# Patient Record
Sex: Female | Born: 1965
Health system: Southern US, Community
[De-identification: ages and names within clinical notes are randomized; demographics above are authoritative.]

## PROBLEM LIST (undated history)

## (undated) DIAGNOSIS — I10 Essential (primary) hypertension: Secondary | ICD-10-CM

## (undated) DIAGNOSIS — B20 Human immunodeficiency virus [HIV] disease: Secondary | ICD-10-CM

## (undated) DIAGNOSIS — C50919 Malignant neoplasm of unspecified site of unspecified female breast: Secondary | ICD-10-CM

## (undated) DIAGNOSIS — T7840XA Allergy, unspecified, initial encounter: Secondary | ICD-10-CM

## (undated) DIAGNOSIS — F1910 Other psychoactive substance abuse, uncomplicated: Secondary | ICD-10-CM

## (undated) DIAGNOSIS — E785 Hyperlipidemia, unspecified: Secondary | ICD-10-CM

## (undated) DIAGNOSIS — N879 Dysplasia of cervix uteri, unspecified: Secondary | ICD-10-CM

## (undated) DIAGNOSIS — D219 Benign neoplasm of connective and other soft tissue, unspecified: Secondary | ICD-10-CM

## (undated) DIAGNOSIS — G4730 Sleep apnea, unspecified: Secondary | ICD-10-CM

## (undated) DIAGNOSIS — G4700 Insomnia, unspecified: Secondary | ICD-10-CM

## (undated) DIAGNOSIS — K219 Gastro-esophageal reflux disease without esophagitis: Secondary | ICD-10-CM

## (undated) DIAGNOSIS — Z21 Asymptomatic human immunodeficiency virus [HIV] infection status: Secondary | ICD-10-CM

## (undated) DIAGNOSIS — N750 Cyst of Bartholin's gland: Secondary | ICD-10-CM

## (undated) DIAGNOSIS — K2270 Barrett's esophagus without dysplasia: Secondary | ICD-10-CM

## (undated) DIAGNOSIS — K589 Irritable bowel syndrome without diarrhea: Secondary | ICD-10-CM

## (undated) HISTORY — DX: Barrett's esophagus without dysplasia: K22.70

## (undated) HISTORY — DX: Gastro-esophageal reflux disease without esophagitis: K21.9

## (undated) HISTORY — PX: TUBAL LIGATION: SHX77

## (undated) HISTORY — DX: Dysplasia of cervix uteri, unspecified: N87.9

## (undated) HISTORY — DX: Benign neoplasm of connective and other soft tissue, unspecified: D21.9

## (undated) HISTORY — DX: Human immunodeficiency virus (HIV) disease: B20

## (undated) HISTORY — DX: Asymptomatic human immunodeficiency virus (hiv) infection status: Z21

## (undated) HISTORY — DX: Essential (primary) hypertension: I10

## (undated) HISTORY — DX: Cyst of Bartholin's gland: N75.0

## (undated) HISTORY — DX: Insomnia, unspecified: G47.00

## (undated) HISTORY — PX: ANAL SPHINCTEROTOMY: SHX1140

## (undated) HISTORY — PX: APPENDECTOMY: SHX54

## (undated) HISTORY — DX: Other psychoactive substance abuse, uncomplicated: F19.10

## (undated) HISTORY — DX: Allergy, unspecified, initial encounter: T78.40XA

## (undated) HISTORY — DX: Irritable bowel syndrome, unspecified: K58.9

## (undated) HISTORY — DX: Hyperlipidemia, unspecified: E78.5

## (undated) HISTORY — DX: Sleep apnea, unspecified: G47.30

---

## 1988-01-27 HISTORY — PX: CHOLECYSTECTOMY: SHX55

## 1998-01-26 HISTORY — PX: LEEP: SHX91

## 1998-11-28 ENCOUNTER — Emergency Department (HOSPITAL_COMMUNITY): Admission: EM | Admit: 1998-11-28 | Discharge: 1998-11-28 | Payer: Self-pay | Admitting: Emergency Medicine

## 2002-09-27 ENCOUNTER — Other Ambulatory Visit: Admission: RE | Admit: 2002-09-27 | Discharge: 2002-09-27 | Payer: Self-pay | Admitting: Family Medicine

## 2002-09-27 ENCOUNTER — Other Ambulatory Visit: Admission: RE | Admit: 2002-09-27 | Discharge: 2002-09-27 | Payer: Self-pay | Admitting: *Deleted

## 2003-01-27 DIAGNOSIS — N879 Dysplasia of cervix uteri, unspecified: Secondary | ICD-10-CM

## 2003-01-27 HISTORY — DX: Dysplasia of cervix uteri, unspecified: N87.9

## 2003-04-10 ENCOUNTER — Other Ambulatory Visit: Admission: RE | Admit: 2003-04-10 | Discharge: 2003-04-10 | Payer: Self-pay | Admitting: Obstetrics and Gynecology

## 2003-09-27 HISTORY — PX: ABDOMINAL HYSTERECTOMY: SHX81

## 2003-10-09 ENCOUNTER — Observation Stay (HOSPITAL_COMMUNITY): Admission: RE | Admit: 2003-10-09 | Discharge: 2003-10-10 | Payer: Self-pay | Admitting: Obstetrics and Gynecology

## 2003-10-09 ENCOUNTER — Encounter (INDEPENDENT_AMBULATORY_CARE_PROVIDER_SITE_OTHER): Payer: Self-pay | Admitting: Specialist

## 2003-10-09 ENCOUNTER — Encounter: Payer: Self-pay | Admitting: Infectious Diseases

## 2003-10-09 ENCOUNTER — Encounter (INDEPENDENT_AMBULATORY_CARE_PROVIDER_SITE_OTHER): Payer: Self-pay | Admitting: *Deleted

## 2004-03-28 ENCOUNTER — Encounter (INDEPENDENT_AMBULATORY_CARE_PROVIDER_SITE_OTHER): Payer: Self-pay | Admitting: *Deleted

## 2004-03-28 LAB — CONVERTED CEMR LAB: CD4 Count: 471 microliters

## 2004-04-22 ENCOUNTER — Ambulatory Visit (HOSPITAL_COMMUNITY): Admission: RE | Admit: 2004-04-22 | Discharge: 2004-04-22 | Payer: Self-pay | Admitting: Infectious Diseases

## 2004-04-22 ENCOUNTER — Ambulatory Visit: Payer: Self-pay | Admitting: Infectious Diseases

## 2004-05-15 ENCOUNTER — Ambulatory Visit: Payer: Self-pay | Admitting: Infectious Diseases

## 2004-05-21 ENCOUNTER — Other Ambulatory Visit: Admission: RE | Admit: 2004-05-21 | Discharge: 2004-05-21 | Payer: Self-pay | Admitting: Obstetrics and Gynecology

## 2004-10-17 ENCOUNTER — Ambulatory Visit: Payer: Self-pay | Admitting: Infectious Diseases

## 2004-11-17 ENCOUNTER — Ambulatory Visit: Payer: Self-pay | Admitting: Infectious Diseases

## 2004-11-17 ENCOUNTER — Ambulatory Visit (HOSPITAL_COMMUNITY): Admission: RE | Admit: 2004-11-17 | Discharge: 2004-11-17 | Payer: Self-pay | Admitting: Infectious Diseases

## 2004-11-17 ENCOUNTER — Encounter (INDEPENDENT_AMBULATORY_CARE_PROVIDER_SITE_OTHER): Payer: Self-pay | Admitting: *Deleted

## 2004-11-17 LAB — CONVERTED CEMR LAB
CD4 Count: 360 microliters
HIV 1 RNA Quant: 399 copies/mL

## 2004-12-15 ENCOUNTER — Ambulatory Visit: Payer: Self-pay | Admitting: Infectious Diseases

## 2005-03-19 ENCOUNTER — Encounter (INDEPENDENT_AMBULATORY_CARE_PROVIDER_SITE_OTHER): Payer: Self-pay | Admitting: *Deleted

## 2005-03-19 ENCOUNTER — Ambulatory Visit: Payer: Self-pay | Admitting: Infectious Diseases

## 2005-04-09 ENCOUNTER — Ambulatory Visit: Payer: Self-pay | Admitting: Infectious Diseases

## 2005-10-07 ENCOUNTER — Encounter: Admission: RE | Admit: 2005-10-07 | Discharge: 2005-10-07 | Payer: Self-pay | Admitting: Internal Medicine

## 2005-10-07 ENCOUNTER — Ambulatory Visit: Payer: Self-pay | Admitting: Internal Medicine

## 2005-10-07 ENCOUNTER — Encounter (INDEPENDENT_AMBULATORY_CARE_PROVIDER_SITE_OTHER): Payer: Self-pay | Admitting: *Deleted

## 2005-10-07 LAB — CONVERTED CEMR LAB
CD4 Count: 480 microliters
HIV 1 RNA Quant: 49 copies/mL

## 2005-10-22 ENCOUNTER — Ambulatory Visit: Payer: Self-pay | Admitting: Infectious Diseases

## 2005-12-05 DIAGNOSIS — B20 Human immunodeficiency virus [HIV] disease: Secondary | ICD-10-CM | POA: Insufficient documentation

## 2005-12-05 DIAGNOSIS — I10 Essential (primary) hypertension: Secondary | ICD-10-CM | POA: Insufficient documentation

## 2006-03-22 ENCOUNTER — Encounter (INDEPENDENT_AMBULATORY_CARE_PROVIDER_SITE_OTHER): Payer: Self-pay | Admitting: *Deleted

## 2006-03-22 LAB — CONVERTED CEMR LAB

## 2006-04-04 ENCOUNTER — Encounter (INDEPENDENT_AMBULATORY_CARE_PROVIDER_SITE_OTHER): Payer: Self-pay | Admitting: *Deleted

## 2006-04-08 ENCOUNTER — Telehealth: Payer: Self-pay | Admitting: Infectious Diseases

## 2006-05-12 ENCOUNTER — Encounter (INDEPENDENT_AMBULATORY_CARE_PROVIDER_SITE_OTHER): Payer: Self-pay | Admitting: *Deleted

## 2006-05-12 ENCOUNTER — Ambulatory Visit: Payer: Self-pay | Admitting: Infectious Diseases

## 2006-05-12 ENCOUNTER — Encounter: Admission: RE | Admit: 2006-05-12 | Discharge: 2006-05-12 | Payer: Self-pay | Admitting: Infectious Diseases

## 2006-05-12 LAB — CONVERTED CEMR LAB
ALT: 15 units/L (ref 0–35)
AST: 21 units/L (ref 0–37)
Basophils Absolute: 0 10*3/uL (ref 0.0–0.1)
Basophils Relative: 0 % (ref 0–1)
CD4 Count: 570 microliters
Calcium: 8.8 mg/dL (ref 8.4–10.5)
Chloride: 104 meq/L (ref 96–112)
Creatinine, Ser: 0.76 mg/dL (ref 0.40–1.20)
Hemoglobin: 13.1 g/dL (ref 12.0–15.0)
MCHC: 33.4 g/dL (ref 30.0–36.0)
Monocytes Absolute: 0.4 10*3/uL (ref 0.2–0.7)
Neutro Abs: 3 10*3/uL (ref 1.7–7.7)
Platelets: 218 10*3/uL (ref 150–400)
RDW: 13.6 % (ref 11.5–14.0)
Total Bilirubin: 0.2 mg/dL — ABNORMAL LOW (ref 0.3–1.2)

## 2006-05-27 ENCOUNTER — Ambulatory Visit: Payer: Self-pay | Admitting: Infectious Diseases

## 2006-06-01 ENCOUNTER — Telehealth: Payer: Self-pay | Admitting: Infectious Diseases

## 2006-06-04 ENCOUNTER — Ambulatory Visit: Payer: Self-pay | Admitting: Internal Medicine

## 2006-06-04 DIAGNOSIS — R7989 Other specified abnormal findings of blood chemistry: Secondary | ICD-10-CM | POA: Insufficient documentation

## 2006-06-16 ENCOUNTER — Ambulatory Visit (HOSPITAL_COMMUNITY): Admission: RE | Admit: 2006-06-16 | Discharge: 2006-06-16 | Payer: Self-pay | Admitting: Internal Medicine

## 2006-06-16 ENCOUNTER — Encounter: Payer: Self-pay | Admitting: Infectious Diseases

## 2006-06-16 ENCOUNTER — Ambulatory Visit: Payer: Self-pay | Admitting: Internal Medicine

## 2006-06-16 DIAGNOSIS — N644 Mastodynia: Secondary | ICD-10-CM | POA: Insufficient documentation

## 2006-06-16 DIAGNOSIS — R42 Dizziness and giddiness: Secondary | ICD-10-CM | POA: Insufficient documentation

## 2006-06-16 DIAGNOSIS — R079 Chest pain, unspecified: Secondary | ICD-10-CM | POA: Insufficient documentation

## 2006-09-13 ENCOUNTER — Emergency Department (HOSPITAL_COMMUNITY): Admission: EM | Admit: 2006-09-13 | Discharge: 2006-09-13 | Payer: Self-pay | Admitting: Emergency Medicine

## 2006-10-01 ENCOUNTER — Telehealth: Payer: Self-pay | Admitting: Infectious Diseases

## 2006-10-12 ENCOUNTER — Telehealth: Payer: Self-pay | Admitting: Infectious Diseases

## 2006-11-11 ENCOUNTER — Telehealth: Payer: Self-pay | Admitting: Infectious Diseases

## 2006-12-02 ENCOUNTER — Ambulatory Visit (HOSPITAL_BASED_OUTPATIENT_CLINIC_OR_DEPARTMENT_OTHER): Admission: RE | Admit: 2006-12-02 | Discharge: 2006-12-02 | Payer: Self-pay | Admitting: General Surgery

## 2006-12-13 ENCOUNTER — Telehealth: Payer: Self-pay | Admitting: Internal Medicine

## 2007-01-25 ENCOUNTER — Encounter (INDEPENDENT_AMBULATORY_CARE_PROVIDER_SITE_OTHER): Payer: Self-pay | Admitting: *Deleted

## 2007-01-25 ENCOUNTER — Encounter: Payer: Self-pay | Admitting: Infectious Diseases

## 2007-01-27 HISTORY — PX: COLONOSCOPY: SHX174

## 2007-03-24 ENCOUNTER — Encounter: Admission: RE | Admit: 2007-03-24 | Discharge: 2007-03-24 | Payer: Self-pay | Admitting: Infectious Diseases

## 2007-03-24 ENCOUNTER — Ambulatory Visit: Payer: Self-pay | Admitting: Infectious Diseases

## 2007-03-24 LAB — CONVERTED CEMR LAB
ALT: 18 units/L (ref 0–35)
Alkaline Phosphatase: 131 units/L — ABNORMAL HIGH (ref 39–117)
Basophils Absolute: 0 10*3/uL (ref 0.0–0.1)
Basophils Relative: 1 % (ref 0–1)
Bilirubin Urine: NEGATIVE
CO2: 26 meq/L (ref 19–32)
Chlamydia, Swab/Urine, PCR: NEGATIVE
Cholesterol: 228 mg/dL — ABNORMAL HIGH (ref 0–200)
Creatinine, Ser: 0.62 mg/dL (ref 0.40–1.20)
Eosinophils Absolute: 0.2 10*3/uL (ref 0.0–0.7)
Eosinophils Relative: 2 % (ref 0–5)
HIV 1 RNA Quant: <50 copies/mL (ref <50)
HIV-1 RNA Quant, Log: <1.7 (ref <1.70)
Hemoglobin, Urine: NEGATIVE
LDL Cholesterol: 154 mg/dL — ABNORMAL HIGH (ref 0–99)
Lymphocytes Relative: 34 % (ref 12–46)
Lymphs Abs: 2.8 10*3/uL (ref 0.7–4.0)
Monocytes Relative: 4 % (ref 3–12)
Neutro Abs: 5 10*3/uL (ref 1.7–7.7)
Neutrophils Relative %: 60 % (ref 43–77)
Protein, ur: NEGATIVE mg/dL
RBC: 4.28 M/uL (ref 3.87–5.11)
Sodium: 141 meq/L (ref 135–145)
Total Bilirubin: 0.3 mg/dL (ref 0.3–1.2)
Total CHOL/HDL Ratio: 5.1
Total Protein: 7.3 g/dL (ref 6.0–8.3)
Urine Glucose: NEGATIVE mg/dL
Urobilinogen, UA: 0.2 (ref 0.0–1.0)
VLDL: 29 mg/dL (ref 0–40)
WBC: 8.3 10*3/uL (ref 4.0–10.5)

## 2007-04-07 ENCOUNTER — Ambulatory Visit: Payer: Self-pay | Admitting: Infectious Diseases

## 2007-04-07 DIAGNOSIS — F4323 Adjustment disorder with mixed anxiety and depressed mood: Secondary | ICD-10-CM | POA: Insufficient documentation

## 2007-04-08 ENCOUNTER — Telehealth: Payer: Self-pay | Admitting: Licensed Clinical Social Worker

## 2007-04-11 ENCOUNTER — Encounter: Payer: Self-pay | Admitting: Licensed Clinical Social Worker

## 2007-06-14 ENCOUNTER — Encounter (INDEPENDENT_AMBULATORY_CARE_PROVIDER_SITE_OTHER): Payer: Self-pay | Admitting: *Deleted

## 2007-10-20 ENCOUNTER — Ambulatory Visit: Payer: Self-pay | Admitting: Infectious Diseases

## 2007-10-20 LAB — CONVERTED CEMR LAB
ALT: 11 units/L (ref 0–35)
Albumin: 4.3 g/dL (ref 3.5–5.2)
Basophils Absolute: 0 10*3/uL (ref 0.0–0.1)
Blood Glucose, Fingerstick: 96
CO2: 27 meq/L (ref 19–32)
Calcium: 9.4 mg/dL (ref 8.4–10.5)
Chloride: 103 meq/L (ref 96–112)
Cholesterol: 225 mg/dL — ABNORMAL HIGH (ref 0–200)
HIV-1 RNA Quant, Log: <1.7 (ref <1.70)
Hgb A1c MFr Bld: 5.9 %
Lymphocytes Relative: 37 % (ref 12–46)
Lymphs Abs: 3 10*3/uL (ref 0.7–4.0)
Neutro Abs: 4.5 10*3/uL (ref 1.7–7.7)
Platelets: 246 10*3/uL (ref 150–400)
Potassium: 3.9 meq/L (ref 3.5–5.3)
RDW: 13 % (ref 11.5–15.5)
Sodium: 139 meq/L (ref 135–145)
Total Protein: 7.5 g/dL (ref 6.0–8.3)
VLDL: 15 mg/dL (ref 0–40)
WBC: 8.1 10*3/uL (ref 4.0–10.5)

## 2008-03-07 ENCOUNTER — Ambulatory Visit: Payer: Self-pay | Admitting: Infectious Diseases

## 2008-03-07 LAB — CONVERTED CEMR LAB
Albumin: 4.2 g/dL (ref 3.5–5.2)
CO2: 23 meq/L (ref 19–32)
Eosinophils Relative: 1 % (ref 0–5)
Glucose, Bld: 97 mg/dL (ref 70–99)
HCT: 38.1 % (ref 36.0–46.0)
HIV 1 RNA Quant: <48 copies/mL (ref <48)
Hemoglobin: 12.3 g/dL (ref 12.0–15.0)
Lymphocytes Relative: 35 % (ref 12–46)
Lymphs Abs: 4 10*3/uL (ref 0.7–4.0)
Platelets: 229 10*3/uL (ref 150–400)
Potassium: 3.6 meq/L (ref 3.5–5.3)
RBC: 4.05 M/uL (ref 3.87–5.11)
Sodium: 139 meq/L (ref 135–145)
Total Bilirubin: 0.3 mg/dL (ref 0.3–1.2)
Total Protein: 7.4 g/dL (ref 6.0–8.3)
WBC: 11.5 10*3/uL — ABNORMAL HIGH (ref 4.0–10.5)

## 2008-03-21 ENCOUNTER — Encounter (INDEPENDENT_AMBULATORY_CARE_PROVIDER_SITE_OTHER): Payer: Self-pay | Admitting: *Deleted

## 2008-03-22 ENCOUNTER — Ambulatory Visit: Payer: Self-pay | Admitting: Infectious Diseases

## 2008-09-06 ENCOUNTER — Encounter: Admission: RE | Admit: 2008-09-06 | Discharge: 2008-09-06 | Payer: Self-pay | Admitting: Obstetrics and Gynecology

## 2008-12-27 ENCOUNTER — Ambulatory Visit: Payer: Self-pay | Admitting: Infectious Diseases

## 2008-12-27 LAB — CONVERTED CEMR LAB
BUN: 10 mg/dL (ref 6–23)
CO2: 26 meq/L (ref 19–32)
Calcium: 9.1 mg/dL (ref 8.4–10.5)
Chloride: 104 meq/L (ref 96–112)
Cholesterol: 254 mg/dL — ABNORMAL HIGH (ref 0–200)
Creatinine, Ser: 0.93 mg/dL (ref 0.40–1.20)
Eosinophils Relative: 2 % (ref 0–5)
Glucose, Bld: 97 mg/dL (ref 70–99)
HCT: 38.4 % (ref 36.0–46.0)
HDL: 36 mg/dL — ABNORMAL LOW (ref >39)
Hemoglobin: 13.2 g/dL (ref 12.0–15.0)
Hgb A1c MFr Bld: 5.3 %
LDL Cholesterol: 178 mg/dL — ABNORMAL HIGH (ref 0–99)
Lymphocytes Relative: 44 % (ref 12–46)
Lymphs Abs: 3.8 10*3/uL (ref 0.7–4.0)
Monocytes Absolute: 0.5 10*3/uL (ref 0.1–1.0)
Triglycerides: 202 mg/dL — ABNORMAL HIGH (ref <150)
VLDL: 40 mg/dL (ref 0–40)
WBC: 8.7 10*3/uL (ref 4.0–10.5)

## 2009-02-07 ENCOUNTER — Ambulatory Visit: Payer: Self-pay | Admitting: Infectious Diseases

## 2009-02-07 DIAGNOSIS — E785 Hyperlipidemia, unspecified: Secondary | ICD-10-CM | POA: Insufficient documentation

## 2009-03-18 ENCOUNTER — Encounter: Payer: Self-pay | Admitting: Infectious Diseases

## 2009-04-08 ENCOUNTER — Telehealth (INDEPENDENT_AMBULATORY_CARE_PROVIDER_SITE_OTHER): Payer: Self-pay | Admitting: *Deleted

## 2009-08-26 ENCOUNTER — Telehealth (INDEPENDENT_AMBULATORY_CARE_PROVIDER_SITE_OTHER): Payer: Self-pay | Admitting: *Deleted

## 2009-09-16 ENCOUNTER — Ambulatory Visit: Payer: Self-pay | Admitting: Infectious Diseases

## 2009-09-16 LAB — CONVERTED CEMR LAB
AST: 24 units/L (ref 0–37)
Alkaline Phosphatase: 115 units/L (ref 39–117)
BUN: 8 mg/dL (ref 6–23)
Basophils Relative: 0 % (ref 0–1)
Creatinine, Ser: 0.7 mg/dL (ref 0.40–1.20)
Eosinophils Absolute: 0.3 10*3/uL (ref 0.0–0.7)
Eosinophils Relative: 2 % (ref 0–5)
Glucose, Bld: 106 mg/dL — ABNORMAL HIGH (ref 70–99)
HCT: 39.8 % (ref 36.0–46.0)
HIV-1 RNA Quant, Log: <1.68 (ref <1.68)
MCHC: 33.2 g/dL (ref 30.0–36.0)
MCV: 94.1 fL (ref 78.0–100.0)
Monocytes Relative: 5 % (ref 3–12)
Neutrophils Relative %: 60 % (ref 43–77)
Potassium: 3.9 meq/L (ref 3.5–5.3)
RBC: 4.23 M/uL (ref 3.87–5.11)
Total Bilirubin: 0.2 mg/dL — ABNORMAL LOW (ref 0.3–1.2)

## 2009-10-03 ENCOUNTER — Ambulatory Visit: Payer: Self-pay | Admitting: Infectious Diseases

## 2009-10-03 LAB — CONVERTED CEMR LAB
Cholesterol: 192 mg/dL (ref 0–200)
HDL: 42 mg/dL (ref >39)
Total CHOL/HDL Ratio: 4.6
Triglycerides: 199 mg/dL — ABNORMAL HIGH (ref <150)
VLDL: 40 mg/dL (ref 0–40)

## 2009-10-24 ENCOUNTER — Encounter: Payer: Self-pay | Admitting: Infectious Diseases

## 2010-02-27 NOTE — Progress Notes (Signed)
Summary: cold symptoms x 1 week  Phone Note Call from Patient Call back at Work Phone [phone]   Caller: Patient Reason for Call: Acute Illness Summary of Call: Sick x 1 week.  Started out with nasal congestion and slight fever.  Went out-of-town on 04/02/09 on an airplane.  Pt. now has copius clear to yellowish nasal drainage, productive cough (clear to yellow sputum), no fever and no sore throat.  Has tried several OTC multisymptom products.   Pt. is still out-of-town, coming home on airplane tomorrow.  RN advised 1) Robitussin for cough, 2) OTC claritin for nasal symptoms and 3) Saline Nasal spray for nasal symptoms.  Pt. verbalized understanding. Jennet Maduro RN  April 08, 2009 4:02 PM  Initial call taken by: Jennet Maduro RN,  April 08, 2009 3:59 PM

## 2010-02-27 NOTE — Progress Notes (Signed)
Summary: refill request for Ambien  Phone Note Call from Patient   Caller: Patient Reason for Call: Refill Medication Summary of Call: Patient called requesting refill for Ambien.  Please call into [facility] on [place], [place]. Initial call taken by: Paulo Fruit  BS,CPht II,MPH,  August 26, 2009 12:08 PM    Prescriptions: AMBIEN CR 12.5 MG TBCR (ZOLPIDEM TARTRATE) Take 1 tablet by mouth at bedtime  #30 x 1   Entered by:   Paulo Fruit  BS,CPht II,MPH   Authorized by:   [name] MD   Signed by:   Paulo Fruit  BS,CPht II,MPH on 08/26/2009   Method used:   Telephoned to ...       [facility] [address] #[id number]* (retail)       [address] [place], [place]  [address]       Ph: [phone]       Fax: [phone]   RxID:   [id number]  Left message of approval on pharmacy physician line. Paulo Fruit  BS,CPht II,MPH  August 26, 2009 12:41 PM

## 2010-02-27 NOTE — Letter (Signed)
Summary: [employer]: Review  [employer]: Review   Imported By: Florinda Marker 04/05/2009 15:07:04  _____________________________________________________________________  External Attachment:    Type:   Image     Comment:   External Document

## 2010-02-27 NOTE — Medication Information (Signed)
Summary: [facility]   Imported By: Florinda Marker 10/30/2009 14:16:12  _____________________________________________________________________  External Attachment:    Type:   Image     Comment:   External Document

## 2010-02-27 NOTE — Assessment & Plan Note (Signed)
Summary: Amy Hernandez   CC:  f/u labs.  Preventive Screening-Counseling & Management  Alcohol-Tobacco     Smoking Status: current     Packs/Day: 1/2   Current Allergies (reviewed today): ! CODEINE Additional History Menstrual Status:  hysterectomy  Vital Signs:  Patient profile:   45 year old female Menstrual status:  hysterectomy Height:      62 inches (157.48 cm) Weight:      192.50 pounds (87.50 kg) BMI:     35.34 Temp:     98.2 degrees F (36.78 degrees C) Pulse rate:   77 / minute BP sitting:   142 / 90  (left arm)  Vitals Entered By: Starleen Arms CMA (February 07, 2009 12:08 PM) CC: f/u labs Is Patient Diabetic? No Pain Assessment Patient in pain? no      Nutritional Status BMI of > 30 = obese Nutritional Status Detail nl  Does patient need assistance? Functional Status Self care Ambulation Normal     Menstrual Status hysterectomy Last PAP Result hysterectomy due to CIN 1 and fibroids    Medications Added to Medication List This Visit: 1)  Proair Hfa 108 (90 Base) Mcg/act Aers (Albuterol sulfate) .... Use two times a day 2 puffs prn 2)  Simvastatin 20 Mg Tabs (Simvastatin) .... Take 1 tablet by mouth at bedtime  Other Orders: Future Orders: T-CBC w/Diff ([id number]) ... 06/07/2009 T-CD4SP ([facility]) (CD4SP) ... 06/07/2009 T-Comprehensive Metabolic Panel [id number]) ... 06/07/2009 T-HIV Viral Load [phone]) ... 06/07/2009  Patient Instructions: 1)  Please schedule a follow-up appointment in 3-4 months. 2)  Be sure to return for lab work one (2) week before your next appointment as scheduled. Prescriptions: SIMVASTATIN 20 MG TABS (SIMVASTATIN) Take 1 tablet by mouth at bedtime  #31 x prn   Entered by:   Jennet Maduro RN   Authorized by:   [name] MD   Signed by:   Jennet Maduro RN on 04/04/2009   Method used:   Telephoned to ...       [facility] [address] #[id number]* (retail)       [address] [place], [place]  [address]    Ph: [phone]       Fax: [phone]   RxID:   [id number] LISINOPRIL-HYDROCHLOROTHIAZIDE 20-25 MG TABS (LISINOPRIL-HYDROCHLOROTHIAZIDE) Take 1 tablet by mouth once a day  #30 Tablet x 6   Entered by:   Starleen Arms CMA   Authorized by:   [name] MD   Signed by:   Starleen Arms CMA on 02/07/2009   Method used:   Telephoned to ...       [facility] [address] #[id number]* (retail)       [address] [place], [place]  [address]       Ph: [phone]       Fax: [phone]   RxID:   [id number] ATRIPLA 600-200-300 MG TABS (EFAVIRENZ-EMTRICITAB-TENOFOVIR) Take 1 tablet by mouth at bedtime  #30.0 Each x 12   Entered by:   Starleen Arms CMA   Authorized by:   [name] MD   Signed by:   Starleen Arms CMA on 02/07/2009   Method used:   Telephoned to ...       [facility] [address] #[id number]* (retail)       [address] [place], [place]  [address]       Ph: [phone]  Fax: [phone]   RxID:   [id number] AMBIEN CR 12.5 MG TBCR (ZOLPIDEM TARTRATE) Take 1 tablet by mouth at bedtime  #30 x 2   Entered by:   Starleen Arms CMA   Authorized by:   [name] MD   Signed by:   Starleen Arms CMA on 02/07/2009   Method used:   Telephoned to ...       [facility] [address] #[id number]* (retail)       [address] [place], [place]  [address]       Ph: [phone]       Fax: [phone]   RxID:   [id number] PROAIR HFA 108 (90 BASE) MCG/ACT AERS (ALBUTEROL SULFATE) use two times a day 2 puffs prn  #1 x 11   Entered by:   Starleen Arms CMA   Authorized by:   [name] MD   Signed by:   Starleen Arms CMA on 02/07/2009   Method used:   Electronically to        [facility] #[id number]* (retail)       [address] [place], [place]  [address]       Ph: [phone]       Fax: [phone]   RxID:   [id number]  Process Orders Check Orders Results:     [facility]: ABN not required  for this insurance Tests Sent for requisitioning (April 18, 2009 2:12 PM):     06/07/2009: [facility] -- T-CBC w/Diff [[id number]] (signed)     06/07/2009: [facility] -- T-Comprehensive Metabolic Panel [[id number]] (signed)     06/07/2009: [facility] -- T-HIV Viral Load [phone] (signed)   Process Orders Check Orders Results:     [facility]: ABN not required for this insurance Tests Sent for requisitioning (April 18, 2009 2:12 PM):     06/07/2009: [facility] -- T-CBC w/Diff [[id number]] (signed)     06/07/2009: [facility] -- T-Comprehensive Metabolic Panel [[id number]] (signed)     06/07/2009: [facility] -- T-HIV Viral Load [phone] (signed)

## 2010-04-01 ENCOUNTER — Other Ambulatory Visit: Payer: Self-pay | Admitting: Infectious Diseases

## 2010-04-01 ENCOUNTER — Encounter: Payer: Self-pay | Admitting: Infectious Diseases

## 2010-04-01 ENCOUNTER — Other Ambulatory Visit (INDEPENDENT_AMBULATORY_CARE_PROVIDER_SITE_OTHER): Payer: Managed Care, Other (non HMO)

## 2010-04-01 DIAGNOSIS — B20 Human immunodeficiency virus [HIV] disease: Secondary | ICD-10-CM

## 2010-04-01 LAB — CONVERTED CEMR LAB
Albumin: 4.4 g/dL (ref 3.5–5.2)
BUN: 10 mg/dL (ref 6–23)
Basophils Absolute: 0 10*3/uL (ref 0.0–0.1)
Basophils Relative: 0 % (ref 0–1)
Calcium: 9.3 mg/dL (ref 8.4–10.5)
Chloride: 98 meq/L (ref 96–112)
Glucose, Bld: 107 mg/dL — ABNORMAL HIGH (ref 70–99)
HIV-1 RNA Quant, Log: <1.3 (ref <1.30)
Hemoglobin: 13.6 g/dL (ref 12.0–15.0)
Lymphocytes Relative: 38 % (ref 12–46)
MCHC: 33.9 g/dL (ref 30.0–36.0)
Monocytes Absolute: 0.5 10*3/uL (ref 0.1–1.0)
Monocytes Relative: 5 % (ref 3–12)
Neutro Abs: 5 10*3/uL (ref 1.7–7.7)
Neutrophils Relative %: 53 % (ref 43–77)
Potassium: 3.5 meq/L (ref 3.5–5.3)
RBC: 4.27 M/uL (ref 3.87–5.11)
RDW: 13.2 % (ref 11.5–15.5)
Sodium: 139 meq/L (ref 135–145)
Total Protein: 7.4 g/dL (ref 6.0–8.3)

## 2010-04-03 ENCOUNTER — Other Ambulatory Visit: Payer: Self-pay | Admitting: Obstetrics and Gynecology

## 2010-04-03 ENCOUNTER — Other Ambulatory Visit: Payer: Self-pay

## 2010-04-03 NOTE — Assessment & Plan Note (Signed)
Summary: fukam   CC:  follow-up visit.  Preventive Screening-Counseling & Management  Alcohol-Tobacco     Alcohol drinks/day: 0     Smoking Status: current     Packs/Day: 1/2     Passive Smoke Exposure: no  Caffeine-Diet-Exercise     Caffeine use/day: sodas     Does Patient Exercise: yes     Type of exercise: walk at work     Times/week: 5  Safety-Violence-Falls     Seat Belt Use: yes   Current Allergies (reviewed today): ! CODEINE Vital Signs:  Patient profile:   45 year old female Menstrual status:  hysterectomy Height:      62 inches (157.48 cm) Weight:      199.12 pounds (90.51 kg) BMI:     36.55 Temp:     98.2 degrees F (36.78 degrees C) oral Pulse rate:   90 / minute BP sitting:   136 / 93  (right arm)  Vitals Entered By: Baxter Hire) (October 03, 2009 3:49 PM) CC: follow-up visit Pain Assessment Patient in pain? no      Nutritional Status BMI of > 30 = obese Nutritional Status Detail appetite is good per patient  Have you ever been in a relationship where you felt threatened, hurt or afraid?No   Does patient need assistance? Functional Status Self care Ambulation Normal    Other Orders: T-Lipid Profile ([id number]) Est. Patient Level IV ([address]) Future Orders: T-CBC w/Diff ([id number]) ... 01/30/2010 T-Comprehensive Metabolic Panel [phone]) ... 01/30/2010 T-HIV Viral Load ([id number]) ... 01/30/2010 T-CD4SP ([facility]) (CD4SP) ... 01/30/2010  Patient Instructions: 1)  Please schedule a follow-up appointment in 6 months. 2)  Be sure to return for lab work one (2) weeks before your next appointment as scheduled.  Process Orders Check Orders Results:     [facility]: ABN not required for this insurance Tests Sent for requisitioning (March 25, 2010 10:18 AM):     01/30/2010: [facility] -- T-CBC w/Diff [[id number]] (signed)     01/30/2010: [facility] -- T-Comprehensive  Metabolic Panel [[id number]] (signed)     01/30/2010: [facility] -- T-HIV Viral Load [phone] (signed)     10/03/2009: [facility] -- T-Lipid Profile [phone] (signed)

## 2010-04-17 ENCOUNTER — Ambulatory Visit: Payer: Self-pay | Admitting: Infectious Diseases

## 2010-04-28 ENCOUNTER — Other Ambulatory Visit: Payer: Self-pay | Admitting: Licensed Clinical Social Worker

## 2010-04-28 DIAGNOSIS — G4700 Insomnia, unspecified: Secondary | ICD-10-CM

## 2010-04-28 MED ORDER — ZOLPIDEM TARTRATE ER 12.5 MG PO TBCR: 12.5000 mg | EXTENDED_RELEASE_TABLET | Freq: Every evening | ORAL | Status: DC | PRN

## 2010-04-29 LAB — T-HELPER CELL (CD4) - (RCID CLINIC ONLY)
CD4 % Helper T Cell: 26 % — ABNORMAL LOW (ref 33–55)
CD4 T Cell Abs: 850 uL (ref 400–2700)

## 2010-05-06 ENCOUNTER — Other Ambulatory Visit: Payer: Self-pay | Admitting: *Deleted

## 2010-05-06 DIAGNOSIS — G4700 Insomnia, unspecified: Secondary | ICD-10-CM

## 2010-05-06 MED ORDER — ZOLPIDEM TARTRATE ER 12.5 MG PO TBCR: 12.5000 mg | EXTENDED_RELEASE_TABLET | Freq: Every evening | ORAL | Status: DC | PRN

## 2010-05-06 NOTE — Telephone Encounter (Signed)
Called the refill of ambien in

## 2010-05-07 ENCOUNTER — Other Ambulatory Visit: Payer: Self-pay | Admitting: Infectious Diseases

## 2010-05-07 DIAGNOSIS — E785 Hyperlipidemia, unspecified: Secondary | ICD-10-CM

## 2010-05-08 ENCOUNTER — Other Ambulatory Visit: Payer: Self-pay | Admitting: *Deleted

## 2010-05-08 ENCOUNTER — Encounter: Payer: Self-pay | Admitting: Infectious Diseases

## 2010-05-08 ENCOUNTER — Ambulatory Visit (INDEPENDENT_AMBULATORY_CARE_PROVIDER_SITE_OTHER): Payer: Managed Care, Other (non HMO) | Admitting: Infectious Diseases

## 2010-05-08 VITALS — BP 127/88 | HR 86 | Temp 97.9°F | Ht 62.5 in | Wt 196.4 lb

## 2010-05-08 DIAGNOSIS — Z79899 Other long term (current) drug therapy: Secondary | ICD-10-CM

## 2010-05-08 DIAGNOSIS — B20 Human immunodeficiency virus [HIV] disease: Secondary | ICD-10-CM

## 2010-05-08 MED ORDER — EFAVIRENZ-EMTRICITAB-TENOFOVIR 600-200-300 MG PO TABS: 1.0000 | ORAL_TABLET | Freq: Every day | ORAL | Status: DC

## 2010-05-08 NOTE — Progress Notes (Signed)
  Subjective:    Patient ID: Amy Hernandez, female    DOB: [birth year], 45 y.o.   MRN: [id number]  HPIToni has been compliant with her HIV meds and HTN meds and evaluation today is excellent with HIV suppressed and CD4 counts  >1000. She has no complaints.    Review of Systems  Constitutional: Negative.   HENT: Negative.   Respiratory: Negative.   Cardiovascular: Negative.   Gastrointestinal: Negative.        Objective:   Physical Exam  Constitutional: She appears well-developed and well-nourished.  HENT:  Mouth/Throat: Oropharynx is clear and moist.  Eyes: Pupils are equal, round, and reactive to light.  Cardiovascular: Exam reveals no friction rub.   No murmur heard. Pulmonary/Chest: Effort normal and breath sounds normal. Stridor present.  Lymphadenopathy:    She has no cervical adenopathy.          Assessment & Plan:

## 2010-06-10 NOTE — Op Note (Signed)
NAME:  Amy Hernandez, Amy Hernandez                   ACCOUNT NO.:  [id number]   MEDICAL RECORD NO.:  [id number]          PATIENT TYPE:  AMB   LOCATION:  DSC                          FACILITY:  [facility]   PHYSICIAN:  Sharlet Salina T. Hoxworth, M.D.DATE OF BIRTH:  Jan 01, 1966   DATE OF PROCEDURE:  12/02/2006  DATE OF DISCHARGE:                               OPERATIVE REPORT   PREOPERATIVE DIAGNOSES:  Anal fissure.   POSTOPERATIVE DIAGNOSES:  Anal fissure.   SURGICAL PROCEDURES:  Lateral internal anal sphincterotomy.   SURGEON:  Dr. Johna Sheriff.   ANESTHESIA:  General.   BRIEF HISTORY:  Amy Hernandez is a 45 year old female who presents with  persistent anal pain with bowel movements and intermittent bleeding.  A  recent examination in the office has been significant for a posterior  anal fissure and significant hypertrophy of the internal anal sphincter.  This has failed to respond to nonsurgical management and I have  recommended proceeding with lateral internal anal sphincterotomy.  The  nature of the procedure, its indications and risks of bleeding,  infection, nonhealing of the fissure and degrees of incontinence have  been discussed and understood.  She is now brought to the operating room  for this procedure.   DESCRIPTION OF OPERATION:  The patient was brought to the operating  room, placed in the supine position on the operating table and general  anesthesia was induced.  She was carefully placed in lithotomy position,  the perineum sterilely prepped and draped.  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  An anal retractor was placed and in the right  lateral position, the intersphincteric groove was easily palpable and a  small stab incision was made here.  The tip of the hemostat was inserted  into the intersphincteric groove and the hypertrophied portion of the  internal anal sphincter encircled and elevated into the small  stab  incision.  It was then divided with cautery.  There was no significant  bleeding.  This resulted in good relaxation of the internal sphincter to  a normal state.  No other pathology was seen in the anorectum.  The  perianal area was infiltrated with Marcaine with epinephrine.  Sponge,  needle and instrument counts were correct. A clean dry dressing was  applied and the patient taken to recovery in good condition.      Lorne Skeens. Hoxworth, M.D.  Electronically Signed     BTH/MEDQ  D:  12/02/2006  T:  12/02/2006  Job:  [id number]

## 2010-06-13 NOTE — Op Note (Signed)
NAME:  Amy Hernandez, Amy Hernandez                               ACCOUNT NO.:  [id number]   MEDICAL RECORD NO.:  [id number]                   PATIENT TYPE:  OBV   LOCATION:  9399                                 FACILITY:  [facility]   PHYSICIAN:  Carrington Clamp, M.D.              DATE OF BIRTH:  02-28-1965   DATE OF PROCEDURE:  10/09/2003  DATE OF DISCHARGE:                                 OPERATIVE REPORT   PREOPERATIVE DIAGNOSIS:  Menometrorrhagia, and dysfunctional uterine  bleeding as well as cervical intra-epithelial neoplasia II.   POSTOPERATIVE DIAGNOSIS:  Menometrorrhagia, and dysfunctional uterine  bleeding as well as cervical intra-epithelial neoplasia II.   OPERATION PERFORMED:  Laparoscopically assisted vaginal hysterectomy.   SURGEON:  Carrington Clamp, M.D.   ASSISTANT:  Luvenia Redden, M.D.   ANESTHESIA:  General endotracheal.   ESTIMATED BLOOD LOSS:  200 mL.   IV FLUIDS:  2000 mL.   URINE OUTPUT:  Not measured.   COMPLICATIONS:  A small tear of the posterior peritoneum on the right hand  side well away from any bowel, ureter or vessels.  This was secondary to the  Veress insertion.  There was no other damage noted.  This area was very  small, about probably 0.5 cm in size and was hemostatic.   OPERATIVE FINDINGS:  A 7-week size uterus, normal ovaries and some blood in  the pelvis that was believed to be secondary to patient beginning her  menses.  There appeared to be only a small appendiceal stump.  The actual length of  the appendix was not seen.  There were no adhesions seen except for the  omentum to the anterior abdominal wall which was well away from the  umbilical trocar site and this had been checked with a 5 mm scope.   MEDICATIONS:  Lidocaine with epinephrine.   PATHOLOGY:  Uterus and cervix.   COUNTS:  Correct times three.   DESCRIPTION OF PROCEDURE:  After adequate general anesthesia was achieved,  the patient was prepped and draped in the usual sterile fashion  in dorsal  lithotomy position.  The speculum was placed in the vagina and the uterine  manipulator entered into the cervix.  The bladder was emptied with a red  rubber catheter during the procedure.  Attention was then turned to the  abdomen where a 2 cm infraumbilical incision was made with a scalpel and the  Veress needle was into the abdomen without evident of bowel contents or  blood.  The abdomen was insufflated and a 10 mm trocar placed without  complication.  The above findings were noted and two 5 mm trocars were  placed lateral to each of the respective round ligaments.  Each trocar was  placed under direct visualization with the camera.   The uterus cornu on the right hand side was grasped with a blunt forceps and  the tripolar was used to cauterize  the round ligament.  The cautery then  continued with the cornu of the uterus through the tube and then the broad  ligament.  At the level of the internal os and the beginning of the cardinal  ligament, the dissection was stopped.  Cauterization of the utero-ovarian  pedicle was performed to ensure hemostasis.  Attention was then turned to  the left hand side and the same procedure was performed.  Once hemostasis  was achieved of the utero-ovarian pedicles bilaterally, the instruments were  withdrawn from the abdomen but the trocars left.   Attention was then turned to the vagina where the uterine manipulator was  removed from the uterus and duck bill retractor placed.  40 mL of sterile  milk were introduced into the bladder and the red rubber catheter withdrawn.  The cervix was grasped with a pair of Lahey clamps and then injected in  circumferential manner with the lidocaine with epinephrine.  A  circumferential incision was made with a scalpel around the cervix at the  __________ of vagina onto the cervix.  The posterior vagina was then grasped  and an incision was made in the posterior cul-de-sac with the Mayo scissors.  The  long duck bill retractor was placed and dissection was begun anterior  with the Metzenbaum scissors of the vesicouterine fascia to remove the  bladder off of the uterus.  Heaneys were then placed on the uterosacral  ligaments and each pedicle was incised with the Mayo scissors and secured  with a Heaney suture of 0 Vicryl.  Alternating successive bites of the  Heaney were then used to secure the cardinal ligament.  After being incised  with the Mayo scissors, each pedicle was secured with a stitch of 0 Vicryl.  At this point the anterior peritoneum could be felt anteriorly and this was  tended up and entered into with the Metzenbaum scissors.  The bladder was  then retracted out of the way with the round Deaver and dissection continued  until essentially the cardinal ligament had been divided all the way up to  the point where it met the broad ligament.  At this point the uterine  specimen was amputated and each pedicle had been secured with a stitch of 0  Vicryl after being incised with the Mayo scissors.   The anterior peritoneum was then found and secured with a stitch of 2-0  Vicryl.  The 2-0 Vicryl was then pursestringed through the uterosacrals, the  posterior cul-de-sac peritoneum, and the anterior peritoneum again.  This  was cinched down to closed the peritoneum and then the cuff was then closed  with figure-of-eight stitches anterior to posterior of 0 Vicryl.  Once the  cuff was closed, all instruments withdrawn from the vagina and gloves were  changed.   Attention was then turned again to the abdomen and the abdomen was  insufflated and the camera passed again.  There was a small amount of  posterior peritoneum bleeding from the cuff.  This was grasped gently with  the triple polar cautery and briefly cauterized in order to ensure  hemostasis.  The ureter was lateral to this area.  The abdomen was then irrigated well and the above findings again were noted.  All instruments   were then withdrawn from the abdomen and the abdomen desufflated.  The  fascia of the 10 mm trocar site was closed with a figure-of-eight stitch of  2-0 Vicryl.  The 5 mm trocar sites were closed with a subcuticular  stitch  each.  All of the incisions were closed with Dermabond.  The patient  tolerated the procedure well and was returned to the recovery room in stable  condition.                                               Carrington Clamp, M.D.    MH/MEDQ  D:  10/09/2003  T:  10/09/2003  Job:  [id number]

## 2010-06-24 ENCOUNTER — Other Ambulatory Visit: Payer: Self-pay | Admitting: Licensed Clinical Social Worker

## 2010-06-24 DIAGNOSIS — G4700 Insomnia, unspecified: Secondary | ICD-10-CM

## 2010-08-26 ENCOUNTER — Other Ambulatory Visit: Payer: Self-pay | Admitting: *Deleted

## 2010-08-26 DIAGNOSIS — G4700 Insomnia, unspecified: Secondary | ICD-10-CM

## 2010-08-26 MED ORDER — ZOLPIDEM TARTRATE ER 12.5 MG PO TBCR: 12.5000 mg | EXTENDED_RELEASE_TABLET | Freq: Every evening | ORAL | Status: DC | PRN

## 2010-10-17 LAB — T-HELPER CELL (CD4) - (RCID CLINIC ONLY): CD4 % Helper T Cell: 22 — ABNORMAL LOW

## 2010-10-21 ENCOUNTER — Other Ambulatory Visit: Payer: Self-pay | Admitting: *Deleted

## 2010-10-21 DIAGNOSIS — G4700 Insomnia, unspecified: Secondary | ICD-10-CM

## 2010-10-21 MED ORDER — ZOLPIDEM TARTRATE ER 12.5 MG PO TBCR: 12.5000 mg | EXTENDED_RELEASE_TABLET | Freq: Every evening | ORAL | Status: DC | PRN

## 2010-11-04 LAB — I-STAT 8, (EC8 V) (CONVERTED LAB)
Acid-Base Excess: 6 — ABNORMAL HIGH
Bicarbonate: 29.3 — ABNORMAL HIGH
Chloride: 98
HCT: 41
Operator id: 208731
pCO2, Ven: 37.8 — ABNORMAL LOW

## 2010-11-04 LAB — BASIC METABOLIC PANEL
CO2: 29
Calcium: 9
Chloride: 97
GFR calc Af Amer: >60
Sodium: 136

## 2010-11-04 LAB — POCT HEMOGLOBIN-HEMACUE: Hemoglobin: 14

## 2010-12-08 ENCOUNTER — Ambulatory Visit: Payer: Managed Care, Other (non HMO)

## 2010-12-22 ENCOUNTER — Other Ambulatory Visit: Payer: Self-pay | Admitting: *Deleted

## 2010-12-22 DIAGNOSIS — G4700 Insomnia, unspecified: Secondary | ICD-10-CM

## 2010-12-22 MED ORDER — ZOLPIDEM TARTRATE ER 12.5 MG PO TBCR: 12.5000 mg | EXTENDED_RELEASE_TABLET | Freq: Every evening | ORAL | Status: DC | PRN

## 2011-01-14 ENCOUNTER — Other Ambulatory Visit (INDEPENDENT_AMBULATORY_CARE_PROVIDER_SITE_OTHER): Payer: Managed Care, Other (non HMO)

## 2011-01-14 ENCOUNTER — Other Ambulatory Visit: Payer: Self-pay | Admitting: Infectious Diseases

## 2011-01-14 DIAGNOSIS — Z79899 Other long term (current) drug therapy: Secondary | ICD-10-CM

## 2011-01-14 DIAGNOSIS — B20 Human immunodeficiency virus [HIV] disease: Secondary | ICD-10-CM

## 2011-01-15 ENCOUNTER — Other Ambulatory Visit: Payer: Managed Care, Other (non HMO)

## 2011-01-15 LAB — RPR

## 2011-01-15 LAB — CBC WITH DIFFERENTIAL/PLATELET
Basophils Absolute: 0 10*3/uL (ref 0.0–0.1)
Basophils Relative: 0 % (ref 0–1)
Eosinophils Absolute: 0.2 10*3/uL (ref 0.0–0.7)
Eosinophils Relative: 1 % (ref 0–5)
MCH: 31.6 pg (ref 26.0–34.0)
MCHC: 33.4 g/dL (ref 30.0–36.0)
MCV: 94.5 fL (ref 78.0–100.0)
Platelets: 292 10*3/uL (ref 150–400)
RDW: 13.4 % (ref 11.5–15.5)
WBC: 11.8 10*3/uL — ABNORMAL HIGH (ref 4.0–10.5)

## 2011-01-15 LAB — T-HELPER CELL (CD4) - (RCID CLINIC ONLY)
CD4 % Helper T Cell: 26 % — ABNORMAL LOW (ref 33–55)
CD4 T Cell Abs: 1010 uL (ref 400–2700)

## 2011-01-15 LAB — LIPID PANEL
Cholesterol: 221 mg/dL — ABNORMAL HIGH (ref 0–200)
Total CHOL/HDL Ratio: 5.3 Ratio

## 2011-01-15 LAB — COMPREHENSIVE METABOLIC PANEL
ALT: 9 U/L (ref 0–35)
AST: 15 U/L (ref 0–37)
Creat: 0.8 mg/dL (ref 0.50–1.10)
Total Bilirubin: 0.2 mg/dL — ABNORMAL LOW (ref 0.3–1.2)

## 2011-01-29 ENCOUNTER — Ambulatory Visit (INDEPENDENT_AMBULATORY_CARE_PROVIDER_SITE_OTHER): Payer: Managed Care, Other (non HMO) | Admitting: Infectious Diseases

## 2011-01-29 ENCOUNTER — Encounter: Payer: Self-pay | Admitting: Infectious Diseases

## 2011-01-29 VITALS — BP 114/76 | HR 90 | Temp 98.9°F | Ht 62.5 in | Wt 194.8 lb

## 2011-01-29 DIAGNOSIS — Z23 Encounter for immunization: Secondary | ICD-10-CM

## 2011-01-29 DIAGNOSIS — Z79899 Other long term (current) drug therapy: Secondary | ICD-10-CM

## 2011-01-29 DIAGNOSIS — E785 Hyperlipidemia, unspecified: Secondary | ICD-10-CM

## 2011-01-29 DIAGNOSIS — Z21 Asymptomatic human immunodeficiency virus [HIV] infection status: Secondary | ICD-10-CM

## 2011-01-29 DIAGNOSIS — B20 Human immunodeficiency virus [HIV] disease: Secondary | ICD-10-CM

## 2011-01-29 MED ORDER — ELVITEG-COBIC-EMTRICIT-TENOFDF 150-150-200-300 MG PO TABS: 1.0000 | ORAL_TABLET | Freq: Every day | ORAL | Status: DC

## 2011-01-29 NOTE — Progress Notes (Signed)
Addended by: Jennet Maduro D on: 01/29/2011 04:34 PM   Modules accepted: Orders

## 2011-01-29 NOTE — Progress Notes (Signed)
Patient ID: Amy Hernandez, female   DOB: Jul 23, 1965, 46 y.o.   MRN: [id number] Amy Hernandez is doing well on Atripla with suppressed HIV and restoration of her CD4 count. She has continued troubles with sleep and takes AmbienCR several times per week. I discussed switching to the to the "quad" pill and she is willing to try it.Her HTN is controlled and preventive care updated.  EXAM:BP 114/76  Pulse 90  Temp(Src) 98.9 F (37.2 C) (Oral)  Ht 5' 2.5" (1.588 m)  Wt 194 lb 12 oz (88.338 kg)  BMI 35.05 kg/m2 . Well nourished and developed. EOMI and pharynx benign.No adenopathy.  Impression: HIV controlled.  Plane change from Atripla to Stribild ARV. [name]

## 2011-02-24 ENCOUNTER — Other Ambulatory Visit: Payer: Self-pay | Admitting: Licensed Clinical Social Worker

## 2011-02-24 DIAGNOSIS — G4700 Insomnia, unspecified: Secondary | ICD-10-CM

## 2011-02-24 MED ORDER — ZOLPIDEM TARTRATE ER 12.5 MG PO TBCR: 12.5000 mg | EXTENDED_RELEASE_TABLET | Freq: Every evening | ORAL | Status: DC | PRN

## 2011-03-05 ENCOUNTER — Other Ambulatory Visit: Payer: Self-pay | Admitting: Infectious Diseases

## 2011-03-05 DIAGNOSIS — I10 Essential (primary) hypertension: Secondary | ICD-10-CM

## 2011-04-09 ENCOUNTER — Other Ambulatory Visit: Payer: Self-pay | Admitting: Licensed Clinical Social Worker

## 2011-04-09 MED ORDER — ALBUTEROL SULFATE HFA 108 (90 BASE) MCG/ACT IN AERS: 2.0000 | INHALATION_SPRAY | Freq: Two times a day (BID) | RESPIRATORY_TRACT | Status: DC | PRN

## 2011-05-04 ENCOUNTER — Other Ambulatory Visit: Payer: Self-pay | Admitting: Infectious Diseases

## 2011-05-04 DIAGNOSIS — Z113 Encounter for screening for infections with a predominantly sexual mode of transmission: Secondary | ICD-10-CM

## 2011-05-28 ENCOUNTER — Other Ambulatory Visit: Payer: Managed Care, Other (non HMO)

## 2011-05-28 ENCOUNTER — Other Ambulatory Visit: Payer: Self-pay | Admitting: *Deleted

## 2011-05-28 DIAGNOSIS — B20 Human immunodeficiency virus [HIV] disease: Secondary | ICD-10-CM

## 2011-05-28 DIAGNOSIS — G4700 Insomnia, unspecified: Secondary | ICD-10-CM

## 2011-05-28 LAB — CBC WITH DIFFERENTIAL/PLATELET
Basophils Absolute: 0 10*3/uL (ref 0.0–0.1)
Basophils Relative: 0 % (ref 0–1)
Eosinophils Absolute: 0.1 10*3/uL (ref 0.0–0.7)
Lymphs Abs: 3.7 10*3/uL (ref 0.7–4.0)
MCH: 31.3 pg (ref 26.0–34.0)
MCHC: 33.4 g/dL (ref 30.0–36.0)
Neutrophils Relative %: 55 % (ref 43–77)
Platelets: 296 10*3/uL (ref 150–400)
RBC: 4.64 MIL/uL (ref 3.87–5.11)
RDW: 13.2 % (ref 11.5–15.5)

## 2011-05-28 LAB — COMPREHENSIVE METABOLIC PANEL
ALT: 10 U/L (ref 0–35)
AST: 14 U/L (ref 0–37)
Alkaline Phosphatase: 133 U/L — ABNORMAL HIGH (ref 39–117)
Creat: 0.86 mg/dL (ref 0.50–1.10)
Sodium: 137 mEq/L (ref 135–145)
Total Bilirubin: 0.2 mg/dL — ABNORMAL LOW (ref 0.3–1.2)
Total Protein: 7.3 g/dL (ref 6.0–8.3)

## 2011-05-28 MED ORDER — ZOLPIDEM TARTRATE ER 12.5 MG PO TBCR: 12.5000 mg | EXTENDED_RELEASE_TABLET | Freq: Every evening | ORAL | Status: DC | PRN

## 2011-06-02 LAB — HIV-1 RNA QUANT-NO REFLEX-BLD
HIV 1 RNA Quant: <20 copies/mL (ref <20)
HIV-1 RNA Quant, Log: <1.3 {Log} (ref <1.30)

## 2011-06-03 ENCOUNTER — Other Ambulatory Visit: Payer: Self-pay | Admitting: Infectious Diseases

## 2011-06-11 ENCOUNTER — Ambulatory Visit: Payer: Managed Care, Other (non HMO) | Admitting: Infectious Diseases

## 2011-06-12 ENCOUNTER — Ambulatory Visit: Payer: Managed Care, Other (non HMO) | Admitting: Infectious Diseases

## 2011-07-15 ENCOUNTER — Telehealth: Payer: Self-pay | Admitting: *Deleted

## 2011-07-15 NOTE — Telephone Encounter (Signed)
States she is very stressed at work. Wants to be written out for a while . 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 Had a bad day yesterday & today. I told her about FMLA which can be used for appts as well as times out of work. I asked that she call her HR dept and get the forms. Fill her part out & get them to [place]. Her md will be here tomorrow. She has an appt 08/21/11. I am unsure if he will require an appt to complete these.  Her dtr is pregnant & due in December. She wants to be out for that. I suggested that she find a pcp as she does have insurance. That md will be available more than her ID doctor. He/she can then write another FMLA for that reason.

## 2011-08-21 ENCOUNTER — Ambulatory Visit (INDEPENDENT_AMBULATORY_CARE_PROVIDER_SITE_OTHER): Payer: Managed Care, Other (non HMO) | Admitting: Infectious Diseases

## 2011-08-21 ENCOUNTER — Encounter: Payer: Self-pay | Admitting: Infectious Diseases

## 2011-08-21 VITALS — BP 116/75 | HR 80 | Temp 98.0°F | Ht 62.0 in | Wt 190.0 lb

## 2011-08-21 DIAGNOSIS — B20 Human immunodeficiency virus [HIV] disease: Secondary | ICD-10-CM

## 2011-08-21 NOTE — Progress Notes (Signed)
Patient ID: Amy Hernandez, female   DOB: 07/28/1965, 46 y.o.   MRN: [id number] Amy Hernandez has been doing well and adherent to her Atripla and her anti HTN meds. She has had no fevers, weight loss of focal pains.Her daughter is about to have her first grandchild and reassured her that there is no casual spread of HIV. BP 116/75  Pulse 80  Temp 98 F (36.7 C) (Oral)  Ht 5\' 2"  (1.575 m)  Wt 190 lb (86.183 kg)  BMI 34.75 kg/m2 NAD and healthy No rashes or adenopathy. Impression/plan HIV and HTN controlled Continue present regimen and will f/u 4 months. [name]

## 2011-08-25 ENCOUNTER — Other Ambulatory Visit: Payer: Self-pay | Admitting: Infectious Diseases

## 2011-08-25 DIAGNOSIS — G4700 Insomnia, unspecified: Secondary | ICD-10-CM

## 2011-09-28 ENCOUNTER — Other Ambulatory Visit: Payer: Self-pay | Admitting: Infectious Diseases

## 2011-10-02 ENCOUNTER — Encounter: Payer: Self-pay | Admitting: *Deleted

## 2011-10-02 DIAGNOSIS — Z90710 Acquired absence of both cervix and uterus: Secondary | ICD-10-CM | POA: Insufficient documentation

## 2011-10-26 ENCOUNTER — Other Ambulatory Visit: Payer: Self-pay | Admitting: Infectious Diseases

## 2011-10-28 ENCOUNTER — Other Ambulatory Visit: Payer: Self-pay | Admitting: *Deleted

## 2011-10-28 DIAGNOSIS — G4700 Insomnia, unspecified: Secondary | ICD-10-CM

## 2011-10-28 MED ORDER — ZOLPIDEM TARTRATE ER 12.5 MG PO TBCR: 12.5000 mg | EXTENDED_RELEASE_TABLET | Freq: Every evening | ORAL | Status: DC | PRN

## 2011-10-28 NOTE — Telephone Encounter (Signed)
Pt requesting refill for ambien cr rx.

## 2011-12-10 ENCOUNTER — Other Ambulatory Visit: Payer: Managed Care, Other (non HMO)

## 2011-12-25 ENCOUNTER — Ambulatory Visit: Payer: Managed Care, Other (non HMO) | Admitting: Infectious Diseases

## 2011-12-27 ENCOUNTER — Other Ambulatory Visit: Payer: Self-pay | Admitting: Infectious Diseases

## 2011-12-28 ENCOUNTER — Other Ambulatory Visit: Payer: Self-pay | Admitting: *Deleted

## 2011-12-28 ENCOUNTER — Other Ambulatory Visit (INDEPENDENT_AMBULATORY_CARE_PROVIDER_SITE_OTHER): Payer: Managed Care, Other (non HMO)

## 2011-12-28 DIAGNOSIS — B20 Human immunodeficiency virus [HIV] disease: Secondary | ICD-10-CM

## 2011-12-28 DIAGNOSIS — Z113 Encounter for screening for infections with a predominantly sexual mode of transmission: Secondary | ICD-10-CM

## 2011-12-28 DIAGNOSIS — G4700 Insomnia, unspecified: Secondary | ICD-10-CM

## 2011-12-28 DIAGNOSIS — E785 Hyperlipidemia, unspecified: Secondary | ICD-10-CM

## 2011-12-28 MED ORDER — ZOLPIDEM TARTRATE ER 12.5 MG PO TBCR: 12.5000 mg | EXTENDED_RELEASE_TABLET | Freq: Every evening | ORAL | Status: DC | PRN

## 2011-12-28 NOTE — Addendum Note (Signed)
Addended by: Mariea Clonts D on: 12/28/2011 03:34 PM   Modules accepted: Orders

## 2011-12-29 LAB — COMPREHENSIVE METABOLIC PANEL
ALT: 10 U/L (ref 0–35)
AST: 17 U/L (ref 0–37)
Albumin: 4.3 g/dL (ref 3.5–5.2)
CO2: 27 mEq/L (ref 19–32)
Calcium: 9.3 mg/dL (ref 8.4–10.5)
Chloride: 99 mEq/L (ref 96–112)
Potassium: 4 mEq/L (ref 3.5–5.3)
Total Protein: 7.3 g/dL (ref 6.0–8.3)

## 2011-12-29 LAB — LIPID PANEL
Cholesterol: 267 mg/dL — ABNORMAL HIGH (ref 0–200)
Total CHOL/HDL Ratio: 4.9 Ratio

## 2011-12-29 LAB — CBC WITH DIFFERENTIAL/PLATELET
HCT: 40.6 % (ref 36.0–46.0)
Hemoglobin: 14.2 g/dL (ref 12.0–15.0)
Lymphs Abs: 4.1 10*3/uL — ABNORMAL HIGH (ref 0.7–4.0)
MCH: 32.1 pg (ref 26.0–34.0)
MCHC: 35 g/dL (ref 30.0–36.0)
Monocytes Absolute: 0.4 10*3/uL (ref 0.1–1.0)
Monocytes Relative: 4 % (ref 3–12)
Neutro Abs: 5.5 10*3/uL (ref 1.7–7.7)
Neutrophils Relative %: 55 % (ref 43–77)
RBC: 4.43 MIL/uL (ref 3.87–5.11)

## 2011-12-29 LAB — T-HELPER CELL (CD4) - (RCID CLINIC ONLY)
CD4 % Helper T Cell: 28 % — ABNORMAL LOW (ref 33–55)
CD4 T Cell Abs: 1090 uL (ref 400–2700)

## 2011-12-29 LAB — RPR

## 2011-12-30 LAB — HIV-1 RNA QUANT-NO REFLEX-BLD
HIV 1 RNA Quant: <20 copies/mL (ref <20)
HIV-1 RNA Quant, Log: <1.3 {Log} (ref <1.30)

## 2012-01-08 ENCOUNTER — Ambulatory Visit (INDEPENDENT_AMBULATORY_CARE_PROVIDER_SITE_OTHER): Payer: Managed Care, Other (non HMO) | Admitting: Infectious Diseases

## 2012-01-08 ENCOUNTER — Encounter: Payer: Self-pay | Admitting: Infectious Diseases

## 2012-01-08 VITALS — BP 109/73 | HR 101 | Temp 98.2°F | Wt 180.0 lb

## 2012-01-08 DIAGNOSIS — B20 Human immunodeficiency virus [HIV] disease: Secondary | ICD-10-CM

## 2012-01-08 DIAGNOSIS — Z23 Encounter for immunization: Secondary | ICD-10-CM

## 2012-01-08 DIAGNOSIS — I10 Essential (primary) hypertension: Secondary | ICD-10-CM

## 2012-01-08 DIAGNOSIS — E785 Hyperlipidemia, unspecified: Secondary | ICD-10-CM

## 2012-01-08 MED ORDER — LISINOPRIL-HYDROCHLOROTHIAZIDE 20-25 MG PO TABS: 1.0000 | ORAL_TABLET | Freq: Every day | ORAL | Status: DC

## 2012-01-08 MED ORDER — SIMVASTATIN 20 MG PO TABS: 20.0000 mg | ORAL_TABLET | Freq: Every day | ORAL | Status: DC

## 2012-01-08 NOTE — Progress Notes (Signed)
Patient ID: Amy Hernandez, female   DOB: [birth year], 47 y.o.   MRN: [id number] Amy Hernandez's daughter is going to deliver and she has felt stressed by this and has not taken her statin in past several months but has been adherent to he ARV regiment and is HIV suppressed with CD4 >1000. She is still smoking a PPD and is thinking about changing to vaporized nicotine which I right now favor over failed attempts to quit. She is too anxious with all that is going on in her life. She has not other compalints. Exam BP 109/73  Pulse 101  Temp 98.2 F (36.8 C) (Oral)  Wt 180 lb (81.647 kg) NAD and healthy apppearance. No adenopathy or rashes. Impression/plans HIV suppressed and continue ARVs. Restart statins since smoker, HTN, HIV and family history are all risk factors. F/uin 4-5 months. Amy Hernandez

## 2012-02-29 ENCOUNTER — Other Ambulatory Visit: Payer: Self-pay | Admitting: *Deleted

## 2012-02-29 DIAGNOSIS — G4700 Insomnia, unspecified: Secondary | ICD-10-CM

## 2012-02-29 MED ORDER — ZOLPIDEM TARTRATE ER 12.5 MG PO TBCR: 12.5000 mg | EXTENDED_RELEASE_TABLET | Freq: Every evening | ORAL | Status: DC | PRN

## 2012-04-29 ENCOUNTER — Other Ambulatory Visit: Payer: Self-pay | Admitting: *Deleted

## 2012-04-29 DIAGNOSIS — G4700 Insomnia, unspecified: Secondary | ICD-10-CM

## 2012-04-29 MED ORDER — ZOLPIDEM TARTRATE ER 12.5 MG PO TBCR: 12.5000 mg | EXTENDED_RELEASE_TABLET | Freq: Every evening | ORAL | Status: DC | PRN

## 2012-04-29 NOTE — Telephone Encounter (Signed)
Per Dr Maurice March ok to fill patient Rx.

## 2012-05-02 ENCOUNTER — Other Ambulatory Visit: Payer: Self-pay | Admitting: *Deleted

## 2012-05-02 ENCOUNTER — Other Ambulatory Visit: Payer: Self-pay | Admitting: Infectious Diseases

## 2012-05-02 DIAGNOSIS — G4700 Insomnia, unspecified: Secondary | ICD-10-CM

## 2012-05-02 MED ORDER — ZOLPIDEM TARTRATE ER 12.5 MG PO TBCR: 12.5000 mg | EXTENDED_RELEASE_TABLET | Freq: Every evening | ORAL | Status: DC | PRN

## 2012-05-05 ENCOUNTER — Other Ambulatory Visit: Payer: Managed Care, Other (non HMO)

## 2012-05-05 DIAGNOSIS — B20 Human immunodeficiency virus [HIV] disease: Secondary | ICD-10-CM

## 2012-05-05 LAB — COMPLETE METABOLIC PANEL WITH GFR
ALT: 8 U/L (ref 0–35)
AST: 14 U/L (ref 0–37)
Albumin: 4.1 g/dL (ref 3.5–5.2)
CO2: 30 mEq/L (ref 19–32)
Calcium: 9 mg/dL (ref 8.4–10.5)
Chloride: 98 mEq/L (ref 96–112)
GFR, Est African American: 89 mL/min
Potassium: 3.1 mEq/L — ABNORMAL LOW (ref 3.5–5.3)
Total Protein: 6.7 g/dL (ref 6.0–8.3)

## 2012-05-05 LAB — CBC WITH DIFFERENTIAL/PLATELET
Basophils Absolute: 0 10*3/uL (ref 0.0–0.1)
Basophils Relative: 0 % (ref 0–1)
Eosinophils Absolute: 0.2 10*3/uL (ref 0.0–0.7)
Eosinophils Relative: 2 % (ref 0–5)
HCT: 37.4 % (ref 36.0–46.0)
MCH: 31.1 pg (ref 26.0–34.0)
MCHC: 34.5 g/dL (ref 30.0–36.0)
MCV: 90.1 fL (ref 78.0–100.0)
Monocytes Absolute: 0.5 10*3/uL (ref 0.1–1.0)
Neutro Abs: 5 10*3/uL (ref 1.7–7.7)
RDW: 13.4 % (ref 11.5–15.5)

## 2012-05-07 LAB — HIV-1 RNA QUANT-NO REFLEX-BLD: HIV-1 RNA Quant, Log: <1.3 {Log} (ref <1.30)

## 2012-05-16 ENCOUNTER — Other Ambulatory Visit: Payer: Self-pay | Admitting: Licensed Clinical Social Worker

## 2012-05-16 DIAGNOSIS — B20 Human immunodeficiency virus [HIV] disease: Secondary | ICD-10-CM

## 2012-05-16 MED ORDER — EFAVIRENZ-EMTRICITAB-TENOFOVIR 600-200-300 MG PO TABS: 1.0000 | ORAL_TABLET | Freq: Every day | ORAL | Status: DC

## 2012-05-20 ENCOUNTER — Ambulatory Visit (INDEPENDENT_AMBULATORY_CARE_PROVIDER_SITE_OTHER): Payer: Managed Care, Other (non HMO) | Admitting: Infectious Diseases

## 2012-05-20 ENCOUNTER — Encounter: Payer: Self-pay | Admitting: Infectious Diseases

## 2012-05-20 VITALS — BP 122/76 | HR 85 | Temp 98.9°F | Wt 191.0 lb

## 2012-05-20 DIAGNOSIS — Z113 Encounter for screening for infections with a predominantly sexual mode of transmission: Secondary | ICD-10-CM

## 2012-05-20 DIAGNOSIS — B20 Human immunodeficiency virus [HIV] disease: Secondary | ICD-10-CM

## 2012-05-20 NOTE — Progress Notes (Signed)
Amy Hernandez is doing well and has no complaints. She has HIV controlled and her other medical problems are controlled and asymptomatic. She has 2 children who are 9 and 21 and she supports them including her new grandchild. This sometimes feels overwhelming but she has not felt depressed or dysfunctional.She has had no fevers or weight loss and her preventive measures are up to date. [name]

## 2012-05-20 NOTE — Patient Instructions (Addendum)
Return in 4 months with labs prior.  Will be seeing Dr. Drue Second.

## 2012-06-29 ENCOUNTER — Other Ambulatory Visit: Payer: Self-pay | Admitting: *Deleted

## 2012-06-29 DIAGNOSIS — G4700 Insomnia, unspecified: Secondary | ICD-10-CM

## 2012-06-29 MED ORDER — ZOLPIDEM TARTRATE ER 12.5 MG PO TBCR: 12.5000 mg | EXTENDED_RELEASE_TABLET | Freq: Every evening | ORAL | Status: DC | PRN

## 2012-07-11 ENCOUNTER — Ambulatory Visit (INDEPENDENT_AMBULATORY_CARE_PROVIDER_SITE_OTHER): Payer: Managed Care, Other (non HMO) | Admitting: Infectious Diseases

## 2012-07-11 ENCOUNTER — Telehealth: Payer: Self-pay | Admitting: *Deleted

## 2012-07-11 ENCOUNTER — Encounter: Payer: Self-pay | Admitting: Infectious Diseases

## 2012-07-11 VITALS — BP 119/81 | HR 86 | Temp 98.1°F | Ht 62.5 in | Wt 186.0 lb

## 2012-07-11 DIAGNOSIS — R1032 Left lower quadrant pain: Secondary | ICD-10-CM

## 2012-07-11 DIAGNOSIS — B20 Human immunodeficiency virus [HIV] disease: Secondary | ICD-10-CM

## 2012-07-11 NOTE — Assessment & Plan Note (Signed)
HIV 1 RNA Quant (copies/mL)  Date Value  05/05/2012 <20   12/28/2011 <20   05/28/2011 <20      CD4 T Cell Abs (cmm)  Date Value  05/05/2012 1090   12/28/2011 1090   05/28/2011 990     Doing very well, under Dr Bonnetta Barry outstanding care.

## 2012-07-11 NOTE — Progress Notes (Signed)
  Subjective:    Patient ID: Amy Hernandez, female    DOB: [birth year], 47 y.o.   MRN: [id number]  Abdominal Pain   47 yo F with HIV+ since ? Has been on atripla.  Has been having GI upset for last 2 weeks. Having RLQ pain and central back pain. Also having back pain. Has had BM x 4 this am. Has been eating less, loss of appetite. Feels hot and cold, having occas sweats. Denies fever though. No dysuria, no cloudiness, no hematuria. More loose than diarrhea BM. No recent anbx. Grandchild at home, not sick.  Previous cholecystectomy and appendectomy.   Review of Systems  Gastrointestinal: Positive for abdominal pain.       Objective:   Physical Exam  Constitutional: She appears well-developed and well-nourished.  HENT:  Mouth/Throat: No oropharyngeal exudate.  Eyes: EOM are normal. Pupils are equal, round, and reactive to light.  Neck: Neck supple.  Cardiovascular: Normal rate, regular rhythm and normal heart sounds.   Pulmonary/Chest: Effort normal and breath sounds normal.  Abdominal: Soft. Bowel sounds are normal. There is tenderness.    Lymphadenopathy:    She has no cervical adenopathy.          Assessment & Plan:

## 2012-07-11 NOTE — Telephone Encounter (Signed)
Patient called and advised she has been sick and in pain for the past 2 weeks. She advised she has had terrible stomach cramps with pressure and pain. She advised she has no appetite but she goes to the restroom much more frequently than normal about 4-5 times a day and it is not diarrhea. She reports no fever but chills and sweats. She would like to be seen as she is at work and can barely stand the pain. Advised her we have an 11 am appt but not with her doctor with [facility] [facility] [facility] and she advised she will take it.

## 2012-07-11 NOTE — Assessment & Plan Note (Signed)
Very wide ddx- stone, hernia, diverticulitis, pancreatitis? Her appendix and gall bladder have been removed. Will send her for UA, cbc, cmp, amylase/lipase, CT abd. See her back in 1 week.

## 2012-07-13 ENCOUNTER — Other Ambulatory Visit: Payer: Managed Care, Other (non HMO)

## 2012-07-14 ENCOUNTER — Other Ambulatory Visit: Payer: Managed Care, Other (non HMO)

## 2012-07-14 DIAGNOSIS — R1032 Left lower quadrant pain: Secondary | ICD-10-CM

## 2012-07-14 DIAGNOSIS — Z113 Encounter for screening for infections with a predominantly sexual mode of transmission: Secondary | ICD-10-CM

## 2012-07-14 DIAGNOSIS — B20 Human immunodeficiency virus [HIV] disease: Secondary | ICD-10-CM

## 2012-07-14 LAB — CBC WITH DIFFERENTIAL/PLATELET
Basophils Relative: 1 % (ref 0–1)
Eosinophils Absolute: 0.2 10*3/uL (ref 0.0–0.7)
Eosinophils Relative: 2 % (ref 0–5)
HCT: 38.5 % (ref 36.0–46.0)
Hemoglobin: 13.3 g/dL (ref 12.0–15.0)
Lymphs Abs: 3.2 10*3/uL (ref 0.7–4.0)
MCH: 30.6 pg (ref 26.0–34.0)
MCHC: 34.5 g/dL (ref 30.0–36.0)
MCV: 88.7 fL (ref 78.0–100.0)
Monocytes Absolute: 0.4 10*3/uL (ref 0.1–1.0)
Monocytes Relative: 5 % (ref 3–12)
RBC: 4.34 MIL/uL (ref 3.87–5.11)

## 2012-07-15 ENCOUNTER — Ambulatory Visit (HOSPITAL_COMMUNITY)
Admission: RE | Admit: 2012-07-15 | Discharge: 2012-07-15 | Disposition: A | Discharge status: Home / Self Care | Payer: Managed Care, Other (non HMO) | Source: Ambulatory Visit | Attending: Infectious Diseases | Admitting: Infectious Diseases

## 2012-07-15 DIAGNOSIS — R634 Abnormal weight loss: Secondary | ICD-10-CM | POA: Insufficient documentation

## 2012-07-15 DIAGNOSIS — K5730 Diverticulosis of large intestine without perforation or abscess without bleeding: Secondary | ICD-10-CM | POA: Insufficient documentation

## 2012-07-15 DIAGNOSIS — K7689 Other specified diseases of liver: Secondary | ICD-10-CM | POA: Insufficient documentation

## 2012-07-15 DIAGNOSIS — Z21 Asymptomatic human immunodeficiency virus [HIV] infection status: Secondary | ICD-10-CM | POA: Insufficient documentation

## 2012-07-15 DIAGNOSIS — R61 Generalized hyperhidrosis: Secondary | ICD-10-CM | POA: Insufficient documentation

## 2012-07-15 DIAGNOSIS — R1032 Left lower quadrant pain: Secondary | ICD-10-CM | POA: Insufficient documentation

## 2012-07-15 DIAGNOSIS — N83209 Unspecified ovarian cyst, unspecified side: Secondary | ICD-10-CM | POA: Insufficient documentation

## 2012-07-15 LAB — COMPREHENSIVE METABOLIC PANEL
Alkaline Phosphatase: 120 U/L — ABNORMAL HIGH (ref 39–117)
BUN: 12 mg/dL (ref 6–23)
CO2: 28 mEq/L (ref 19–32)
Glucose, Bld: 101 mg/dL — ABNORMAL HIGH (ref 70–99)
Total Bilirubin: 0.2 mg/dL — ABNORMAL LOW (ref 0.3–1.2)

## 2012-07-15 LAB — URINALYSIS, ROUTINE W REFLEX MICROSCOPIC
Glucose, UA: NEGATIVE mg/dL
Leukocytes, UA: NEGATIVE
pH: 6.5 (ref 5.0–8.0)

## 2012-07-15 LAB — HIV-1 RNA QUANT-NO REFLEX-BLD
HIV 1 RNA Quant: <20 copies/mL (ref <20)
HIV-1 RNA Quant, Log: <1.3 {Log} (ref <1.30)

## 2012-07-15 LAB — T-HELPER CELL (CD4) - (RCID CLINIC ONLY): CD4 T Cell Abs: 930 uL (ref 400–2700)

## 2012-07-15 MED ORDER — IOHEXOL 300 MG/ML  SOLN
80.0000 mL | Freq: Once | INTRAMUSCULAR | Status: AC | PRN
  Administered 2012-07-15: 80 mL via INTRAVENOUS

## 2012-07-19 ENCOUNTER — Encounter: Payer: Self-pay | Admitting: Internal Medicine

## 2012-07-19 ENCOUNTER — Ambulatory Visit (INDEPENDENT_AMBULATORY_CARE_PROVIDER_SITE_OTHER): Payer: Managed Care, Other (non HMO) | Admitting: Internal Medicine

## 2012-07-19 VITALS — BP 99/75 | HR 85 | Temp 98.2°F | Ht 62.5 in | Wt 187.0 lb

## 2012-07-19 DIAGNOSIS — R1032 Left lower quadrant pain: Secondary | ICD-10-CM

## 2012-07-19 DIAGNOSIS — N750 Cyst of Bartholin's gland: Secondary | ICD-10-CM | POA: Insufficient documentation

## 2012-07-19 DIAGNOSIS — B20 Human immunodeficiency virus [HIV] disease: Secondary | ICD-10-CM

## 2012-07-19 NOTE — Progress Notes (Signed)
  Subjective:    Patient ID: Amy Hernandez, female    DOB: Oct 11, 1965, 47 y.o.   MRN: [id number]  HPI She comes in for followup of her gastroenteritis. She did have evaluation one week ago with labs and CT scan which were all reassuring. There was noted some fatty liver on the CT scan. She since has noted some labial swelling of the left side. No fever or chills. It is irritated with movement and walking. Never had this before. No further diarrhea, abdominal pain. Some fatigue still.   Review of Systems  Constitutional: Positive for appetite change and fatigue. Negative for fever, chills and unexpected weight change.  Respiratory: Negative for shortness of breath.   Cardiovascular: Negative for leg swelling.  Gastrointestinal: Negative for nausea, vomiting, abdominal pain, diarrhea, constipation and abdominal distention.  Genitourinary: Positive for vaginal pain.  Skin: Negative for rash.  Neurological: Negative for dizziness and headaches.       Objective:   Physical Exam  Constitutional: She appears well-developed and well-nourished. No distress.  Eyes: No scleral icterus.  Genitourinary:  Plus left labial majora swelling  Lymphadenopathy:    She has no cervical adenopathy.  Skin: Skin is warm and dry. No rash noted.  Psychiatric: She has a normal mood and affect. Her behavior is normal.          Assessment & Plan:

## 2012-07-19 NOTE — Assessment & Plan Note (Signed)
This seems to be resolving. Followup p.r.n.

## 2012-07-19 NOTE — Assessment & Plan Note (Signed)
I suspect this will need to be drained. There is some fluctuance and warmth and erythema. I will send her to gynecology for consideration of drainage and

## 2012-07-19 NOTE — Assessment & Plan Note (Signed)
Her viral load continues to be suppressed and will return in 4 months for routine followup.

## 2012-09-12 ENCOUNTER — Other Ambulatory Visit: Payer: Managed Care, Other (non HMO)

## 2012-09-27 ENCOUNTER — Ambulatory Visit: Payer: Managed Care, Other (non HMO) | Admitting: Internal Medicine

## 2012-09-27 ENCOUNTER — Other Ambulatory Visit: Payer: Self-pay | Admitting: *Deleted

## 2012-09-27 DIAGNOSIS — G4700 Insomnia, unspecified: Secondary | ICD-10-CM

## 2012-09-27 MED ORDER — ZOLPIDEM TARTRATE ER 12.5 MG PO TBCR: 12.5000 mg | EXTENDED_RELEASE_TABLET | Freq: Every evening | ORAL | Status: DC | PRN

## 2012-12-08 ENCOUNTER — Other Ambulatory Visit: Payer: Managed Care, Other (non HMO)

## 2012-12-08 ENCOUNTER — Encounter: Payer: Self-pay | Admitting: Internal Medicine

## 2012-12-08 ENCOUNTER — Ambulatory Visit
Admission: RE | Admit: 2012-12-08 | Discharge: 2012-12-08 | Disposition: A | Discharge status: Home / Self Care | Payer: Managed Care, Other (non HMO) | Source: Ambulatory Visit | Attending: Internal Medicine | Admitting: Internal Medicine

## 2012-12-08 ENCOUNTER — Ambulatory Visit (INDEPENDENT_AMBULATORY_CARE_PROVIDER_SITE_OTHER): Payer: Managed Care, Other (non HMO) | Admitting: Internal Medicine

## 2012-12-08 VITALS — BP 123/77 | HR 103 | Temp 98.3°F | Ht 62.5 in | Wt 184.0 lb

## 2012-12-08 DIAGNOSIS — R05 Cough: Secondary | ICD-10-CM | POA: Insufficient documentation

## 2012-12-08 DIAGNOSIS — R059 Cough, unspecified: Secondary | ICD-10-CM | POA: Insufficient documentation

## 2012-12-08 DIAGNOSIS — B20 Human immunodeficiency virus [HIV] disease: Secondary | ICD-10-CM

## 2012-12-08 LAB — CBC WITH DIFFERENTIAL/PLATELET
Basophils Absolute: 0 10*3/uL (ref 0.0–0.1)
Lymphocytes Relative: 37 % (ref 12–46)
Lymphs Abs: 3.4 10*3/uL (ref 0.7–4.0)
Neutro Abs: 5.1 10*3/uL (ref 1.7–7.7)
Neutrophils Relative %: 55 % (ref 43–77)
Platelets: 264 10*3/uL (ref 150–400)
RBC: 4.34 MIL/uL (ref 3.87–5.11)
WBC: 9.1 10*3/uL (ref 4.0–10.5)

## 2012-12-08 LAB — COMPLETE METABOLIC PANEL WITH GFR
ALT: 10 U/L (ref 0–35)
AST: 24 U/L (ref 0–37)
CO2: 31 mEq/L (ref 19–32)
Calcium: 9.2 mg/dL (ref 8.4–10.5)
Chloride: 97 mEq/L (ref 96–112)
GFR, Est African American: >89 mL/min
Sodium: 138 mEq/L (ref 135–145)
Total Protein: 7.3 g/dL (ref 6.0–8.3)

## 2012-12-08 NOTE — Assessment & Plan Note (Signed)
I suspect this is from her smoking. We'll check a chest x-ray to be sure there is no concerning mass. I have asked her to try albuterol for several days and see if that helps improve her cough if no improvement to try an over-the-counter allergy medicine. I will followup with her in one week for this

## 2012-12-08 NOTE — Progress Notes (Signed)
  Subjective:    Patient ID: Amy Hernandez, female    DOB: 1965/03/29, 47 y.o.   MRN: [id number]  HPI She comes in here for a work in visit. She has HIV and has been well-controlled on Atripla for a long time. She denies any missed doses and continues to take it well. She she comes in today for a cough. This started with an illness about 6 months ago and has been persistent since. Currently there is no fever, no chills. It is a productive cough with clear sputum. Sometimes it is dry. Seems to be worse with cold weather. No history of asthma. She does though use albuterol occasionally but does not typically wheeze. No sick contacts.   Review of Systems  Constitutional: Negative for fever and chills.  HENT: Negative for sore throat.   Eyes: Negative for visual disturbance.  Respiratory: Positive for cough. Negative for shortness of breath and wheezing.   Cardiovascular: Negative for chest pain.  Gastrointestinal: Negative for nausea and diarrhea.  Musculoskeletal: Negative for myalgias.  Skin: Negative for rash.  Neurological: Negative for dizziness, light-headedness and headaches.  Hematological: Negative for adenopathy.  Psychiatric/Behavioral: Negative for dysphoric mood.       Objective:   Physical Exam  Constitutional: She appears well-developed and well-nourished. No distress.  HENT:  Mouth/Throat: No oropharyngeal exudate.  Eyes: Right eye exhibits no discharge. Left eye exhibits no discharge. No scleral icterus.  Cardiovascular: Normal rate, regular rhythm and normal heart sounds.   No murmur heard. Pulmonary/Chest: Effort normal and breath sounds normal. No respiratory distress. She has no wheezes. She has no rales.  Lymphadenopathy:    She has no cervical adenopathy.  Neurological: She is alert.  Skin: Skin is warm and dry. No rash noted.  Psychiatric: She has a normal mood and affect. Her behavior is normal.          Assessment & Plan:

## 2012-12-08 NOTE — Assessment & Plan Note (Signed)
She is doing well and I will check her labs today she will return in one week

## 2012-12-11 LAB — HIV-1 RNA QUANT-NO REFLEX-BLD
HIV 1 RNA Quant: <20 copies/mL (ref <20)
HIV-1 RNA Quant, Log: <1.3 {Log} (ref <1.30)

## 2012-12-15 ENCOUNTER — Ambulatory Visit: Payer: Managed Care, Other (non HMO) | Admitting: Internal Medicine

## 2012-12-27 ENCOUNTER — Other Ambulatory Visit: Payer: Self-pay | Admitting: *Deleted

## 2012-12-27 ENCOUNTER — Telehealth: Payer: Self-pay | Admitting: *Deleted

## 2012-12-27 DIAGNOSIS — I10 Essential (primary) hypertension: Secondary | ICD-10-CM

## 2012-12-27 DIAGNOSIS — G4700 Insomnia, unspecified: Secondary | ICD-10-CM

## 2012-12-27 MED ORDER — LISINOPRIL-HYDROCHLOROTHIAZIDE 20-25 MG PO TABS: 1.0000 | ORAL_TABLET | Freq: Every day | ORAL | Status: DC

## 2012-12-27 MED ORDER — ZOLPIDEM TARTRATE ER 12.5 MG PO TBCR: 12.5000 mg | EXTENDED_RELEASE_TABLET | Freq: Every evening | ORAL | Status: DC | PRN

## 2012-12-27 NOTE — Telephone Encounter (Signed)
Patient called requesting a refill of Ambien. Previous patient of Dr. Maurice March, is this ok to refill? Amy Hernandez

## 2012-12-27 NOTE — Telephone Encounter (Signed)
Ok to refill 

## 2013-01-01 ENCOUNTER — Other Ambulatory Visit: Payer: Self-pay | Admitting: Infectious Diseases

## 2013-02-22 ENCOUNTER — Encounter: Payer: Self-pay | Admitting: Licensed Clinical Social Worker

## 2013-02-22 ENCOUNTER — Telehealth: Payer: Self-pay | Admitting: Licensed Clinical Social Worker

## 2013-02-22 NOTE — Telephone Encounter (Signed)
Patient called stating that she had diarrhea and stomach cramping and was unable to work today. She also had this 2 weeks ago, she wanted to know if she has another virus or is something else is wrong. She wants to go back to work but would like something to stop the diarrhea. I advised her she may want a to be seen to do a stool culture. She does not want to come in because she feels too bad. Please advise

## 2013-02-22 NOTE — Telephone Encounter (Signed)
If there is no blood, immodium is fine.  Likely viral and will just need to run its course.

## 2013-03-28 ENCOUNTER — Other Ambulatory Visit: Payer: Self-pay | Admitting: *Deleted

## 2013-03-28 DIAGNOSIS — I10 Essential (primary) hypertension: Secondary | ICD-10-CM

## 2013-03-28 DIAGNOSIS — G4700 Insomnia, unspecified: Secondary | ICD-10-CM

## 2013-03-28 MED ORDER — LISINOPRIL-HYDROCHLOROTHIAZIDE 20-25 MG PO TABS: 1.0000 | ORAL_TABLET | Freq: Every day | ORAL | Status: DC

## 2013-03-28 MED ORDER — ZOLPIDEM TARTRATE ER 12.5 MG PO TBCR: 12.5000 mg | EXTENDED_RELEASE_TABLET | Freq: Every evening | ORAL | Status: DC | PRN

## 2013-04-24 ENCOUNTER — Telehealth: Payer: Self-pay | Admitting: *Deleted

## 2013-04-24 NOTE — Telephone Encounter (Signed)
Patient called requesting advice for itching, watery, red eyes.  Patient states she feels like it may be allergy related.  Patient has not tried OTC allergy medication.  She states that it "feels better when she washes it out" and would like to try something over the counter.  Pt will contact us if it does not  Help. Landis Gandy, RN

## 2013-05-08 ENCOUNTER — Other Ambulatory Visit: Payer: Self-pay | Admitting: Licensed Clinical Social Worker

## 2013-05-08 DIAGNOSIS — B20 Human immunodeficiency virus [HIV] disease: Secondary | ICD-10-CM

## 2013-05-08 MED ORDER — EFAVIRENZ-EMTRICITAB-TENOFOVIR 600-200-300 MG PO TABS: 1.0000 | ORAL_TABLET | Freq: Every day | ORAL | Status: DC

## 2013-05-16 ENCOUNTER — Ambulatory Visit (INDEPENDENT_AMBULATORY_CARE_PROVIDER_SITE_OTHER): Payer: Managed Care, Other (non HMO) | Admitting: Internal Medicine

## 2013-05-16 ENCOUNTER — Encounter: Payer: Self-pay | Admitting: Internal Medicine

## 2013-05-16 VITALS — BP 93/66 | HR 79 | Temp 98.4°F | Ht 62.5 in | Wt 186.0 lb

## 2013-05-16 DIAGNOSIS — B20 Human immunodeficiency virus [HIV] disease: Secondary | ICD-10-CM

## 2013-05-16 DIAGNOSIS — Z79899 Other long term (current) drug therapy: Secondary | ICD-10-CM

## 2013-05-16 DIAGNOSIS — I10 Essential (primary) hypertension: Secondary | ICD-10-CM

## 2013-05-16 DIAGNOSIS — Z113 Encounter for screening for infections with a predominantly sexual mode of transmission: Secondary | ICD-10-CM

## 2013-05-16 DIAGNOSIS — Z1239 Encounter for other screening for malignant neoplasm of breast: Secondary | ICD-10-CM | POA: Insufficient documentation

## 2013-05-16 DIAGNOSIS — E785 Hyperlipidemia, unspecified: Secondary | ICD-10-CM

## 2013-05-16 DIAGNOSIS — Z0001 Encounter for general adult medical examination with abnormal findings: Secondary | ICD-10-CM | POA: Insufficient documentation

## 2013-05-16 LAB — CBC WITH DIFFERENTIAL/PLATELET
Basophils Absolute: 0 10*3/uL (ref 0.0–0.1)
Basophils Relative: 0 % (ref 0–1)
EOS ABS: 0.3 10*3/uL (ref 0.0–0.7)
EOS PCT: 3 % (ref 0–5)
HCT: 38.2 % (ref 36.0–46.0)
Hemoglobin: 13.4 g/dL (ref 12.0–15.0)
Lymphocytes Relative: 36 % (ref 12–46)
Lymphs Abs: 3.6 10*3/uL (ref 0.7–4.0)
MCH: 30.8 pg (ref 26.0–34.0)
MCHC: 35.1 g/dL (ref 30.0–36.0)
MCV: 87.8 fL (ref 78.0–100.0)
Monocytes Absolute: 0.7 10*3/uL (ref 0.1–1.0)
Monocytes Relative: 7 % (ref 3–12)
Neutro Abs: 5.4 10*3/uL (ref 1.7–7.7)
Neutrophils Relative %: 54 % (ref 43–77)
PLATELETS: 258 10*3/uL (ref 150–400)
RBC: 4.35 MIL/uL (ref 3.87–5.11)
RDW: 13.8 % (ref 11.5–15.5)
WBC: 10 10*3/uL (ref 4.0–10.5)

## 2013-05-16 NOTE — Assessment & Plan Note (Signed)
Lipids today

## 2013-05-16 NOTE — Assessment & Plan Note (Signed)
I will check today and I emphasized compliance.  Pt given copay card information.  RTC 6 months, sooner if indicated.

## 2013-05-16 NOTE — Assessment & Plan Note (Signed)
Good control

## 2013-05-16 NOTE — Progress Notes (Signed)
  Subjective:    Patient ID: Amy Hernandez, female    DOB: 30-Oct-1965, 48 y.o.   MRN: [id number]  HPI  She comes in here for follow up. She has HIV and has been well-controlled on Atripla for a long time. She tells me today that she occasionally goes a few days without meds b/c she can't afford copay. Needs labs today.  Recently lost her job but apparently has insurance at least through December.  No weight loss, no diarrhea.  Still takes statin and bp med.     Review of Systems  Constitutional: Negative for fever and chills.  HENT: Negative for sore throat.   Eyes: Negative for visual disturbance.  Gastrointestinal: Negative for nausea and diarrhea.  Musculoskeletal: Negative for myalgias.  Skin: Negative for rash.  Neurological: Negative for dizziness, light-headedness and headaches.  Hematological: Negative for adenopathy.  Psychiatric/Behavioral: Negative for dysphoric mood.       Objective:   Physical Exam  Constitutional: She appears well-developed and well-nourished. No distress.  HENT:  Mouth/Throat: No oropharyngeal exudate.  Eyes: Right eye exhibits no discharge. Left eye exhibits no discharge. No scleral icterus.  Cardiovascular: Normal rate, regular rhythm and normal heart sounds.   No murmur heard. Pulmonary/Chest: Effort normal and breath sounds normal. No respiratory distress. She has no wheezes. She has no rales.  Lymphadenopathy:    She has no cervical adenopathy.  Neurological: She is alert.  Skin: Skin is warm and dry. No rash noted.  Psychiatric: She has a normal mood and affect. Her behavior is normal.          Assessment & Plan:

## 2013-05-17 LAB — COMPLETE METABOLIC PANEL WITH GFR
ALBUMIN: 4 g/dL (ref 3.5–5.2)
ALT: 11 U/L (ref 0–35)
AST: 18 U/L (ref 0–37)
Alkaline Phosphatase: 115 U/L (ref 39–117)
BUN: 7 mg/dL (ref 6–23)
CALCIUM: 8.6 mg/dL (ref 8.4–10.5)
CHLORIDE: 98 meq/L (ref 96–112)
CO2: 28 mEq/L (ref 19–32)
Creat: 0.74 mg/dL (ref 0.50–1.10)
GFR, Est African American: >89 mL/min
GFR, Est Non African American: >89 mL/min
Glucose, Bld: 89 mg/dL (ref 70–99)
POTASSIUM: 3.3 meq/L — AB (ref 3.5–5.3)
Sodium: 136 mEq/L (ref 135–145)
TOTAL PROTEIN: 7 g/dL (ref 6.0–8.3)
Total Bilirubin: 0.2 mg/dL (ref 0.2–1.2)

## 2013-05-17 LAB — LIPID PANEL
Cholesterol: 218 mg/dL — ABNORMAL HIGH (ref 0–200)
HDL: 45 mg/dL (ref >39)
LDL Cholesterol: 130 mg/dL — ABNORMAL HIGH (ref 0–99)
Total CHOL/HDL Ratio: 4.8 Ratio
Triglycerides: 217 mg/dL — ABNORMAL HIGH (ref <150)
VLDL: 43 mg/dL — ABNORMAL HIGH (ref 0–40)

## 2013-05-17 LAB — URINE CYTOLOGY ANCILLARY ONLY
Chlamydia: NEGATIVE
NEISSERIA GONORRHEA: NEGATIVE

## 2013-05-17 LAB — RPR

## 2013-05-18 LAB — T-HELPER CELL (CD4) - (RCID CLINIC ONLY)
CD4 % Helper T Cell: 30 % — ABNORMAL LOW (ref 33–55)
CD4 T CELL ABS: 1170 /uL (ref 400–2700)

## 2013-05-19 LAB — HIV-1 RNA QUANT-NO REFLEX-BLD
HIV 1 RNA Quant: <20 copies/mL (ref <20)
HIV-1 RNA Quant, Log: <1.3 {Log} (ref <1.30)

## 2013-06-27 ENCOUNTER — Other Ambulatory Visit: Payer: Self-pay | Admitting: *Deleted

## 2013-06-27 ENCOUNTER — Telehealth: Payer: Self-pay | Admitting: *Deleted

## 2013-06-27 DIAGNOSIS — G4700 Insomnia, unspecified: Secondary | ICD-10-CM

## 2013-06-27 MED ORDER — ZOLPIDEM TARTRATE ER 12.5 MG PO TBCR: 12.5000 mg | EXTENDED_RELEASE_TABLET | Freq: Every evening | ORAL | Status: DC | PRN

## 2013-06-27 NOTE — Telephone Encounter (Signed)
Patient notified

## 2013-06-27 NOTE — Telephone Encounter (Signed)
Patient called requesting refill on Ambien, please advise

## 2013-06-27 NOTE — Telephone Encounter (Signed)
It was filled in March for 1 month and 2 refills so next refill due is in July. Ok to refill then.

## 2013-07-02 ENCOUNTER — Other Ambulatory Visit: Payer: Self-pay | Admitting: Internal Medicine

## 2013-09-20 ENCOUNTER — Other Ambulatory Visit: Payer: Self-pay | Admitting: Internal Medicine

## 2013-11-02 ENCOUNTER — Other Ambulatory Visit: Payer: Managed Care, Other (non HMO)

## 2013-11-02 ENCOUNTER — Other Ambulatory Visit: Payer: Self-pay | Admitting: Internal Medicine

## 2013-11-02 DIAGNOSIS — B20 Human immunodeficiency virus [HIV] disease: Secondary | ICD-10-CM

## 2013-11-03 LAB — COMPREHENSIVE METABOLIC PANEL
ALK PHOS: 127 U/L — AB (ref 39–117)
ALT: 9 U/L (ref 0–35)
AST: 17 U/L (ref 0–37)
Albumin: 3.9 g/dL (ref 3.5–5.2)
BILIRUBIN TOTAL: 0.2 mg/dL (ref 0.2–1.2)
BUN: 10 mg/dL (ref 6–23)
CO2: 27 mEq/L (ref 19–32)
CREATININE: 0.87 mg/dL (ref 0.50–1.10)
Calcium: 9 mg/dL (ref 8.4–10.5)
Chloride: 99 mEq/L (ref 96–112)
Glucose, Bld: 100 mg/dL — ABNORMAL HIGH (ref 70–99)
Potassium: 3 mEq/L — ABNORMAL LOW (ref 3.5–5.3)
Sodium: 138 mEq/L (ref 135–145)
Total Protein: 6.8 g/dL (ref 6.0–8.3)

## 2013-11-03 LAB — CBC WITH DIFFERENTIAL/PLATELET
Basophils Absolute: 0 10*3/uL (ref 0.0–0.1)
Basophils Relative: 0 % (ref 0–1)
EOS ABS: 0.1 10*3/uL (ref 0.0–0.7)
EOS PCT: 1 % (ref 0–5)
HCT: 39.7 % (ref 36.0–46.0)
HEMOGLOBIN: 13.7 g/dL (ref 12.0–15.0)
Lymphocytes Relative: 37 % (ref 12–46)
Lymphs Abs: 3.8 10*3/uL (ref 0.7–4.0)
MCH: 31 pg (ref 26.0–34.0)
MCHC: 34.5 g/dL (ref 30.0–36.0)
MCV: 89.8 fL (ref 78.0–100.0)
MONOS PCT: 5 % (ref 3–12)
Monocytes Absolute: 0.5 10*3/uL (ref 0.1–1.0)
Neutro Abs: 5.9 10*3/uL (ref 1.7–7.7)
Neutrophils Relative %: 57 % (ref 43–77)
Platelets: 286 10*3/uL (ref 150–400)
RBC: 4.42 MIL/uL (ref 3.87–5.11)
RDW: 13.7 % (ref 11.5–15.5)
WBC: 10.4 10*3/uL (ref 4.0–10.5)

## 2013-11-03 LAB — HIV-1 RNA QUANT-NO REFLEX-BLD
HIV 1 RNA Quant: <20 copies/mL (ref <20)
HIV-1 RNA Quant, Log: <1.3 {Log} (ref <1.30)

## 2013-11-03 LAB — T-HELPER CELL (CD4) - (RCID CLINIC ONLY)
CD4 % Helper T Cell: 28 % — ABNORMAL LOW (ref 33–55)
CD4 T Cell Abs: 1100 /uL (ref 400–2700)

## 2013-11-16 ENCOUNTER — Ambulatory Visit (INDEPENDENT_AMBULATORY_CARE_PROVIDER_SITE_OTHER): Payer: Managed Care, Other (non HMO) | Admitting: Internal Medicine

## 2013-11-16 ENCOUNTER — Telehealth: Payer: Self-pay | Admitting: *Deleted

## 2013-11-16 ENCOUNTER — Encounter: Payer: Self-pay | Admitting: Internal Medicine

## 2013-11-16 VITALS — BP 115/78 | HR 78 | Temp 98.1°F | Wt 188.0 lb

## 2013-11-16 DIAGNOSIS — B20 Human immunodeficiency virus [HIV] disease: Secondary | ICD-10-CM

## 2013-11-16 DIAGNOSIS — Z113 Encounter for screening for infections with a predominantly sexual mode of transmission: Secondary | ICD-10-CM

## 2013-11-16 DIAGNOSIS — Z79899 Other long term (current) drug therapy: Secondary | ICD-10-CM

## 2013-11-16 DIAGNOSIS — Z23 Encounter for immunization: Secondary | ICD-10-CM

## 2013-11-16 NOTE — Assessment & Plan Note (Signed)
Doing well with hiv.  I emphasized to let us know if she is losing her insurance ahead of time.  RTC 6 months.

## 2013-11-16 NOTE — Telephone Encounter (Signed)
Called the [employer].  Amy Hernandez was approved for $4000.00 grant.  It is good through 11-16-14.

## 2013-11-16 NOTE — Progress Notes (Signed)
  Subjective:    Patient ID: Amy Hernandez, female    DOB: May 15, 1965, 48 y.o.   MRN: [id number]  HPI She comes in here for follow up. She has HIV and has been well-controlled on Atripla for a long time. Labs good with CD4 of 1100 and undetectable vl. Recently lost her job but apparently has insurance at least through December.  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 No weight loss, no diarrhea.  Still takes statin and bp med.     Review of Systems  Constitutional: Negative for fever and chills.  HENT: Negative for sore throat.   Eyes: Negative for visual disturbance.  Gastrointestinal: Negative for nausea and diarrhea.  Musculoskeletal: Negative for myalgias.  Skin: Negative for rash.  Neurological: Negative for dizziness, light-headedness and headaches.  Hematological: Negative for adenopathy.  Psychiatric/Behavioral: Negative for dysphoric mood.       Objective:   Physical Exam  Constitutional: She appears well-developed and well-nourished. No distress.  HENT:  Mouth/Throat: No oropharyngeal exudate.  Eyes: Right eye exhibits no discharge. Left eye exhibits no discharge. No scleral icterus.  Cardiovascular: Normal rate, regular rhythm and normal heart sounds.   No murmur heard. Pulmonary/Chest: Effort normal and breath sounds normal. No respiratory distress. She has no wheezes. She has no rales.  Lymphadenopathy:    She has no cervical adenopathy.  Neurological: She is alert.  Skin: Skin is warm and dry. No rash noted.  Psychiatric: She has a normal mood and affect. Her behavior is normal.          Assessment & Plan:

## 2013-11-24 ENCOUNTER — Telehealth: Payer: Self-pay | Admitting: *Deleted

## 2013-11-24 NOTE — Telephone Encounter (Signed)
Patient called to request refill on Ambien.

## 2013-11-27 ENCOUNTER — Other Ambulatory Visit: Payer: Self-pay | Admitting: *Deleted

## 2013-11-27 MED ORDER — ZOLPIDEM TARTRATE ER 12.5 MG PO TBCR: EXTENDED_RELEASE_TABLET | ORAL | Status: DC

## 2013-11-27 NOTE — Telephone Encounter (Signed)
Yes ok to refill the CR 12.5 mg #30, 2 refills.

## 2013-11-29 ENCOUNTER — Encounter: Payer: Self-pay | Admitting: *Deleted

## 2013-11-29 NOTE — Telephone Encounter (Signed)
Error

## 2013-11-29 NOTE — Telephone Encounter (Signed)
Done by Kennyth Lose

## 2013-12-05 ENCOUNTER — Other Ambulatory Visit: Payer: Self-pay | Admitting: *Deleted

## 2013-12-05 DIAGNOSIS — B20 Human immunodeficiency virus [HIV] disease: Secondary | ICD-10-CM

## 2013-12-05 DIAGNOSIS — I10 Essential (primary) hypertension: Secondary | ICD-10-CM

## 2013-12-05 MED ORDER — EFAVIRENZ-EMTRICITAB-TENOFOVIR 600-200-300 MG PO TABS: 1.0000 | ORAL_TABLET | Freq: Every day | ORAL | Status: DC

## 2013-12-05 MED ORDER — LISINOPRIL-HYDROCHLOROTHIAZIDE 20-25 MG PO TABS: 1.0000 | ORAL_TABLET | Freq: Every day | ORAL | Status: DC

## 2013-12-13 ENCOUNTER — Other Ambulatory Visit: Payer: Self-pay

## 2013-12-19 LAB — CYTOLOGY - PAP

## 2013-12-26 ENCOUNTER — Other Ambulatory Visit: Payer: Self-pay | Admitting: *Deleted

## 2013-12-26 MED ORDER — SIMVASTATIN 20 MG PO TABS: 20.0000 mg | ORAL_TABLET | Freq: Every day | ORAL | Status: DC

## 2013-12-27 ENCOUNTER — Other Ambulatory Visit: Payer: Self-pay | Admitting: Internal Medicine

## 2013-12-27 DIAGNOSIS — E785 Hyperlipidemia, unspecified: Secondary | ICD-10-CM

## 2013-12-28 ENCOUNTER — Ambulatory Visit: Payer: Managed Care, Other (non HMO)

## 2014-02-27 ENCOUNTER — Telehealth: Payer: Self-pay | Admitting: *Deleted

## 2014-02-27 NOTE — Telephone Encounter (Signed)
Patient's insurance ended 12/27/13, she is now out of [employer] funds.  She will need refills of medication soon. She is unemployed, but looking to start a new job in the near future, will need temporary assistance for a few months.  RN advised patient to come meet with [facility] for RW/ADAP and emergency 30 day supply of Atripla.  RN transferred patient to [employer], notified Ellin Mayhew, Lanice Schwab, RN

## 2014-03-06 ENCOUNTER — Ambulatory Visit: Payer: Self-pay

## 2014-03-06 ENCOUNTER — Other Ambulatory Visit: Payer: Self-pay | Admitting: *Deleted

## 2014-03-06 DIAGNOSIS — B20 Human immunodeficiency virus [HIV] disease: Secondary | ICD-10-CM

## 2014-03-06 MED ORDER — EFAVIRENZ-EMTRICITAB-TENOFOVIR 600-200-300 MG PO TABS: 1.0000 | ORAL_TABLET | Freq: Every day | ORAL | Status: DC

## 2014-03-08 ENCOUNTER — Other Ambulatory Visit: Payer: Self-pay | Admitting: Internal Medicine

## 2014-04-06 ENCOUNTER — Other Ambulatory Visit: Payer: Self-pay | Admitting: Internal Medicine

## 2014-04-06 DIAGNOSIS — B20 Human immunodeficiency virus [HIV] disease: Secondary | ICD-10-CM

## 2014-04-09 ENCOUNTER — Other Ambulatory Visit: Payer: Self-pay | Admitting: *Deleted

## 2014-04-09 ENCOUNTER — Other Ambulatory Visit: Payer: Self-pay | Admitting: Licensed Clinical Social Worker

## 2014-04-09 ENCOUNTER — Other Ambulatory Visit: Payer: Self-pay | Admitting: Internal Medicine

## 2014-04-09 DIAGNOSIS — B20 Human immunodeficiency virus [HIV] disease: Secondary | ICD-10-CM

## 2014-04-09 MED ORDER — EFAVIRENZ-EMTRICITAB-TENOFOVIR 600-200-300 MG PO TABS: 1.0000 | ORAL_TABLET | Freq: Every day | ORAL | Status: DC

## 2014-04-09 MED ORDER — ZOLPIDEM TARTRATE ER 12.5 MG PO TBCR: 12.5000 mg | EXTENDED_RELEASE_TABLET | Freq: Every evening | ORAL | Status: DC | PRN

## 2014-05-03 ENCOUNTER — Other Ambulatory Visit: Payer: Managed Care, Other (non HMO)

## 2014-05-04 ENCOUNTER — Other Ambulatory Visit: Payer: Managed Care, Other (non HMO)

## 2014-05-07 ENCOUNTER — Other Ambulatory Visit: Payer: Managed Care, Other (non HMO)

## 2014-05-17 ENCOUNTER — Ambulatory Visit: Payer: Managed Care, Other (non HMO) | Admitting: Internal Medicine

## 2014-07-03 ENCOUNTER — Other Ambulatory Visit: Payer: Self-pay | Admitting: Internal Medicine

## 2014-08-24 ENCOUNTER — Telehealth: Payer: Self-pay | Admitting: Licensed Clinical Social Worker

## 2014-08-24 ENCOUNTER — Ambulatory Visit (INDEPENDENT_AMBULATORY_CARE_PROVIDER_SITE_OTHER): Payer: Managed Care, Other (non HMO) | Admitting: Family Medicine

## 2014-08-24 VITALS — BP 102/70 | HR 81 | Temp 98.5°F | Resp 16 | Ht 64.5 in | Wt 181.0 lb

## 2014-08-24 DIAGNOSIS — R197 Diarrhea, unspecified: Secondary | ICD-10-CM

## 2014-08-24 DIAGNOSIS — R1030 Lower abdominal pain, unspecified: Secondary | ICD-10-CM

## 2014-08-24 DIAGNOSIS — R1031 Right lower quadrant pain: Secondary | ICD-10-CM

## 2014-08-24 DIAGNOSIS — N3090 Cystitis, unspecified without hematuria: Secondary | ICD-10-CM | POA: Diagnosis not present

## 2014-08-24 LAB — POCT CBC
Granulocyte percent: 59 %G (ref 37–80)
HCT, POC: 43.6 % (ref 37.7–47.9)
HEMOGLOBIN: 14.5 g/dL (ref 12.2–16.2)
LYMPH, POC: 3.5 — AB (ref 0.6–3.4)
MCH: 30.4 pg (ref 27–31.2)
MCHC: 33.3 g/dL (ref 31.8–35.4)
MCV: 91.5 fL (ref 80–97)
MID (cbc): 0.3 (ref 0–0.9)
MPV: 7.7 fL (ref 0–99.8)
POC Granulocyte: 5.5 (ref 2–6.9)
POC LYMPH PERCENT: 37.7 %L (ref 10–50)
POC MID %: 3.3 % (ref 0–12)
Platelet Count, POC: 295 10*3/uL (ref 142–424)
RBC: 4.77 M/uL (ref 4.04–5.48)
RDW, POC: 13.4 %
WBC: 9.4 10*3/uL (ref 4.6–10.2)

## 2014-08-24 LAB — POCT UA - MICROSCOPIC ONLY
Bacteria, U Microscopic: NEGATIVE
CRYSTALS, UR, HPF, POC: NEGATIVE
Casts, Ur, LPF, POC: NEGATIVE
Mucus, UA: POSITIVE
RBC, urine, microscopic: NEGATIVE
YEAST UA: NEGATIVE

## 2014-08-24 LAB — POCT URINALYSIS DIPSTICK
GLUCOSE UA: NEGATIVE
Nitrite, UA: NEGATIVE
Protein, UA: NEGATIVE
RBC UA: NEGATIVE
Spec Grav, UA: 1.02
Urobilinogen, UA: 0.2
pH, UA: 6.5

## 2014-08-24 MED ORDER — NITROFURANTOIN MONOHYD MACRO 100 MG PO CAPS: 100.0000 mg | ORAL_CAPSULE | Freq: Two times a day (BID) | ORAL | Status: DC

## 2014-08-24 NOTE — Patient Instructions (Signed)
Your blood count in the office was overall normal. There are some possible signs of a urinary tract infection. I wrote for an antibiotic but will also check a urine culture. If the urine culture is negative or normal you may be able to stop the antibiotic - we will call you about this.. Your current diarrhea and stomach discomfort may be due to a virus. We can hold off on a CAT scan today, but if your symptoms are not improving this weekend would recommend recheck a CT scan of your abdomen to make sure there is not an underlying infection. Drink plenty of fluids and a bland diet this weekend. Return to the clinic or go to the nearest emergency room if any of your symptoms worsen or new symptoms occur.  Diarrhea Diarrhea is frequent loose and watery bowel movements. It can cause you to feel weak and dehydrated. Dehydration can cause you to become tired and thirsty, have a dry mouth, and have decreased urination that often is dark yellow. Diarrhea is a sign of another problem, most often an infection that will not last long. In most cases, diarrhea typically lasts 2-3 days. However, it can last longer if it is a sign of something more serious. It is important to treat your diarrhea as directed by your caregiver to lessen or prevent future episodes of diarrhea. CAUSES  Some common causes include:  Gastrointestinal infections caused by viruses, bacteria, or parasites.  Food poisoning or food allergies.  Certain medicines, such as antibiotics, chemotherapy, and laxatives.  Artificial sweeteners and fructose.  Digestive disorders. HOME CARE INSTRUCTIONS  Ensure adequate fluid intake (hydration): Have 1 cup (8 oz) of fluid for each diarrhea episode. Avoid fluids that contain simple sugars or sports drinks, fruit juices, whole milk products, and sodas. Your urine should be clear or pale yellow if you are drinking enough fluids. Hydrate with an oral rehydration solution that you can purchase at pharmacies,  retail stores, and online. You can prepare an oral rehydration solution at home by mixing the following ingredients together:   - tsp table salt.   tsp baking soda.   tsp salt substitute containing potassium chloride.  1  tablespoons sugar.  1 L (34 oz) of water.  Certain foods and beverages may increase the speed at which food moves through the gastrointestinal (GI) tract. These foods and beverages should be avoided and include:  Caffeinated and alcoholic beverages.  High-fiber foods, such as raw fruits and vegetables, nuts, seeds, and whole grain breads and cereals.  Foods and beverages sweetened with sugar alcohols, such as xylitol, sorbitol, and mannitol.  Some foods may be well tolerated and may help thicken stool including:  Starchy foods, such as rice, toast, pasta, low-sugar cereal, oatmeal, grits, baked potatoes, crackers, and bagels.  Bananas.  Applesauce.  Add probiotic-rich foods to help increase healthy bacteria in the GI tract, such as yogurt and fermented milk products.  Wash your hands well after each diarrhea episode.  Only take over-the-counter or prescription medicines as directed by your caregiver.  Take a warm bath to relieve any burning or pain from frequent diarrhea episodes. SEEK IMMEDIATE MEDICAL CARE IF:   You are unable to keep fluids down.  You have persistent vomiting.  You have blood in your stool, or your stools are black and tarry.  You do not urinate in 6-8 hours, or there is only a small amount of very dark urine.  You have abdominal pain that increases or localizes.  You have  weakness, dizziness, confusion, or light-headedness.  You have a severe headache.  Your diarrhea gets worse or does not get better.  You have a fever or persistent symptoms for more than 2-3 days.  You have a fever and your symptoms suddenly get worse. MAKE SURE YOU:   Understand these instructions.  Will watch your condition.  Will get help right  away if you are not doing well or get worse. Document Released: 01/02/2002 Document Revised: 05/29/2013 Document Reviewed: 09/20/2011 [facility] Patient Information 2015 [place], [place]. This information is not intended to replace advice given to you by your health care provider. Make sure you discuss any questions you have with your health care provider. Abdominal Pain Many things can cause abdominal pain. Usually, abdominal pain is not caused by a disease and will improve without treatment. It can often be observed and treated at home. Your health care provider will do a physical exam and possibly order blood tests and X-rays to help determine the seriousness of your pain. However, in many cases, more time must pass before a clear cause of the pain can be found. Before that point, your health care provider may not know if you need more testing or further treatment. HOME CARE INSTRUCTIONS  Monitor your abdominal pain for any changes. The following actions may help to alleviate any discomfort you are experiencing:  Only take over-the-counter or prescription medicines as directed by your health care provider.  Do not take laxatives unless directed to do so by your health care provider.  Try a clear liquid diet (broth, tea, or water) as directed by your health care provider. Slowly move to a bland diet as tolerated. SEEK MEDICAL CARE IF:  You have unexplained abdominal pain.  You have abdominal pain associated with nausea or diarrhea.  You have pain when you urinate or have a bowel movement.  You experience abdominal pain that wakes you in the night.  You have abdominal pain that is worsened or improved by eating food.  You have abdominal pain that is worsened with eating fatty foods.  You have a fever. SEEK IMMEDIATE MEDICAL CARE IF:   Your pain does not go away within 2 hours.  You keep throwing up (vomiting).  Your pain is felt only in portions of the abdomen, such as the right side  or the left lower portion of the abdomen.  You pass bloody or black tarry stools. MAKE SURE YOU:  Understand these instructions.   Will watch your condition.   Will get help right away if you are not doing well or get worse.  Document Released: 10/22/2004 Document Revised: 01/17/2013 Document Reviewed: 09/21/2012 [facility] Patient Information 2015 [place], [place]. This information is not intended to replace advice given to you by your health care provider. Make sure you discuss any questions you have with your health care provider.

## 2014-08-24 NOTE — Telephone Encounter (Signed)
Patient called complaining of abdominal pain, and loose stools. She wanted to come in today but explained that we did not have any available appointments. Advised patient to be seen at Urgent Care or ED if she is still in pain and having diarrhea.

## 2014-08-24 NOTE — Progress Notes (Signed)
Subjective:  This chart was scribed for Amy Ray, MD by Thea Alken, ED Scribe. This patient was seen in room 3 and the patient's care was started at 3:23 PM.   Patient ID: Amy Hernandez, female    DOB: 1965/05/05, 49 y.o.   MRN: [id number]  HPI Chief Complaint  Patient presents with  . Abdominal Pain    x 3 days  . Diarrhea    x 3 days   HPI Comments: Amy Hernandez is a 49 y.o. female who presents to the [facility] complaining of abdominal pain and diarrhea. Hx of hyperlipemia and takes ATRIPLA. Last CD4 T cell abs was 1100 10/2013 and last  HIV <20. Pt states for the past 3 days she's had abdominal pain and diarrhea after eating about twice a day. She reports loss of appetite but states her stomach has been growling. She has been able to keep fluids down and has urinated 3 times today. She felt a little nausea on the way to office denies emesis. Her last colonoscopy was years ago and was diagnosed with IBS but no hx of diverticulitis and diverticulosis. She has been tolerating ATRIPLA and denies new medication.  No recent travels or new foods. No sick contacts. She denies hematuria, dysuria, frequency, fevers, chills, CP, cough, and trouble breathing. Pt is a recovering addict and has not drank in 20 years. She has an appoitnmentm   Patient Active Problem List   Diagnosis Date Noted  . Screening examination for venereal disease 05/16/2013  . Encounter for long-term (current) use of medications 05/16/2013  . Bartholin gland cyst 07/19/2012  . History of hysterectomy   . HYPERLIPIDEMIA 02/07/2009  . ADJ DISORDER WITH MIXED ANXIETY & DEPRESSED MOOD 04/07/2007  . HYPERGLYCEMIA 06/04/2006  . Human immunodeficiency virus (HIV) disease 12/05/2005  . HYPERTENSION 12/05/2005   Past Medical History  Diagnosis Date  . Substance abuse    Past Surgical History  Procedure Laterality Date  . Tubal ligation    . Abdominal hysterectomy     Allergies  Allergen Reactions  .  Codeine     REACTION: hallucinations   Prior to Admission medications   Medication Sig Start Date End Date Taking? Authorizing Provider  ATRIPLA [phone] MG per tablet TAKE 1 TABLET BY MOUTH AT BEDTIME 07/04/14  Yes Thayer Headings, MD  lisinopril-hydrochlorothiazide (PRINZIDE,ZESTORETIC) 20-25 MG per tablet Take 1 tablet by mouth daily. 12/05/13  Yes Thayer Headings, MD  simvastatin (ZOCOR) 20 MG tablet TAKE 1 TABLET DAILY 12/27/13  Yes Thayer Headings, MD  zolpidem (AMBIEN CR) 12.5 MG CR tablet Take 1 tablet (12.5 mg total) by mouth at bedtime as needed. for sleep 04/09/14  Yes Thayer Headings, MD  albuterol [facility] HFA) 108 (90 BASE) MCG/ACT inhaler Inhale 2 puffs into the lungs every 12 (twelve) hours as needed. Patient not taking: Reported on 08/24/2014 04/09/11   Lars Mage, MD  ATRIPLA [phone] MG per tablet TAKE 1 TABLET BY MOUTH AT BEDTIME Patient not taking: Reported on 08/24/2014 04/09/14   Thayer Headings, MD   History   Social History  . Marital Status: Married    Spouse Name: N/A  . Number of Children: N/A  . Years of Education: N/A   Occupational History  . Not on file.   Social History Main Topics  . Smoking status: Current Every Day Smoker -- 0.50 packs/day for 30 years    Types: Cigarettes  . Smokeless tobacco: Never Used  Comment:  pt. trying to quit  . Alcohol Use: No  . Drug Use: No  . Sexual Activity:    Partners: Male     Comment:  declined condoms   Other Topics Concern  . Not on file   Social History Narrative   Review of Systems  Constitutional: Positive for appetite change. Negative for fever and chills.  Respiratory: Negative for apnea, cough, shortness of breath and wheezing.   Cardiovascular: Negative for chest pain.  Gastrointestinal: Positive for nausea, abdominal pain and diarrhea. Negative for vomiting.  Genitourinary: Negative for dysuria, urgency, hematuria and difficulty urinating.     Objective:   Physical Exam  Constitutional:  She is oriented to person, place, and time. She appears well-developed and well-nourished. No distress.  HENT:  Head: Normocephalic and atraumatic.  Eyes: Conjunctivae and EOM are normal.  Neck: Neck supple.  Cardiovascular: Normal rate.   Pulmonary/Chest: Effort normal.  Abdominal: Bowel sounds are normal. There is tenderness in the suprapubic area and left lower quadrant. There is tenderness at McBurney's point ( minimal discomfort). There is no rebound, no guarding and negative Murphy's sign.  Musculoskeletal: Normal range of motion.  Minimal tenderness to right lower paraspinal muscles. But no CVA tenderness.  Neurological: She is alert and oriented to person, place, and time.  Skin: Skin is warm and dry.  Psychiatric: She has a normal mood and affect. Her behavior is normal.  Nursing note and vitals reviewed.    Filed Vitals:   08/24/14 1437  BP: 102/70  Pulse: 81  Temp: 98.5 F (36.9 C)  TempSrc: Oral  Resp: 16  Height: 5' 4.5" (1.638 m)  Weight: 181 lb (82.101 kg)  SpO2: 94%   Results for orders placed or performed in visit on 08/24/14  POCT CBC  Result Value Ref Range   WBC 9.4 4.6 - 10.2 K/uL   Lymph, poc 3.5 (A) 0.6 - 3.4   POC LYMPH PERCENT 37.7 10 - 50 %L   MID (cbc) 0.3 0 - 0.9   POC MID % 3.3 0 - 12 %M   POC Granulocyte 5.5 2 - 6.9   Granulocyte percent 59.0 37 - 80 %G   RBC 4.77 4.04 - 5.48 M/uL   Hemoglobin 14.5 12.2 - 16.2 g/dL   HCT, POC 43.6 37.7 - 47.9 %   MCV 91.5 80 - 97 fL   MCH, POC 30.4 27 - 31.2 pg   MCHC 33.3 31.8 - 35.4 g/dL   RDW, POC 13.4 %   Platelet Count, POC 295 142 - 424 K/uL   MPV 7.7 0 - 99.8 fL  POCT urinalysis dipstick  Result Value Ref Range   Color, UA YELLOW    Clarity, UA CLEAR    Glucose, UA NEG    Bilirubin, UA SMALL    Ketones, UA TRACE    Spec Grav, UA 1.020    Blood, UA NEG    pH, UA 6.5    Protein, UA NEG    Urobilinogen, UA 0.2    Nitrite, UA NEG    Leukocytes, UA small (1+) (A) Negative  POCT UA -  Microscopic Only  Result Value Ref Range   WBC, Ur, HPF, POC 6-8    RBC, urine, microscopic NEG    Bacteria, U Microscopic NEG    Mucus, UA POS    Epithelial cells, urine per micros 10-12    Crystals, Ur, HPF, POC NEG    Casts, Ur, LPF, POC NEG    Yeast,  UA NEG     Assessment & Plan:   Amy Hernandez is a 49 y.o. female RLQ abdominal pain - Plan: POCT CBC, POCT urinalysis dipstick, POCT UA - Microscopic Only, Suprapubic abdominal pain, unspecified laterality - Plan: POCT CBC, POCT urinalysis dipstick, POCT UA - Microscopic Only, nitrofurantoin, macrocrystal-monohydrate, (MACROBID) 100 MG capsule, Urine culture, Diarrhea  -three-day history of suprapubic than left lower quadrant/migratory abdominal pain. Afebrile here in office, reassuring CBC. No known history of diverticulosis or diverticulitis but discuss left lower quadrant pain may sometimes present this way. With associated diarrhea suspected viral syndrome, but if pain is not improving into tomorrow consider CT scan to rule out diverticulitis. ER/return to clinic precautions discussed for overnight.  Bland diet, fluids for now. Symptomatic care for diarrhea.  Cystitis - Plan: nitrofurantoin, macrocrystal-monohydrate, (MACROBID) 100 MG capsule, Urine culture  -suprapubic abdominal pain, possible UTI, but also epithelial cells though UA may be contaminant. Check urine culture, start Macrobid for now, but if culture negative can stop Macrobid.   Meds ordered this encounter  Medications  . nitrofurantoin, macrocrystal-monohydrate, (MACROBID) 100 MG capsule    Sig: Take 1 capsule (100 mg total) by mouth 2 (two) times daily.    Dispense:  14 capsule    Refill:  0   Patient Instructions  Your blood count in the office was overall normal. There are some possible signs of a urinary tract infection. I wrote for an antibiotic but will also check a urine culture. If the urine culture is negative or normal you may be able to stop the antibiotic -  we will call you about this.. Your current diarrhea and stomach discomfort may be due to a virus. We can hold off on a CAT scan today, but if your symptoms are not improving this weekend would recommend recheck a CT scan of your abdomen to make sure there is not an underlying infection. Drink plenty of fluids and a bland diet this weekend. Return to the clinic or go to the nearest emergency room if any of your symptoms worsen or new symptoms occur.  Diarrhea Diarrhea is frequent loose and watery bowel movements. It can cause you to feel weak and dehydrated. Dehydration can cause you to become tired and thirsty, have a dry mouth, and have decreased urination that often is dark yellow. Diarrhea is a sign of another problem, most often an infection that will not last long. In most cases, diarrhea typically lasts 2-3 days. However, it can last longer if it is a sign of something more serious. It is important to treat your diarrhea as directed by your caregiver to lessen or prevent future episodes of diarrhea. CAUSES  Some common causes include:  Gastrointestinal infections caused by viruses, bacteria, or parasites.  Food poisoning or food allergies.  Certain medicines, such as antibiotics, chemotherapy, and laxatives.  Artificial sweeteners and fructose.  Digestive disorders. HOME CARE INSTRUCTIONS  Ensure adequate fluid intake (hydration): Have 1 cup (8 oz) of fluid for each diarrhea episode. Avoid fluids that contain simple sugars or sports drinks, fruit juices, whole milk products, and sodas. Your urine should be clear or pale yellow if you are drinking enough fluids. Hydrate with an oral rehydration solution that you can purchase at pharmacies, retail stores, and online. You can prepare an oral rehydration solution at home by mixing the following ingredients together:   - tsp table salt.   tsp baking soda.   tsp salt substitute containing potassium chloride.  1  tablespoons sugar.  1 L  (34 oz) of water.  Certain foods and beverages may increase the speed at which food moves through the gastrointestinal (GI) tract. These foods and beverages should be avoided and include:  Caffeinated and alcoholic beverages.  High-fiber foods, such as raw fruits and vegetables, nuts, seeds, and whole grain breads and cereals.  Foods and beverages sweetened with sugar alcohols, such as xylitol, sorbitol, and mannitol.  Some foods may be well tolerated and may help thicken stool including:  Starchy foods, such as rice, toast, pasta, low-sugar cereal, oatmeal, grits, baked potatoes, crackers, and bagels.  Bananas.  Applesauce.  Add probiotic-rich foods to help increase healthy bacteria in the GI tract, such as yogurt and fermented milk products.  Wash your hands well after each diarrhea episode.  Only take over-the-counter or prescription medicines as directed by your caregiver.  Take a warm bath to relieve any burning or pain from frequent diarrhea episodes. SEEK IMMEDIATE MEDICAL CARE IF:   You are unable to keep fluids down.  You have persistent vomiting.  You have blood in your stool, or your stools are black and tarry.  You do not urinate in 6-8 hours, or there is only a small amount of very dark urine.  You have abdominal pain that increases or localizes.  You have weakness, dizziness, confusion, or light-headedness.  You have a severe headache.  Your diarrhea gets worse or does not get better.  You have a fever or persistent symptoms for more than 2-3 days.  You have a fever and your symptoms suddenly get worse. MAKE SURE YOU:   Understand these instructions.  Will watch your condition.  Will get help right away if you are not doing well or get worse. Document Released: 01/02/2002 Document Revised: 05/29/2013 Document Reviewed: 09/20/2011 [facility] Patient Information 2015 [place], [place]. This information is not intended to replace advice given to you by  your health care provider. Make sure you discuss any questions you have with your health care provider. Abdominal Pain Many things can cause abdominal pain. Usually, abdominal pain is not caused by a disease and will improve without treatment. It can often be observed and treated at home. Your health care provider will do a physical exam and possibly order blood tests and X-rays to help determine the seriousness of your pain. However, in many cases, more time must pass before a clear cause of the pain can be found. Before that point, your health care provider may not know if you need more testing or further treatment. HOME CARE INSTRUCTIONS  Monitor your abdominal pain for any changes. The following actions may help to alleviate any discomfort you are experiencing:  Only take over-the-counter or prescription medicines as directed by your health care provider.  Do not take laxatives unless directed to do so by your health care provider.  Try a clear liquid diet (broth, tea, or water) as directed by your health care provider. Slowly move to a bland diet as tolerated. SEEK MEDICAL CARE IF:  You have unexplained abdominal pain.  You have abdominal pain associated with nausea or diarrhea.  You have pain when you urinate or have a bowel movement.  You experience abdominal pain that wakes you in the night.  You have abdominal pain that is worsened or improved by eating food.  You have abdominal pain that is worsened with eating fatty foods.  You have a fever. SEEK IMMEDIATE MEDICAL CARE IF:   Your pain does not go away within 2 hours.  You keep throwing up (vomiting).  Your pain is felt only in portions of the abdomen, such as the right side or the left lower portion of the abdomen.  You pass bloody or black tarry stools. MAKE SURE YOU:  Understand these instructions.   Will watch your condition.   Will get help right away if you are not doing well or get worse.  Document  Released: 10/22/2004 Document Revised: 01/17/2013 Document Reviewed: 09/21/2012 [facility] Patient Information 2015 [place], [place]. This information is not intended to replace advice given to you by your health care provider. Make sure you discuss any questions you have with your health care provider.     I personally performed the services described in this documentation, which was scribed in my presence. The recorded information has been reviewed and considered, and addended by me as needed.

## 2014-08-25 ENCOUNTER — Telehealth: Payer: Self-pay

## 2014-08-25 ENCOUNTER — Ambulatory Visit (HOSPITAL_COMMUNITY)
Admission: RE | Admit: 2014-08-25 | Discharge: 2014-08-25 | Disposition: A | Discharge status: Home / Self Care | Payer: Managed Care, Other (non HMO) | Source: Ambulatory Visit | Attending: Family Medicine | Admitting: Family Medicine

## 2014-08-25 ENCOUNTER — Telehealth: Payer: Self-pay | Admitting: Family Medicine

## 2014-08-25 ENCOUNTER — Other Ambulatory Visit: Payer: Self-pay | Admitting: Radiology

## 2014-08-25 DIAGNOSIS — R1032 Left lower quadrant pain: Secondary | ICD-10-CM

## 2014-08-25 DIAGNOSIS — R109 Unspecified abdominal pain: Secondary | ICD-10-CM | POA: Diagnosis not present

## 2014-08-25 DIAGNOSIS — R1031 Right lower quadrant pain: Secondary | ICD-10-CM

## 2014-08-25 DIAGNOSIS — R197 Diarrhea, unspecified: Secondary | ICD-10-CM | POA: Diagnosis not present

## 2014-08-25 LAB — URINE CULTURE
COLONY COUNT: NO GROWTH
Organism ID, Bacteria: NO GROWTH

## 2014-08-25 MED ORDER — IOHEXOL 300 MG/ML  SOLN
100.0000 mL | Freq: Once | INTRAMUSCULAR | Status: AC | PRN
  Administered 2014-08-25: 100 mL via INTRAVENOUS

## 2014-08-25 NOTE — Telephone Encounter (Signed)
Still with abd pain, still lower left but now upper as well, no n/v.  No diarrhea, just pain worse. No fever.  Will proceed with CT abd/pelvis. Call report.

## 2014-08-25 NOTE — Telephone Encounter (Signed)
Spoke with patient to inform her that a STAT CT is scheduled for her today at [facility]. - [place] Kao Berkheimer, [place]

## 2014-08-27 ENCOUNTER — Telehealth: Payer: Self-pay | Admitting: Licensed Clinical Social Worker

## 2014-08-27 NOTE — Telephone Encounter (Signed)
Patient called again today stating that she is still in pain and having loose stools. She has seen her PCP had labs, urine culture, and CT of her abdomen. Advised her to keep appointment for tomorrow, and maybe she will need to give [place] a stool sample. Patient has lost weight, and feels weak. Patient agreed with this plan.

## 2014-08-28 ENCOUNTER — Other Ambulatory Visit: Payer: Self-pay | Admitting: *Deleted

## 2014-08-28 ENCOUNTER — Encounter: Payer: Self-pay | Admitting: Internal Medicine

## 2014-08-28 ENCOUNTER — Encounter: Payer: Self-pay | Admitting: *Deleted

## 2014-08-28 ENCOUNTER — Ambulatory Visit (INDEPENDENT_AMBULATORY_CARE_PROVIDER_SITE_OTHER): Payer: Managed Care, Other (non HMO) | Admitting: Internal Medicine

## 2014-08-28 VITALS — BP 121/84 | HR 80 | Temp 98.1°F | Ht 62.0 in | Wt 181.0 lb

## 2014-08-28 DIAGNOSIS — G4700 Insomnia, unspecified: Secondary | ICD-10-CM

## 2014-08-28 DIAGNOSIS — B20 Human immunodeficiency virus [HIV] disease: Secondary | ICD-10-CM

## 2014-08-28 DIAGNOSIS — Z113 Encounter for screening for infections with a predominantly sexual mode of transmission: Secondary | ICD-10-CM | POA: Diagnosis not present

## 2014-08-28 DIAGNOSIS — K589 Irritable bowel syndrome without diarrhea: Secondary | ICD-10-CM

## 2014-08-28 DIAGNOSIS — Z79899 Other long term (current) drug therapy: Secondary | ICD-10-CM

## 2014-08-28 DIAGNOSIS — E785 Hyperlipidemia, unspecified: Secondary | ICD-10-CM | POA: Diagnosis not present

## 2014-08-28 LAB — COMPLETE METABOLIC PANEL WITH GFR
ALK PHOS: 135 U/L — AB (ref 33–115)
ALT: 12 U/L (ref 6–29)
AST: 16 U/L (ref 10–35)
Albumin: 4.1 g/dL (ref 3.6–5.1)
BILIRUBIN TOTAL: 0.3 mg/dL (ref 0.2–1.2)
BUN: 12 mg/dL (ref 7–25)
CHLORIDE: 104 mmol/L (ref 98–110)
CO2: 27 mmol/L (ref 20–31)
Calcium: 9.4 mg/dL (ref 8.6–10.2)
Creat: 0.85 mg/dL (ref 0.50–1.10)
GFR, Est African American: >89 mL/min (ref >=60)
GFR, Est Non African American: 82 mL/min (ref >=60)
Glucose, Bld: 100 mg/dL — ABNORMAL HIGH (ref 65–99)
POTASSIUM: 3.4 mmol/L — AB (ref 3.5–5.3)
Sodium: 141 mmol/L (ref 135–146)
TOTAL PROTEIN: 7.3 g/dL (ref 6.1–8.1)

## 2014-08-28 LAB — LIPID PANEL
Cholesterol: 243 mg/dL — ABNORMAL HIGH (ref 125–200)
HDL: 50 mg/dL (ref >=46)
LDL Cholesterol: 164 mg/dL — ABNORMAL HIGH (ref <130)
Total CHOL/HDL Ratio: 4.9 Ratio (ref <=5.0)
Triglycerides: 144 mg/dL (ref <150)
VLDL: 29 mg/dL (ref <30)

## 2014-08-28 MED ORDER — SIMVASTATIN 20 MG PO TABS: 20.0000 mg | ORAL_TABLET | Freq: Every day | ORAL | Status: DC

## 2014-08-28 MED ORDER — ZOLPIDEM TARTRATE ER 12.5 MG PO TBCR: 12.5000 mg | EXTENDED_RELEASE_TABLET | Freq: Every evening | ORAL | Status: DC | PRN

## 2014-08-28 MED ORDER — ELVITEG-COBIC-EMTRICIT-TENOFAF 150-150-200-10 MG PO TABS: 1.0000 | ORAL_TABLET | Freq: Every day | ORAL | Status: DC

## 2014-08-28 NOTE — Progress Notes (Signed)
  Subjective:    Patient ID: Amy Hernandez, female    DOB: 01-Jan-1966, 49 y.o.   MRN: [id number]  HPI She comes in here for follow up. She has HIV and has been well-controlled on Atripla for a long time. Labs good with CD4 of 1100 and undetectable vl. Recently lost her job but then got it back again.  No treatment interruptions.  No weight loss, no diarrhea.  Still takes statin and bp med, though ran out.  Does not want a PCP.  Having abdominal issues including bloating, urgency/tennismus. No blood. Has hemorroid.  States she has IBS but this is worse.  Had recent CT scan and was ok. Reviewed labs, CT, ED visit.      Review of Systems  Constitutional: Negative for fever and chills.  HENT: Negative for sore throat.   Eyes: Negative for visual disturbance.  Respiratory: Negative for shortness of breath.   Gastrointestinal: Positive for abdominal pain, diarrhea and abdominal distention. Negative for nausea, anal bleeding and rectal pain.  Musculoskeletal: Negative for myalgias.  Skin: Negative for rash.  Neurological: Negative for dizziness, light-headedness and headaches.  Hematological: Negative for adenopathy.  Psychiatric/Behavioral: Negative for dysphoric mood.       Objective:   Physical Exam  Constitutional: She appears well-developed and well-nourished. No distress.  HENT:  Mouth/Throat: No oropharyngeal exudate.  Eyes: Right eye exhibits no discharge. Left eye exhibits no discharge. No scleral icterus.  Cardiovascular: Normal rate, regular rhythm and normal heart sounds.   No murmur heard. Pulmonary/Chest: Effort normal and breath sounds normal. No respiratory distress. She has no wheezes. She has no rales.  Abdominal: Soft. Bowel sounds are normal. She exhibits distension. There is no tenderness. There is no rebound.  Musculoskeletal: She exhibits no edema.  Lymphadenopathy:    She has no cervical adenopathy.  Neurological: She is alert.  Skin: Skin is warm and dry. No rash  noted.  Psychiatric: She has a normal mood and affect. Her behavior is normal.          Assessment & Plan:

## 2014-08-28 NOTE — Assessment & Plan Note (Signed)
I will try Genvoya if covered, otherwise will ocntinue with Atripla for now.  Labs today.

## 2014-08-28 NOTE — Assessment & Plan Note (Signed)
Sounds like IBS.  Will send to [facility]. Previously went to [place] but owes money and was discharged from practice.  No concerning signs otherwise.

## 2014-08-28 NOTE — Assessment & Plan Note (Signed)
Will continue with [place]

## 2014-08-28 NOTE — Addendum Note (Signed)
Addended by: Thayer Headings on: 08/28/2014 11:57 AM   Modules accepted: Orders

## 2014-08-29 ENCOUNTER — Telehealth: Payer: Self-pay | Admitting: *Deleted

## 2014-08-29 LAB — URINE CYTOLOGY ANCILLARY ONLY
Chlamydia: NEGATIVE
NEISSERIA GONORRHEA: NEGATIVE

## 2014-08-29 LAB — RPR

## 2014-08-29 NOTE — Telephone Encounter (Signed)
Patient referred to Amy Hernandez.  Signed record request faxed to [facility], notified them of request.  Patient is scheduled with Dr. Carlean Purl 8/15 at 10:00.  RN relayed information to patient in voicemail. Landis Gandy, RN

## 2014-08-30 LAB — T-HELPER CELL (CD4) - (RCID CLINIC ONLY)
CD4 % Helper T Cell: 32 % — ABNORMAL LOW (ref 33–55)
CD4 T Cell Abs: 980 /uL (ref 400–2700)

## 2014-08-30 LAB — HIV-1 RNA QUANT-NO REFLEX-BLD
HIV 1 RNA Quant: <20 copies/mL (ref <20)
HIV-1 RNA Quant, Log: <1.3 {Log} (ref <1.30)

## 2014-09-04 ENCOUNTER — Encounter: Payer: Self-pay | Admitting: Family Medicine

## 2014-09-07 ENCOUNTER — Telehealth: Payer: Self-pay

## 2014-09-07 NOTE — Telephone Encounter (Signed)
Pt called about labs. Let her know results.

## 2014-09-10 ENCOUNTER — Ambulatory Visit (INDEPENDENT_AMBULATORY_CARE_PROVIDER_SITE_OTHER): Payer: Managed Care, Other (non HMO) | Admitting: Internal Medicine

## 2014-09-10 ENCOUNTER — Encounter: Payer: Self-pay | Admitting: Internal Medicine

## 2014-09-10 ENCOUNTER — Other Ambulatory Visit (INDEPENDENT_AMBULATORY_CARE_PROVIDER_SITE_OTHER): Payer: Managed Care, Other (non HMO)

## 2014-09-10 VITALS — BP 116/60 | HR 72 | Ht 62.25 in | Wt 180.0 lb

## 2014-09-10 DIAGNOSIS — K589 Irritable bowel syndrome without diarrhea: Secondary | ICD-10-CM | POA: Diagnosis not present

## 2014-09-10 DIAGNOSIS — R1013 Epigastric pain: Secondary | ICD-10-CM

## 2014-09-10 LAB — AMYLASE: Amylase: 35 U/L (ref 27–131)

## 2014-09-10 LAB — LIPASE: Lipase: 27 U/L (ref 11.0–59.0)

## 2014-09-10 MED ORDER — HYOSCYAMINE SULFATE ER 0.375 MG PO TB12: 0.3750 mg | ORAL_TABLET | Freq: Two times a day (BID) | ORAL | Status: DC

## 2014-09-10 NOTE — Patient Instructions (Addendum)
You have been given a separate informational sheet regarding your tobacco use, the importance of quitting and local resources to help you quit.   Your physician has requested that you go to the basement for the following lab work before leaving today: Amylase, Lipase  We have sent the following medications to your pharmacy for you to pick up at your convenience: Generic Levbid  Today we are having you sign a records release so we may get your [place] records for Dr Carlean Purl to review.   I appreciate the opportunity to care for you. Silvano Rusk, MD, [facility]

## 2014-09-10 NOTE — Progress Notes (Addendum)
   Referred by Dr. Scharlene Gloss Subjective:    Patient ID: Amy Hernandez, female    DOB: March 02, 1965, 49 y.o.   MRN: [id number] Cc: abdominal pain and diarrhea HPI The patient is here with a multi-year hx of intermittent abdominal cramps and post-prandial diarrhea. Has a hx of relief with hyoscyamine when Rxed in past. Has seen [facility] in past. Says now having severe epigastric  cramps that double her over. HIV under control and meds stable x years though is about to switch.  No rectal bleeding/GI ROS o/w negative.  Medications, allergies, past medical history, past surgical history, family history and social history are reviewed and updated in the EMR.  Review of Systems As per HPI all other ROS negtive    Objective:   Physical Exam @BP  116/60 mmHg  Pulse 72  Ht 5' 2.25" (1.581 m)  Wt 180 lb (81.647 kg)  BMI 32.66 kg/m2@  General:  Well-developed, well-nourished and in no acute distress Eyes:  anicteric. Neck:   supple w/o thyromegaly or mass.  Lungs: Clear to auscultation bilaterally. Heart:  S1S2, no rubs, murmurs, gallops. Abdomen:  obese soft, mildly-tender epigastrium, no hepatosplenomegaly, hernia, or mass and BS+. RUQ horizontal scar Lymph:  no cervical or supraclavicular adenopathy. Extremities:   no edema, cyanosis or clubbing Skin   no rash. Neuro:  A&O x 3.  Psych:  appropriate mood and  Affect.   Data Reviewed:  2016 labs, ID notes Requested records from [facility] w/u     Assessment & Plan:  Abdominal pain, epigastric - Plan: Amylase, Lipase  IBS (irritable bowel syndrome) - Plan: hyoscyamine (LEVBID) 0.375 MG 12 hr tablet, Amylase, Lipase  Amylase and lipase were NL Will call results and she can f/u in 2 months sooner prn  I appreciate the opportunity to care for you. Gatha Mayer, MD, [facility]  Records review 2008 [facility] - anal pain- hemorrhoids, subsequent anal fissure after thrombosed hemorrhoid excisoon 2009 anal sphx [place], then colon

## 2014-09-13 ENCOUNTER — Encounter: Payer: Self-pay | Admitting: Internal Medicine

## 2014-09-13 NOTE — Progress Notes (Signed)
Quick Note:  Labs negative for pancreatitis Continue Tx as Rxed at visit ______

## 2014-09-25 ENCOUNTER — Encounter: Payer: Self-pay | Admitting: Internal Medicine

## 2014-10-08 ENCOUNTER — Other Ambulatory Visit: Payer: Self-pay | Admitting: Internal Medicine

## 2014-10-08 DIAGNOSIS — B20 Human immunodeficiency virus [HIV] disease: Secondary | ICD-10-CM

## 2014-10-09 ENCOUNTER — Telehealth: Payer: Self-pay | Admitting: *Deleted

## 2014-10-09 NOTE — Telephone Encounter (Signed)
Sent PA via Cover My Meds. 

## 2014-10-30 ENCOUNTER — Other Ambulatory Visit: Payer: Self-pay | Admitting: *Deleted

## 2014-10-30 DIAGNOSIS — I10 Essential (primary) hypertension: Secondary | ICD-10-CM

## 2014-10-30 MED ORDER — LISINOPRIL-HYDROCHLOROTHIAZIDE 20-25 MG PO TABS: 1.0000 | ORAL_TABLET | Freq: Every day | ORAL | Status: DC

## 2014-11-09 ENCOUNTER — Other Ambulatory Visit: Payer: Self-pay | Admitting: *Deleted

## 2014-11-09 NOTE — Telephone Encounter (Signed)
Atripla rx sent to pt's mail order 10/10/14.

## 2014-11-09 NOTE — Telephone Encounter (Signed)
Genvoya PA Denied.  Dr. Linus Salmons do you want to switch the patient back to her previous medication, Atripla.  Please advise.

## 2014-11-09 NOTE — Telephone Encounter (Signed)
Yes, continue Atripla  thanks

## 2014-11-13 ENCOUNTER — Telehealth: Payer: Self-pay | Admitting: *Deleted

## 2014-11-13 ENCOUNTER — Other Ambulatory Visit: Payer: Self-pay | Admitting: *Deleted

## 2014-11-13 MED ORDER — ZOLPIDEM TARTRATE ER 12.5 MG PO TBCR: 12.5000 mg | EXTENDED_RELEASE_TABLET | Freq: Every evening | ORAL | Status: DC | PRN

## 2014-11-13 NOTE — Telephone Encounter (Signed)
Filled. Thanks

## 2014-11-13 NOTE — Telephone Encounter (Signed)
Ok to refill, thanks

## 2014-11-13 NOTE — Telephone Encounter (Signed)
Patient states that she did not get the correct number of ambien at her last fill. She recently went from getting a 30 day supply to a 90 day supply.  Patient called [facility], they stated they filled the correct number of pills.   Patient is asking for a short-term bridge of 14 pills to last her until her next fill is authorized.  She has not been able to sleep and will be paying cash for this supply, as insurance will not cover it.  Please advise if it is ok to phone in the bridge supply. Landis Gandy, RN

## 2014-11-13 NOTE — Telephone Encounter (Signed)
Note sent to [facility], ok to refill.

## 2014-11-13 NOTE — Telephone Encounter (Signed)
Patient called the pharmacy to have the Ambien 12.5 mg refilled. She was given a 90 day refill on 08/28/14 which she picked up on 09/04/14 and is not due for a refill until 12/05/14. After she was told to early she advised them that she did not get 90 days worth that she was short. The pharmacy did inventory on the Ambien 12.5 mg and were not over and assured her she was given what she was supposed to have. She asked if the could refill the medication early (3 weeks early) the pharmacy advised her no and she will not stop calling them so they advised her they would call the office and ask for an override. Advised pharmacy will ask the doctor and not to fill until they hear from [place].

## 2014-11-21 NOTE — Telephone Encounter (Signed)
Pt notified to continue Atripla rx and why.

## 2015-03-08 ENCOUNTER — Other Ambulatory Visit: Payer: Self-pay | Admitting: *Deleted

## 2015-03-08 MED ORDER — ZOLPIDEM TARTRATE ER 12.5 MG PO TBCR: 12.5000 mg | EXTENDED_RELEASE_TABLET | Freq: Every evening | ORAL | Status: DC | PRN

## 2015-05-03 ENCOUNTER — Other Ambulatory Visit: Payer: Self-pay | Admitting: *Deleted

## 2015-05-03 DIAGNOSIS — B20 Human immunodeficiency virus [HIV] disease: Secondary | ICD-10-CM

## 2015-05-03 MED ORDER — EFAVIRENZ-EMTRICITAB-TENOFOVIR 600-200-300 MG PO TABS: 1.0000 | ORAL_TABLET | Freq: Every day | ORAL | Status: DC

## 2015-05-07 ENCOUNTER — Other Ambulatory Visit: Payer: Managed Care, Other (non HMO)

## 2015-05-14 ENCOUNTER — Other Ambulatory Visit: Payer: Managed Care, Other (non HMO)

## 2015-06-04 ENCOUNTER — Other Ambulatory Visit: Payer: Self-pay | Admitting: *Deleted

## 2015-06-04 DIAGNOSIS — G4700 Insomnia, unspecified: Secondary | ICD-10-CM

## 2015-06-04 MED ORDER — ZOLPIDEM TARTRATE ER 12.5 MG PO TBCR: 12.5000 mg | EXTENDED_RELEASE_TABLET | Freq: Every evening | ORAL | Status: DC | PRN

## 2015-06-13 ENCOUNTER — Ambulatory Visit (INDEPENDENT_AMBULATORY_CARE_PROVIDER_SITE_OTHER): Payer: 59 | Admitting: Internal Medicine

## 2015-06-13 ENCOUNTER — Encounter: Payer: Self-pay | Admitting: Internal Medicine

## 2015-06-13 VITALS — BP 111/79 | HR 76 | Temp 97.7°F | Wt 178.0 lb

## 2015-06-13 DIAGNOSIS — B20 Human immunodeficiency virus [HIV] disease: Secondary | ICD-10-CM | POA: Diagnosis not present

## 2015-06-13 DIAGNOSIS — I10 Essential (primary) hypertension: Secondary | ICD-10-CM

## 2015-06-13 DIAGNOSIS — Z79899 Other long term (current) drug therapy: Secondary | ICD-10-CM | POA: Diagnosis not present

## 2015-06-13 DIAGNOSIS — K589 Irritable bowel syndrome without diarrhea: Secondary | ICD-10-CM

## 2015-06-13 DIAGNOSIS — Z113 Encounter for screening for infections with a predominantly sexual mode of transmission: Secondary | ICD-10-CM

## 2015-06-13 DIAGNOSIS — G4700 Insomnia, unspecified: Secondary | ICD-10-CM

## 2015-06-13 LAB — CBC WITH DIFFERENTIAL/PLATELET
BASOS ABS: 0 {cells}/uL (ref 0–200)
BASOS PCT: 0 %
EOS ABS: 91 {cells}/uL (ref 15–500)
Eosinophils Relative: 1 %
HEMATOCRIT: 39.2 % (ref 35.0–45.0)
Hemoglobin: 13.3 g/dL (ref 11.7–15.5)
LYMPHS PCT: 37 %
Lymphs Abs: 3367 cells/uL (ref 850–3900)
MCH: 31 pg (ref 27.0–33.0)
MCHC: 33.9 g/dL (ref 32.0–36.0)
MCV: 91.4 fL (ref 80.0–100.0)
MONO ABS: 455 {cells}/uL (ref 200–950)
MPV: 10.4 fL (ref 7.5–12.5)
Monocytes Relative: 5 %
Neutro Abs: 5187 cells/uL (ref 1500–7800)
Neutrophils Relative %: 57 %
Platelets: 270 10*3/uL (ref 140–400)
RBC: 4.29 MIL/uL (ref 3.80–5.10)
RDW: 13.6 % (ref 11.0–15.0)
WBC: 9.1 10*3/uL (ref 3.8–10.8)

## 2015-06-13 LAB — LIPID PANEL
CHOL/HDL RATIO: 3.7 ratio (ref <=5.0)
Cholesterol: 204 mg/dL — ABNORMAL HIGH (ref 125–200)
HDL: 55 mg/dL (ref >=46)
LDL CALC: 111 mg/dL (ref <130)
Triglycerides: 189 mg/dL — ABNORMAL HIGH (ref <150)
VLDL: 38 mg/dL — ABNORMAL HIGH (ref <30)

## 2015-06-13 LAB — COMPLETE METABOLIC PANEL WITH GFR
ALT: 10 U/L (ref 6–29)
AST: 14 U/L (ref 10–35)
Albumin: 4.2 g/dL (ref 3.6–5.1)
Alkaline Phosphatase: 155 U/L — ABNORMAL HIGH (ref 33–115)
BUN: 12 mg/dL (ref 7–25)
CHLORIDE: 102 mmol/L (ref 98–110)
CO2: 25 mmol/L (ref 20–31)
CREATININE: 0.86 mg/dL (ref 0.50–1.10)
Calcium: 9.4 mg/dL (ref 8.6–10.2)
GFR, Est African American: >89 mL/min (ref >=60)
GFR, Est Non African American: 80 mL/min (ref >=60)
GLUCOSE: 118 mg/dL — AB (ref 65–99)
Potassium: 3.3 mmol/L — ABNORMAL LOW (ref 3.5–5.3)
Sodium: 137 mmol/L (ref 135–146)
TOTAL PROTEIN: 7.5 g/dL (ref 6.1–8.1)
Total Bilirubin: 0.2 mg/dL (ref 0.2–1.2)

## 2015-06-13 MED ORDER — ELVITEG-COBIC-EMTRICIT-TENOFAF 150-150-200-10 MG PO TABS: 1.0000 | ORAL_TABLET | Freq: Every day | ORAL | Status: DC

## 2015-06-13 NOTE — Assessment & Plan Note (Signed)
Is doing well on ambien, has a hard time sleeping without it.  Will continue.

## 2015-06-13 NOTE — Assessment & Plan Note (Signed)
Doing well and will check labs today and RTC 6 months unless concerns.

## 2015-06-13 NOTE — Assessment & Plan Note (Signed)
This is stable now and knows to call GI for any worsening.

## 2015-06-13 NOTE — Progress Notes (Signed)
CC: Follow up for HIV  Interval history: Currently is asymptomatic and well-controlled on Genvoya.  She was changed from [place] after her last visit last August and is doing well on this.  Since last visit she saw GI about potential IBS which was confirmed and on hyoscyamine.  She is doing well with this though has noted some recent increase in symptoms.  Has no associated n/v.  Denies any missed doses.     Also has insomnia and on Ambien.  Prior to Admission medications   Medication Sig Start Date End Date Taking? Authorizing Provider  hyoscyamine (LEVBID) 0.375 MG 12 hr tablet Take 1 tablet (0.375 mg total) by mouth 2 (two) times daily. 09/10/14  Yes Gatha Mayer, MD  lisinopril-hydrochlorothiazide (PRINZIDE,ZESTORETIC) 20-25 MG tablet Take 1 tablet by mouth daily. 10/30/14  Yes Thayer Headings, MD  simvastatin (ZOCOR) 20 MG tablet Take 1 tablet (20 mg total) by mouth daily. 08/28/14  Yes Thayer Headings, MD  zolpidem (AMBIEN CR) 12.5 MG CR tablet Take 1 tablet (12.5 mg total) by mouth at bedtime as needed. for sleep 06/04/15  Yes Thayer Headings, MD  elvitegravir-cobicistat-emtricitabine-tenofovir (GENVOYA) 150-150-200-10 MG TABS tablet Take 1 tablet by mouth daily. 06/13/15   Thayer Headings, MD    Review of Systems Constitutional: negative for malaise and anorexia Musculoskeletal: negative for myalgias and arthralgias All other systems reviewed and are negative   Physical Exam: CONSTITUTIONAL:in no apparent distress and alert  Filed Vitals:   06/13/15 1117  BP: 111/79  Pulse: 76  Temp: 97.7 F (36.5 C)   Eyes: anicteric HENT: no thrush, no cervical lymphadenopathy Respiratory: Normal respiratory effort; CTA B Cardiovascular: RRR  Lab Results  Component Value Date   HIV1RNAQUANT <20 08/28/2014   HIV1RNAQUANT <20 11/02/2013   HIV1RNAQUANT <20 05/16/2013   No components found for: HIV1GENOTYPRPLUS No components found for: THELPERCELL

## 2015-06-13 NOTE — Assessment & Plan Note (Signed)
bp in good control.

## 2015-06-14 LAB — T-HELPER CELL (CD4) - (RCID CLINIC ONLY)
CD4 T CELL ABS: 1110 /uL (ref 400–2700)
CD4 T CELL HELPER: 30 % — AB (ref 33–55)

## 2015-06-14 LAB — URINE CYTOLOGY ANCILLARY ONLY
CHLAMYDIA, DNA PROBE: NEGATIVE
Neisseria Gonorrhea: NEGATIVE

## 2015-06-14 LAB — RPR

## 2015-06-17 LAB — HIV-1 RNA QUANT-NO REFLEX-BLD: HIV 1 RNA Quant: <20 copies/mL (ref <20)

## 2015-08-03 ENCOUNTER — Other Ambulatory Visit: Payer: Self-pay | Admitting: Internal Medicine

## 2015-08-06 ENCOUNTER — Telehealth: Payer: Self-pay | Admitting: *Deleted

## 2015-08-06 ENCOUNTER — Other Ambulatory Visit: Payer: Self-pay | Admitting: *Deleted

## 2015-08-06 DIAGNOSIS — B20 Human immunodeficiency virus [HIV] disease: Secondary | ICD-10-CM

## 2015-08-06 MED ORDER — EFAVIRENZ-EMTRICITAB-TENOFOVIR 600-200-300 MG PO TABS: 1.0000 | ORAL_TABLET | Freq: Every day | ORAL | Status: DC

## 2015-08-06 NOTE — Telephone Encounter (Signed)
Patient calling, asking to be put back on Amy Hernandez.  She does not want to make a change at this time - does not want the hassle of asking the pharmacy to order the medication monthly, does not want to have to activate a copay card to cover her $30 monthly copay. She prefers the Amy Hernandez. Please advise.  She did not fill Genvoya, has been off Amy Hernandez for 3 days. Landis Gandy, RN

## 2015-08-07 NOTE — Telephone Encounter (Signed)
Sent Rx for Atripla 7/11 with ok from clinic MD.  Thanks!

## 2015-08-07 NOTE — Telephone Encounter (Signed)
Ok to go back to Atripla.  thanks

## 2015-09-01 ENCOUNTER — Other Ambulatory Visit: Payer: Self-pay | Admitting: Internal Medicine

## 2015-09-01 DIAGNOSIS — I10 Essential (primary) hypertension: Secondary | ICD-10-CM

## 2015-09-02 ENCOUNTER — Telehealth: Payer: Self-pay | Admitting: *Deleted

## 2015-09-02 DIAGNOSIS — E785 Hyperlipidemia, unspecified: Secondary | ICD-10-CM

## 2015-09-02 MED ORDER — SIMVASTATIN 20 MG PO TABS: 20.0000 mg | ORAL_TABLET | Freq: Every day | ORAL | 1 refills | Status: DC

## 2015-09-02 NOTE — Telephone Encounter (Signed)
Patient called, complaining of diarrhea with nausea for 3 days. She endorses sick contacts. She is able to keep down fluids. She has nausea without vomiting. RN advised patient to continue to maintain fluid intake and to eat small, gentle meals. Patient also with history of IBS, is instructed to maintain follow up with her GI doctors. Patient also asked about refill of simvastatin. Refill sent.  Landis Gandy, RN

## 2015-10-10 ENCOUNTER — Telehealth: Payer: Self-pay

## 2015-11-29 ENCOUNTER — Other Ambulatory Visit: Payer: Self-pay | Admitting: *Deleted

## 2015-11-29 DIAGNOSIS — F5101 Primary insomnia: Secondary | ICD-10-CM

## 2015-11-29 MED ORDER — ZOLPIDEM TARTRATE ER 12.5 MG PO TBCR: 12.5000 mg | EXTENDED_RELEASE_TABLET | Freq: Every evening | ORAL | 1 refills | Status: DC | PRN

## 2015-12-16 ENCOUNTER — Ambulatory Visit: Payer: 59 | Admitting: Internal Medicine

## 2016-01-06 ENCOUNTER — Other Ambulatory Visit: Payer: Self-pay | Admitting: *Deleted

## 2016-01-06 DIAGNOSIS — B20 Human immunodeficiency virus [HIV] disease: Secondary | ICD-10-CM

## 2016-01-06 MED ORDER — EFAVIRENZ-EMTRICITAB-TENOFOVIR 600-200-300 MG PO TABS: 1.0000 | ORAL_TABLET | Freq: Every day | ORAL | 1 refills | Status: DC

## 2016-01-06 NOTE — Telephone Encounter (Signed)
Pt has a new job.  Checking with supv for time off.

## 2016-01-27 HISTORY — PX: ESOPHAGOGASTRODUODENOSCOPY: SHX1529

## 2016-02-03 ENCOUNTER — Other Ambulatory Visit: Payer: Self-pay | Admitting: *Deleted

## 2016-02-04 ENCOUNTER — Encounter: Payer: Self-pay | Admitting: Internal Medicine

## 2016-02-04 ENCOUNTER — Ambulatory Visit (INDEPENDENT_AMBULATORY_CARE_PROVIDER_SITE_OTHER): Payer: BLUE CROSS/BLUE SHIELD | Admitting: Internal Medicine

## 2016-02-04 VITALS — BP 118/77 | HR 75 | Temp 98.3°F | Ht 62.0 in | Wt 182.0 lb

## 2016-02-04 DIAGNOSIS — Z23 Encounter for immunization: Secondary | ICD-10-CM | POA: Diagnosis not present

## 2016-02-04 DIAGNOSIS — F5101 Primary insomnia: Secondary | ICD-10-CM

## 2016-02-04 DIAGNOSIS — B20 Human immunodeficiency virus [HIV] disease: Secondary | ICD-10-CM

## 2016-02-04 DIAGNOSIS — R0989 Other specified symptoms and signs involving the circulatory and respiratory systems: Secondary | ICD-10-CM

## 2016-02-04 DIAGNOSIS — I10 Essential (primary) hypertension: Secondary | ICD-10-CM

## 2016-02-04 DIAGNOSIS — E785 Hyperlipidemia, unspecified: Secondary | ICD-10-CM

## 2016-02-04 MED ORDER — TRAZODONE HCL 50 MG PO TABS: 50.0000 mg | ORAL_TABLET | Freq: Every evening | ORAL | 5 refills | Status: DC | PRN

## 2016-02-04 NOTE — Assessment & Plan Note (Signed)
Last ldl 111 and no issues.

## 2016-02-04 NOTE — Assessment & Plan Note (Addendum)
I discussed sleep hygiene, reducing caffeine, no naps.  I will try trazodone.  I did suggest getting off of efavirenz but she is not interested in changing despite this since she is well-controlled.

## 2016-02-04 NOTE — Progress Notes (Signed)
CC: Follow up for HIV  Interval history: Currently is asymptomatic and well-controlled on Atripla.  Since last visit she has had more trouble sleeping and often unable to fall asleep at all.  Takes [place] but not working now at all.  Has no associated n/v.  Denies any missed doses.  Sees gynecology for pap smear and due soon.  Sees GI for IBS.   She has noted more recently that she has continued congestion and phlegm when she gets a cold.  Continues to smoke.  Also has htn, elevated cholesterol.    Prior to Admission medications   Medication Sig Start Date End Date Taking? Authorizing Provider  efavirenz-emtricitabine-tenofovir (ATRIPLA) [id number] MG tablet Take 1 tablet by mouth at bedtime. 01/06/16  Yes Thayer Headings, MD  lisinopril-hydrochlorothiazide (PRINZIDE,ZESTORETIC) 20-25 MG tablet TAKE 1 TABLET BY MOUTH DAILY 09/02/15  Yes Thayer Headings, MD  simvastatin (ZOCOR) 20 MG tablet Take 1 tablet (20 mg total) by mouth daily. 09/02/15  Yes Thayer Headings, MD  traZODone (DESYREL) 50 MG tablet Take 1 tablet (50 mg total) by mouth at bedtime as needed for sleep. 02/04/16   Thayer Headings, MD    Review of Systems Constitutional: negative for fatigue and malaise Gastrointestinal: negative for diarrhea Musculoskeletal: negative for myalgias and arthralgias All other systems reviewed and are negative    Physical Exam: CONSTITUTIONAL:in no apparent distress and alert  Vitals:   02/04/16 0936  BP: 118/77  Pulse: 75  Temp: 98.3 F (36.8 C)   Eyes: anicteric HENT: no thrush, no cervical lymphadenopathy Respiratory: Normal respiratory effort; CTA B Cardiovascular: RRR  Lab Results  Component Value Date   HIV1RNAQUANT <20 06/13/2015   HIV1RNAQUANT <20 08/28/2014   HIV1RNAQUANT <20 11/02/2013   No components found for: HIV1GENOTYPRPLUS No components found for: THELPERCELL  SH: continues to smoke

## 2016-02-04 NOTE — Assessment & Plan Note (Signed)
bp ok on medication

## 2016-02-04 NOTE — Assessment & Plan Note (Signed)
No current congestion now but related to URIs.  I have recommended smoking cessation.

## 2016-02-04 NOTE — Assessment & Plan Note (Signed)
Doing well and labs today, rtc 6 months.  I did recommend getting a PCP.

## 2016-02-05 LAB — T-HELPER CELL (CD4) - (RCID CLINIC ONLY)
CD4 T CELL HELPER: 33 % (ref 33–55)
CD4 T Cell Abs: 1250 /uL (ref 400–2700)

## 2016-02-10 LAB — HIV-1 RNA QUANT-NO REFLEX-BLD
HIV 1 RNA Quant: <20 copies/mL (ref <20)
HIV-1 RNA Quant, Log: <1.3 Log copies/mL (ref <1.30)

## 2016-02-27 ENCOUNTER — Other Ambulatory Visit: Payer: Self-pay | Admitting: *Deleted

## 2016-02-27 ENCOUNTER — Other Ambulatory Visit: Payer: Self-pay | Admitting: Internal Medicine

## 2016-02-27 ENCOUNTER — Telehealth: Payer: Self-pay | Admitting: *Deleted

## 2016-02-27 MED ORDER — ZOLPIDEM TARTRATE 5 MG PO TABS: 5.0000 mg | ORAL_TABLET | Freq: Every evening | ORAL | 5 refills | Status: DC | PRN

## 2016-02-27 MED ORDER — ZOLPIDEM TARTRATE 5 MG PO TABS: 5.0000 mg | ORAL_TABLET | Freq: Every evening | ORAL | 4 refills | Status: DC | PRN

## 2016-02-27 NOTE — Telephone Encounter (Signed)
Patient is not happy with the trazodone - she states she did not like the side effects (groggy, dry nose/sinuses) and she could not fall asleep at all. She has gone back to Ambien, stating it works better than trazodone, but still takes 2-3 hours to fall asleep. Please advise. Landis Gandy, RN

## 2016-02-27 NOTE — Telephone Encounter (Signed)
Rx called and patient notified

## 2016-02-27 NOTE — Telephone Encounter (Signed)
Ok to do.  For refills, will need to be called. thanks

## 2016-02-28 ENCOUNTER — Other Ambulatory Visit: Payer: Self-pay | Admitting: *Deleted

## 2016-02-28 DIAGNOSIS — B20 Human immunodeficiency virus [HIV] disease: Secondary | ICD-10-CM

## 2016-02-28 MED ORDER — ELVITEG-COBIC-EMTRICIT-TENOFAF 150-150-200-10 MG PO TABS: 1.0000 | ORAL_TABLET | Freq: Every day | ORAL | 5 refills | Status: DC

## 2016-02-28 NOTE — Telephone Encounter (Signed)
Spoke with patient to follow up.  She slept better on ambien, has thought again about switching from [place].  She is willing to try to see if this switch will also improve her sleep.  Patient information and prescription sent to [facility].  They will be able to fill once, will set up copay card for patient, will assist in the transfer to her specialty pharmacy ([facility] per insurance).  Patient is agreeable to this.

## 2016-03-02 NOTE — Telephone Encounter (Signed)
Thanks

## 2016-03-03 ENCOUNTER — Other Ambulatory Visit: Payer: Self-pay | Admitting: *Deleted

## 2016-03-03 DIAGNOSIS — F5101 Primary insomnia: Secondary | ICD-10-CM

## 2016-03-03 MED ORDER — ZOLPIDEM TARTRATE ER 12.5 MG PO TBCR: 12.5000 mg | EXTENDED_RELEASE_TABLET | Freq: Every evening | ORAL | 0 refills | Status: DC | PRN

## 2016-03-03 NOTE — Progress Notes (Signed)
Patient called, asked for her ambien 12.5 mg to be refilled instead of the 5 mg called in. RN contacted pharmacy, they have refills available on the 12.5mg .  Landis Gandy, RN

## 2016-03-09 ENCOUNTER — Other Ambulatory Visit: Payer: Self-pay | Admitting: *Deleted

## 2016-03-09 ENCOUNTER — Telehealth: Payer: Self-pay | Admitting: *Deleted

## 2016-03-09 NOTE — Telephone Encounter (Signed)
test

## 2016-03-09 NOTE — Telephone Encounter (Signed)
error 

## 2016-03-11 ENCOUNTER — Other Ambulatory Visit: Payer: Self-pay | Admitting: Internal Medicine

## 2016-03-11 DIAGNOSIS — E785 Hyperlipidemia, unspecified: Secondary | ICD-10-CM

## 2016-03-11 DIAGNOSIS — I10 Essential (primary) hypertension: Secondary | ICD-10-CM

## 2016-04-13 ENCOUNTER — Other Ambulatory Visit: Payer: Self-pay | Admitting: Internal Medicine

## 2016-04-13 ENCOUNTER — Other Ambulatory Visit: Payer: Self-pay | Admitting: *Deleted

## 2016-04-13 ENCOUNTER — Telehealth: Payer: Self-pay | Admitting: *Deleted

## 2016-04-13 DIAGNOSIS — B20 Human immunodeficiency virus [HIV] disease: Secondary | ICD-10-CM

## 2016-04-13 NOTE — Telephone Encounter (Signed)
atripla is fine. thanks

## 2016-04-13 NOTE — Telephone Encounter (Signed)
Refill request received for Atripla and patient was changed to [place] last month. I called her and she said she took the [facility] for two weeks and then went back to Atripla. She said her eating habits are not good and she can not always take Genvoya with food.

## 2016-06-08 ENCOUNTER — Other Ambulatory Visit: Payer: Self-pay | Admitting: Internal Medicine

## 2016-06-08 DIAGNOSIS — F5101 Primary insomnia: Secondary | ICD-10-CM

## 2016-06-09 ENCOUNTER — Other Ambulatory Visit: Payer: Self-pay | Admitting: *Deleted

## 2016-06-09 DIAGNOSIS — F5101 Primary insomnia: Secondary | ICD-10-CM

## 2016-06-09 MED ORDER — ZOLPIDEM TARTRATE ER 12.5 MG PO TBCR: 12.5000 mg | EXTENDED_RELEASE_TABLET | Freq: Every evening | ORAL | 1 refills | Status: DC | PRN

## 2016-06-17 ENCOUNTER — Ambulatory Visit: Payer: BLUE CROSS/BLUE SHIELD | Admitting: Internal Medicine

## 2016-07-23 ENCOUNTER — Encounter: Payer: Self-pay | Admitting: Infectious Diseases

## 2016-07-23 ENCOUNTER — Ambulatory Visit (INDEPENDENT_AMBULATORY_CARE_PROVIDER_SITE_OTHER): Payer: BLUE CROSS/BLUE SHIELD | Admitting: Infectious Diseases

## 2016-07-23 ENCOUNTER — Ambulatory Visit
Admission: RE | Admit: 2016-07-23 | Discharge: 2016-07-23 | Disposition: A | Discharge status: Home / Self Care | Payer: BLUE CROSS/BLUE SHIELD | Source: Ambulatory Visit | Attending: Infectious Diseases | Admitting: Infectious Diseases

## 2016-07-23 ENCOUNTER — Ambulatory Visit (INDEPENDENT_AMBULATORY_CARE_PROVIDER_SITE_OTHER): Payer: BLUE CROSS/BLUE SHIELD | Admitting: Licensed Clinical Social Worker

## 2016-07-23 VITALS — BP 110/76 | HR 90 | Temp 98.2°F | Ht 62.5 in | Wt 157.0 lb

## 2016-07-23 DIAGNOSIS — B20 Human immunodeficiency virus [HIV] disease: Secondary | ICD-10-CM | POA: Diagnosis not present

## 2016-07-23 DIAGNOSIS — R1013 Epigastric pain: Secondary | ICD-10-CM

## 2016-07-23 DIAGNOSIS — F4322 Adjustment disorder with anxiety: Secondary | ICD-10-CM

## 2016-07-23 DIAGNOSIS — J40 Bronchitis, not specified as acute or chronic: Secondary | ICD-10-CM | POA: Diagnosis not present

## 2016-07-23 DIAGNOSIS — R634 Abnormal weight loss: Secondary | ICD-10-CM | POA: Diagnosis not present

## 2016-07-23 DIAGNOSIS — R635 Abnormal weight gain: Secondary | ICD-10-CM | POA: Insufficient documentation

## 2016-07-23 LAB — CBC
HCT: 38.5 % (ref 35.0–45.0)
Hemoglobin: 13.2 g/dL (ref 11.7–15.5)
MCH: 31.4 pg (ref 27.0–33.0)
MCHC: 34.3 g/dL (ref 32.0–36.0)
MCV: 91.4 fL (ref 80.0–100.0)
MPV: 10.3 fL (ref 7.5–12.5)
Platelets: 268 10*3/uL (ref 140–400)
RBC: 4.21 MIL/uL (ref 3.80–5.10)
RDW: 14.1 % (ref 11.0–15.0)
WBC: 11.2 10*3/uL — AB (ref 3.8–10.8)

## 2016-07-23 NOTE — Patient Instructions (Signed)
Sent over a new referral to GI doctor with Dr. Carlean Purl to see you again.   Keep your current appointment with Dr.Comer in August.   We will call you if there is anything revealing on our labs.   Wonderful to meet you today.

## 2016-07-23 NOTE — Progress Notes (Signed)
Brief Narrative: Amy Hernandez is a patient of Dr. Henreitta Leber with well controlled HIV on Atripla.   PCP - Patient, No Pcp Per    Patient Active Problem List   Diagnosis Date Noted  . Weight loss, unintentional 07/23/2016  . Chest congestion 02/04/2016  . IBS (irritable bowel syndrome) 08/28/2014  . Insomnia 08/28/2014  . Screening examination for venereal disease 05/16/2013  . Encounter for long-term (current) use of medications 05/16/2013  . Bartholin gland cyst 07/19/2012  . History of hysterectomy   . Dyslipidemia, goal LDL below 160 02/07/2009  . ADJ DISORDER WITH MIXED ANXIETY & DEPRESSED MOOD 04/07/2007  . HYPERGLYCEMIA 06/04/2006  . Human immunodeficiency virus (HIV) disease ([place]) 12/05/2005  . Essential hypertension 12/05/2005    Patient's Medications  New Prescriptions   No medications on file  Previous Medications   ATRIPLA 600-200-300 MG TABLET    TAKE 1 TABLET BY MOUTH AT BEDTIME   LISINOPRIL-HYDROCHLOROTHIAZIDE (PRINZIDE,ZESTORETIC) 20-25 MG TABLET    TAKE 1 TABLET BY MOUTH DAILY   SIMVASTATIN (ZOCOR) 20 MG TABLET    TAKE 1 TABLET(20 MG) BY MOUTH DAILY   ZOLPIDEM (AMBIEN CR) 12.5 MG CR TABLET    Take 1 tablet (12.5 mg total) by mouth at bedtime as needed. for sleep  Modified Medications   No medications on file  Discontinued Medications   No medications on file    Subjective: Amy Hernandez presents to clinic today for acute visit related to approximately 30 lb weight loss over the last month.   HPI:  Weight Loss = lost about 30 lbs over the course of a month unintentionally. Symptoms she is experiencing currently include what is listed in ROS below. She has had no changes to her employment or living situation at home but reports that her children have given her a great deal of stress lately that started around the time of this onset. They are currently not speaking to one another and she feels caught in the middle, embarrassed and burdened by their ongoing issues.  Feels this may be making her abdominal pains worse.   Abdominal Pain = her abdominal pain is described to be in RUQ/Epigastric region. Reports BMs 1-2 xs a day. Occasional diarrhea but no constipation. Associated with fatigue, weakness, and no appetite. Denies fevers, chills or blood in stool. Sometimes foods "run right through her." Abdominal cramps more frequent now over the last 6 months. She experiences these cramping episodes 2-4 times a week now. Previously was rx'd hyoscyamine for IBS by Dr. Carlean Purl and reported this helped her greatly. Uncertain as to what her triggers are for pain but she has to force herself to eat most of the time. Drinks Pepsi and other sodas frequently.   HIV =  Currently on a regimen of Atripla and based on recent labs she has been well controlled recently. She is concerned her HIV is contributing to her overall weight loss. She has not missed any doses of her medications and takes it consistently.   Health Maintenance/Screening = She is s/p hysterectomy. She is uncertain if she still has a cervix or her ovaries. Only + cancer family history includes bladder cancer in her father. No breast, colon or ovarian cancer. She had a colonoscopy in 2009 for hematochezia that revealed internal medium sized hemorrhoids but nothing that was suspicious.   Review of Systems: Review of Systems  Constitutional: Positive for malaise/fatigue and weight loss. Negative for chills and fever.  HENT: Negative for sore throat.  Respiratory: Negative for cough, hemoptysis, sputum production and shortness of breath.   Cardiovascular: Negative for chest pain, palpitations and leg swelling.  Gastrointestinal: Positive for abdominal pain, diarrhea and vomiting. Negative for blood in stool, constipation and heartburn.  Genitourinary: Negative for dysuria, frequency and urgency.  Musculoskeletal: Negative for joint pain and myalgias.  Skin: Negative for rash.  Neurological: Positive for dizziness  and weakness. Negative for headaches.  Endo/Heme/Allergies: Negative for polydipsia.  Psychiatric/Behavioral: Negative for depression, substance abuse and suicidal ideas. The patient is nervous/anxious.     Past Medical History:  Diagnosis Date  . Bartholin gland cyst   . HIV (human immunodeficiency virus infection) ([place])   . Hyperlipidemia   . Hypertension   . IBS (irritable bowel syndrome)   . Insomnia   . Substance abuse     Social History  Substance Use Topics  . Smoking status: Current Every Day Smoker    Packs/day: 0.25    Years: 30.00    Types: Cigarettes  . Smokeless tobacco: Never Used     Comment:  pt. trying to quit  . Alcohol use No    Family History  Problem Relation Age of Onset  . Diabetes Mother   . Hyperlipidemia Mother   . Hypertension Mother   . Cancer Father        bladder cancer   . Heart disease Father   . Hypertension Father   . Diabetes Maternal Grandmother   . Hyperlipidemia Maternal Grandmother     Allergies  Allergen Reactions  . Codeine     REACTION: hallucinations    Objective:  Vitals:   07/23/16 1454  BP: 110/76  Pulse: 90  Temp: 98.2 F (36.8 C)  TempSrc: Oral  Weight: 157 lb (71.2 kg)  Height: 5' 2.5" (1.588 m)   Body mass index is 28.26 kg/m.  Physical Exam  Constitutional: She is oriented to person, place, and time and well-developed, well-nourished, and in no distress.  Fatigued appearing   HENT:  Mouth/Throat: Oropharynx is clear and moist. No oral lesions. No dental abscesses. No oropharyngeal exudate.  Eyes: Pupils are equal, round, and reactive to light.  Neck: Neck supple. No thyromegaly present.  Cardiovascular: Normal rate, regular rhythm and normal heart sounds.   No murmur heard. Pulmonary/Chest: Effort normal and breath sounds normal.  Abdominal: Soft. Bowel sounds are normal. She exhibits no distension, no ascites and no mass. There is no hepatosplenomegaly. There is tenderness in the right upper  quadrant and epigastric area. There is no CVA tenderness.  Musculoskeletal: Normal range of motion. She exhibits no tenderness.  Lymphadenopathy:    She has no cervical adenopathy.  Neurological: She is alert and oriented to person, place, and time.  Skin: Skin is warm and dry. No rash noted.  Psychiatric: Mood, affect and judgment normal.  Occasionally tearful talking about her family situation and appears stressed.     Lab Results Lab Results  Component Value Date   WBC 9.1 06/13/2015   HGB 13.3 06/13/2015   HCT 39.2 06/13/2015   MCV 91.4 06/13/2015   PLT 270 06/13/2015    Lab Results  Component Value Date   CREATININE 0.86 06/13/2015   BUN 12 06/13/2015   NA 137 06/13/2015   K 3.3 (L) 06/13/2015   CL 102 06/13/2015   CO2 25 06/13/2015    Lab Results  Component Value Date   ALT 10 06/13/2015   AST 14 06/13/2015   ALKPHOS 155 (H) 06/13/2015   BILITOT 0.2  06/13/2015    Lab Results  Component Value Date   CHOL 204 (H) 06/13/2015   HDL 55 06/13/2015   LDLCALC 111 06/13/2015   TRIG 189 (H) 06/13/2015   CHOLHDL 3.7 06/13/2015   HIV 1 RNA Quant (copies/mL)  Date Value  02/04/2016 <20  06/13/2015 <20  08/28/2014 <20   CD4 T Cell Abs (/uL)  Date Value  02/04/2016 1,250  06/13/2015 1,110  08/28/2014 980   No results found for: HAV Lab Results  Component Value Date   HEPBSAG NO 03/22/2006   HEPBSAB YES 03/22/2006   Lab Results  Component Value Date   HCVAB NO 03/22/2006   Lab Results  Component Value Date   CHLAMYDIAWP Negative 06/13/2015   N Negative 06/13/2015   Problem List Items Addressed This Visit      Other   Weight loss, unintentional - Primary    Exam and history unrevealing for any organic cause at this time. I wonder how much her current stress is contributing to poor appetite and exacerbating her IBS. With new onset vomiting and continued abdominal pain will refer back to Dr. Carlean Purl to see if further evaluation needed. Will check labs to  rule out hepatitis or pancreatic related causes or metabolic diseases. CXR with smoking history. She is s/p gallbladder removal. No TB assay on file will check quantiferon today with weight loss. I had her meet Judeen Hammans today and she will work with her regarding tactics and strategies for stress management. Will await lab results and follow up with Dr. Linus Salmons as scheduled for now.       Relevant Orders   Comprehensive metabolic panel   CBC   Hemoglobin A1c   TSH   T4, free   DG Chest 2 View   Sed Rate (ESR)   Ambulatory referral to Gastroenterology   Urinalysis   Hepatitis panel, acute   Quantiferon tb gold assay (blood)   Lipase   Amylase   Iron and TIBC   Ferritin   Human immunodeficiency virus (HIV) disease (HCC)    No changes at this time. Will check VL today just to ensure she is still suppressed.        Other Visit Diagnoses    Epigastric pain       Relevant Orders   Comprehensive metabolic panel   CBC   Hemoglobin A1c   TSH   T4, free   DG Chest 2 View   Sed Rate (ESR)   Ambulatory referral to Gastroenterology   Urinalysis   Hepatitis panel, acute   Quantiferon tb gold assay (blood)   Lipase   Amylase   Iron and TIBC   Ferritin   HIV (human immunodeficiency virus infection) ([place])       Relevant Orders   HIV 1 RNA quant-no reflex-bld   T-helper cell (CD4)- ([facility] clinic only)      Health maintenance/Screening = Recommend she go to appt with Dr. Carlean Purl and discuss utility of colonoscopy/endoscopy with current symptoms vs waiting for routine screening. She is uncertain if she has cervix or uterus - would recommend discussion of symptoms with GYN should testing we find today unrevealing.   Smoking cessation = encouraged to quit smoking.  [name], MSN, NP-C [facility] Cell: [phone] Pager: [phone]  07/23/16 5:02 PM

## 2016-07-23 NOTE — Assessment & Plan Note (Signed)
No changes at this time. Will check VL today just to ensure she is still suppressed.

## 2016-07-23 NOTE — Assessment & Plan Note (Addendum)
Exam and history unrevealing for any organic cause at this time. I wonder how much her current stress is contributing to poor appetite and exacerbating her IBS. With new onset vomiting and continued abdominal pain will refer back to Dr. Carlean Purl to see if further evaluation needed. Will check labs to rule out hepatitis or pancreatic related causes or metabolic diseases. CXR with smoking history. She is s/p gallbladder removal. No TB assay on file will check quantiferon today with weight loss. I had her meet Judeen Hammans today and she will work with her regarding tactics and strategies for stress management. Will await lab results and follow up with Dr. Linus Salmons as scheduled for now.

## 2016-07-24 ENCOUNTER — Telehealth: Payer: Self-pay | Admitting: *Deleted

## 2016-07-24 LAB — T-HELPER CELL (CD4) - (RCID CLINIC ONLY)
CD4 % Helper T Cell: 29 % — ABNORMAL LOW (ref 33–55)
CD4 T Cell Abs: 1340 /uL (ref 400–2700)

## 2016-07-24 LAB — URINALYSIS
BILIRUBIN URINE: NEGATIVE
Glucose, UA: NEGATIVE
Hgb urine dipstick: NEGATIVE
KETONES UR: NEGATIVE
Nitrite: NEGATIVE
PH: 6.5 (ref 5.0–8.0)
Protein, ur: NEGATIVE
SPECIFIC GRAVITY, URINE: 1.024 (ref 1.001–1.035)

## 2016-07-24 LAB — COMPREHENSIVE METABOLIC PANEL
ALBUMIN: 4.5 g/dL (ref 3.6–5.1)
ALK PHOS: 146 U/L — AB (ref 33–115)
ALT: 7 U/L (ref 6–29)
AST: 14 U/L (ref 10–35)
BILIRUBIN TOTAL: 0.2 mg/dL (ref 0.2–1.2)
BUN: 13 mg/dL (ref 7–25)
CALCIUM: 9.6 mg/dL (ref 8.6–10.2)
CO2: 25 mmol/L (ref 20–31)
Chloride: 103 mmol/L (ref 98–110)
Creat: 0.86 mg/dL (ref 0.50–1.10)
Glucose, Bld: 97 mg/dL (ref 65–99)
Potassium: 3.3 mmol/L — ABNORMAL LOW (ref 3.5–5.3)
Sodium: 138 mmol/L (ref 135–146)
Total Protein: 7.5 g/dL (ref 6.1–8.1)

## 2016-07-24 LAB — HEPATITIS PANEL, ACUTE
HCV Ab: NEGATIVE
HEP B C IGM: NONREACTIVE
Hep A IgM: NONREACTIVE
Hepatitis B Surface Ag: NEGATIVE

## 2016-07-24 LAB — AMYLASE: AMYLASE: 44 U/L (ref 21–101)

## 2016-07-24 LAB — HEMOGLOBIN A1C
Hgb A1c MFr Bld: 5.3 % (ref <5.7)
MEAN PLASMA GLUCOSE: 105 mg/dL

## 2016-07-24 LAB — LIPASE: Lipase: 27 U/L (ref 7–60)

## 2016-07-24 LAB — IRON AND TIBC
%SAT: 23 % (ref 11–50)
IRON: 71 ug/dL (ref 40–190)
TIBC: 312 ug/dL (ref 250–450)
UIBC: 241 ug/dL

## 2016-07-24 LAB — T4, FREE: FREE T4: 1.1 ng/dL (ref 0.8–1.8)

## 2016-07-24 LAB — TSH: TSH: 0.5 m[IU]/L

## 2016-07-24 LAB — SEDIMENTATION RATE: Sed Rate: 22 mm/hr — ABNORMAL HIGH (ref 0–20)

## 2016-07-24 LAB — FERRITIN: Ferritin: 66 ng/mL (ref 10–232)

## 2016-07-24 MED ORDER — HYOSCYAMINE SULFATE ER 0.375 MG PO TB12: 0.3750 mg | ORAL_TABLET | Freq: Two times a day (BID) | ORAL | 0 refills | Status: DC

## 2016-07-24 NOTE — Addendum Note (Signed)
Addended by: [place] Callas on: 07/24/2016 12:38 PM   Modules accepted: Orders

## 2016-07-24 NOTE — Telephone Encounter (Signed)
So far we have ruled out a few things on her labs - no diabetes, thyroid disease, hepatitis, pancreatitis, iron deficiency. Chest x ray looks good. She did have a very mild elevation of her wbc count and inflammatory marker which could be IBS related.   I sent in a 1 month fill for the medication Dr. Carlean Purl prescribed her that she said helped her so much --Levbid 0.375 mcg tab BID--until she can get back in to see Dr. Carlean Purl with GI (referrral already made yesterday in Epic).   We do still have some lab work pending but so far no answers to explain anything at this point. Hopefully this will help her symptoms.   Thank you

## 2016-07-24 NOTE — Telephone Encounter (Signed)
Notified the patient that Levbid was available for pick-up at [facility] per S. Dixon.

## 2016-07-24 NOTE — Progress Notes (Signed)
Integrated Behavioral Health Initial Visit  MRN: [id number] Name: Amy Hernandez   Session Start time: 4:10 pm Session End time: 4:28 pm Total time: 20 minutes  Type of Service: [place] Interpretor:No. Interpretor Name and Language: N/A   Warm Hand Off Completed.       SUBJECTIVE: Amy Hernandez is a 51 y.o. female accompanied by patient. Patient is a patient of Dr. Linus Salmons and was referred by [name] for difficulty managing stress.  Patient reported the following symptoms/concerns: Patient reported having an "anxious" and "exhausted" mood both mentally and physically.  Patient denied experience of anhedonia, sleep disturbance (sleeping 8-10 hours a night), social isolation, feeling worthless, distractibility, and denied general or specific thoughts of death.  Patient reported that she has IBS which may contribute to her not consuming major meals but instead nibbling on food with decreased appetite.    Patient reported that she is experiencing life stressors, one of which includes conflict between her adult children (daughter 34 and son 31) and work-related stress.  Patient stated that she is hopeful that her stress symptoms can get better.  Patient reported that she has a good social support network which includes her mother, boyfriend, grandmother, and friends.    Severity of problem: mild  OBJECTIVE: Mood: Anxious and Affect: within range Risk of harm to self or others: No plan to harm self or others   GOALS ADDRESSED: Patient will reduce symptoms of: anxiety and stress and increase knowledge and/or ability of: coping skills, healthy habits and stress reduction and also: Increase healthy adjustment to current life circumstances   INTERVENTIONS: Motivational Interviewing and Supportive Counseling   ASSESSMENT: Patient currently experiencing difficulty managing stress and may benefit from stress reduction and relaxation  techniques.  PLAN: 1. Follow up with behavioral health clinician on : Thursday July 12th at 3 pm.   Sande Rives, [facility]

## 2016-07-24 NOTE — Telephone Encounter (Signed)
Patient reporting symptoms are unchanged and slightly worse.  She wanted S. Dixon, NP to know her status today.  Wondering about the results from lab work yesterday.

## 2016-07-25 LAB — QUANTIFERON TB GOLD ASSAY (BLOOD)
Interferon Gamma Release Assay: NEGATIVE
Quantiferon Nil Value: 0.08 IU/mL

## 2016-07-27 ENCOUNTER — Encounter: Payer: Self-pay | Admitting: Infectious Diseases

## 2016-07-30 ENCOUNTER — Encounter (HOSPITAL_BASED_OUTPATIENT_CLINIC_OR_DEPARTMENT_OTHER): Payer: Self-pay | Admitting: Emergency Medicine

## 2016-07-30 ENCOUNTER — Emergency Department (HOSPITAL_BASED_OUTPATIENT_CLINIC_OR_DEPARTMENT_OTHER): Payer: BLUE CROSS/BLUE SHIELD

## 2016-07-30 ENCOUNTER — Emergency Department (HOSPITAL_BASED_OUTPATIENT_CLINIC_OR_DEPARTMENT_OTHER)
Admission: EM | Admit: 2016-07-30 | Discharge: 2016-07-30 | Disposition: A | Discharge status: Home / Self Care | Payer: BLUE CROSS/BLUE SHIELD | Attending: Emergency Medicine | Admitting: Emergency Medicine

## 2016-07-30 DIAGNOSIS — R630 Anorexia: Secondary | ICD-10-CM | POA: Insufficient documentation

## 2016-07-30 DIAGNOSIS — I10 Essential (primary) hypertension: Secondary | ICD-10-CM | POA: Diagnosis not present

## 2016-07-30 DIAGNOSIS — Z8719 Personal history of other diseases of the digestive system: Secondary | ICD-10-CM

## 2016-07-30 DIAGNOSIS — F17210 Nicotine dependence, cigarettes, uncomplicated: Secondary | ICD-10-CM | POA: Insufficient documentation

## 2016-07-30 DIAGNOSIS — R110 Nausea: Secondary | ICD-10-CM | POA: Diagnosis not present

## 2016-07-30 DIAGNOSIS — K589 Irritable bowel syndrome without diarrhea: Secondary | ICD-10-CM | POA: Insufficient documentation

## 2016-07-30 DIAGNOSIS — R634 Abnormal weight loss: Secondary | ICD-10-CM

## 2016-07-30 DIAGNOSIS — R112 Nausea with vomiting, unspecified: Secondary | ICD-10-CM | POA: Insufficient documentation

## 2016-07-30 MED ORDER — ACETAMINOPHEN 325 MG PO TABS
650.0000 mg | ORAL_TABLET | Freq: Once | ORAL | Status: AC
  Administered 2016-07-30: 650 mg via ORAL
  Filled 2016-07-30: qty 2

## 2016-07-30 MED ORDER — ONDANSETRON 8 MG PO TBDP: 8.0000 mg | ORAL_TABLET | Freq: Three times a day (TID) | ORAL | 0 refills | Status: DC | PRN

## 2016-07-30 NOTE — ED Triage Notes (Signed)
Ongoing nausea x 1 month with 30lb weight loss, hx of IBS. Pt states unable to eat due to nausea. Pt states she saw PCP last week and was referred to GI specialist who can't see her until august.

## 2016-07-30 NOTE — ED Notes (Signed)
Patient stated that she never tried to loss any weight.  Every time she ate, she felt nauseous and goes to the bathroom to have a loss bowel movement once or twice a day.   She claimed that she had loss 30 lbs in a month.

## 2016-07-30 NOTE — Discharge Instructions (Signed)
We will email Dr. Carlean Purl to see if they can get you in sooner. We are sorry that we are unable to give you a proper diagnosis - we think GI doctor will be able to help you the best.  Please return to the ER if your symptoms worsen; you have increased pain, fevers, chills, inability to keep any medications down, confusion. Otherwise see the outpatient doctor as requested.

## 2016-07-30 NOTE — ED Notes (Signed)
Pt states she drank water and kept it down.

## 2016-07-30 NOTE — ED Provider Notes (Signed)
[facility] DEPT [facility] Provider Note   CSN: [id number] Arrival date & time: 07/30/16  1642     History   Chief Complaint Chief Complaint  Patient presents with  . Nausea    HPI Amy Hernandez is a 51 y.o. female.  HPI Pt comes in with cc of abd discomfort and weight loss. She has hx of HIV and IBS. Pt reports that over the past month she has lost 30 lbs and she has had early satiety and nausea. Pt saw her ID doctor, and she was started on levbid for her IBS - and the symptoms are slightly better, but she continues to have weight loss. Pt has generalized abd pain in the upper quadrants.     Past Medical History:  Diagnosis Date  . Bartholin gland cyst   . Cervical intraepithelial neoplasia (CIN) 2005   high grade cervical dysplasia prior to total hysterectomy - needs yearly vaginal paps   . HIV (human immunodeficiency virus infection) ([place])   . Hyperlipidemia   . Hypertension   . IBS (irritable bowel syndrome)   . Insomnia   . Substance abuse     Patient Active Problem List   Diagnosis Date Noted  . Weight loss, unintentional 07/23/2016  . Chest congestion 02/04/2016  . IBS (irritable bowel syndrome) 08/28/2014  . Insomnia 08/28/2014  . Screening examination for venereal disease 05/16/2013  . Encounter for long-term (current) use of medications 05/16/2013  . Bartholin gland cyst 07/19/2012  . History of hysterectomy   . Dyslipidemia, goal LDL below 160 02/07/2009  . ADJ DISORDER WITH MIXED ANXIETY & DEPRESSED MOOD 04/07/2007  . HYPERGLYCEMIA 06/04/2006  . Human immunodeficiency virus (HIV) disease ([place]) 12/05/2005  . Essential hypertension 12/05/2005    Past Surgical History:  Procedure Laterality Date  . ABDOMINAL HYSTERECTOMY  09/2003  . ANAL SPHINCTEROTOMY    . CHOLECYSTECTOMY  1990  . COLONOSCOPY  2009   NL  . LEEP  2000   history of CIN   . TUBAL LIGATION      OB History    No data available       Home Medications    Prior to Admission  medications   Medication Sig Start Date End Date Taking? Authorizing Provider  ATRIPLA 600-200-300 MG tablet TAKE 1 TABLET BY MOUTH AT BEDTIME 04/13/16   Comer, Okey Regal, MD  hyoscyamine (LEVBID) 0.375 MG 12 hr tablet Take 1 tablet (0.375 mg total) by mouth 2 (two) times daily. 07/24/16   [place] Callas, NP  lisinopril-hydrochlorothiazide (PRINZIDE,ZESTORETIC) 20-25 MG tablet TAKE 1 TABLET BY MOUTH DAILY 03/12/16   Comer, Okey Regal, MD  ondansetron (ZOFRAN ODT) 8 MG disintegrating tablet Take 1 tablet (8 mg total) by mouth every 8 (eight) hours as needed for nausea. 07/30/16   Varney Biles, MD  simvastatin (ZOCOR) 20 MG tablet TAKE 1 TABLET(20 MG) BY MOUTH DAILY 03/12/16   Comer, Okey Regal, MD  zolpidem (AMBIEN CR) 12.5 MG CR tablet Take 1 tablet (12.5 mg total) by mouth at bedtime as needed. for sleep 06/09/16   Thayer Headings, MD    Family History Family History  Problem Relation Age of Onset  . Diabetes Mother   . Hyperlipidemia Mother   . Hypertension Mother   . Cancer Father        bladder cancer   . Heart disease Father   . Hypertension Father   . Diabetes Maternal Grandmother   . Hyperlipidemia Maternal Grandmother     Social History Social  History  Substance Use Topics  . Smoking status: Current Every Day Smoker    Packs/day: 0.25    Years: 30.00    Types: Cigarettes  . Smokeless tobacco: Never Used     Comment:  pt. trying to quit  . Alcohol use No     Allergies   Codeine   Review of Systems Review of Systems   Physical Exam Updated Vital Signs BP 123/76 (BP Location: Right Arm)   Pulse 79   Temp 98.1 F (36.7 C) (Oral)   Resp 18   Wt 70.3 kg (155 lb)   SpO2 99%   BMI 27.90 kg/m   Physical Exam   ED Treatments / Results  Labs (all labs ordered are listed, but only abnormal results are displayed) Labs Reviewed - No data to display  EKG  EKG Interpretation None       Radiology US Abdomen Complete  Result Date: 07/30/2016 CLINICAL DATA:   Nausea.  Weight loss. EXAM: ABDOMEN ULTRASOUND COMPLETE COMPARISON:  None. FINDINGS: Gallbladder: Surgically absent Common bile duct: Diameter: 2.7 mm Liver: No focal lesion identified. Within normal limits in parenchymal echogenicity. IVC: No abnormality visualized. Pancreas: Visualized portion unremarkable. Spleen: Size and appearance within normal limits. Right Kidney: Length: 10.6 cm. Echogenicity within normal limits. No mass or hydronephrosis visualized. Left Kidney: Length: 10.6 cm. Echogenicity within normal limits. No mass or hydronephrosis visualized. Abdominal aorta: No aneurysm visualized. Other findings: None. IMPRESSION: The patient is status post cholecystectomy. No acute abnormalities identified. Electronically Signed   By: Dorise Bullion III M.D   On: 07/30/2016 18:16    Procedures Procedures (including critical care time)  Medications Ordered in ED Medications  acetaminophen (TYLENOL) tablet 650 mg (650 mg Oral Given 07/30/16 1718)     Initial Impression / Assessment and Plan / ED Course  I have reviewed the triage vital signs and the nursing notes.  Pertinent labs & imaging results that were available during my care of the patient were reviewed by me and considered in my medical decision making (see chart for details).  Clinical Course as of Jul 30 2344  Thu Jul 30, 2016  1927 Results from the ER workup discussed with the patient face to face and all questions answered to the best of my ability.   [AN]    Clinical Course User Index [AN] Varney Biles, MD    Pt comes in with generalized abd pain, nausea, weight loss ,early satiety Pt has hx of HIV, IBS. She has had significant weight loss over the past month. PCP saw her, ordered basic labs last week and they were normal. Pt comes in to the ER in desperation and with frustration. GI f/u is in 5 weeks.  Pt has upper quad abd tenderness. [place] ordered - no splenomegaly seen. She has HIV hx -so lymphomas would be in the ddx.  I dont see an indication for emergent CT. I emailed Dr. Carlean Purl, hoping that they would put patient up for an appointment sooner than the one scheduled.  Results from the ER workup discussed with the patient face to face and all questions answered to the best of my ability.    Final Clinical Impressions(s) / ED Diagnoses   Final diagnoses:  Unintentional weight loss  History of IBS  Nausea without vomiting  Poor appetite    New Prescriptions Discharge Medication List as of 07/30/2016  7:58 PM    START taking these medications   Details  ondansetron (ZOFRAN ODT) 8 MG disintegrating  tablet Take 1 tablet (8 mg total) by mouth every 8 (eight) hours as needed for nausea., Starting Thu 07/30/2016, Print         Varney Biles, MD 07/30/16 [phone]

## 2016-07-30 NOTE — ED Notes (Signed)
ED Provider at bedside. 

## 2016-08-03 ENCOUNTER — Telehealth: Payer: Self-pay | Admitting: *Deleted

## 2016-08-03 ENCOUNTER — Encounter: Payer: Self-pay | Admitting: Internal Medicine

## 2016-08-03 ENCOUNTER — Ambulatory Visit (INDEPENDENT_AMBULATORY_CARE_PROVIDER_SITE_OTHER): Payer: BLUE CROSS/BLUE SHIELD | Admitting: Internal Medicine

## 2016-08-03 VITALS — BP 110/70 | HR 82 | Ht 62.5 in | Wt 156.0 lb

## 2016-08-03 DIAGNOSIS — R1084 Generalized abdominal pain: Secondary | ICD-10-CM

## 2016-08-03 DIAGNOSIS — R10817 Generalized abdominal tenderness: Secondary | ICD-10-CM

## 2016-08-03 DIAGNOSIS — R634 Abnormal weight loss: Secondary | ICD-10-CM | POA: Diagnosis not present

## 2016-08-03 MED ORDER — ONDANSETRON 8 MG PO TBDP: 8.0000 mg | ORAL_TABLET | Freq: Three times a day (TID) | ORAL | 0 refills | Status: DC | PRN

## 2016-08-03 NOTE — Telephone Encounter (Signed)
Patient called to let NP, Colletta Maryland know that she was able to get in with the GI MD today. She still is not feeling good and her appt was going to be in August; so she is glad that they called her back and are able to see her sooner. She wanted to thank the clinical staff and especially her NP because she thinks that, "she is amazing".

## 2016-08-03 NOTE — Patient Instructions (Addendum)
  We have sent the following medications to your pharmacy for you to pick up at your convenience: Ondansetron   You have been scheduled for a CT scan of the abdomen and pelvis at [facility] ([address]---this is in the same building as Press [facility]).   You are scheduled on 08/04/16 at 2:45pm. You should arrive 15 minutes prior to your appointment time for registration. Please follow the written instructions below on the day of your exam:  WARNING: IF YOU ARE ALLERGIC TO IODINE/X-RAY DYE, PLEASE NOTIFY RADIOLOGY IMMEDIATELY AT [phone]! YOU WILL BE GIVEN A 13 HOUR PREMEDICATION PREP.  1) Do not eat or drink anything after 10:45AM (4 hours prior to your test) 2) You have been given 2 bottles of oral contrast to drink. The solution may taste  better if refrigerated, but do NOT add ice or any other liquid to this solution. Shake   well before drinking.    Drink 1 bottle of contrast @ 12:45PM (2 hours prior to your exam)  Drink 1 bottle of contrast @ 1:45PM (1 hour prior to your exam)  You may take any medications as prescribed with a small amount of water except for the following: Metformin, Glucophage, Glucovance, Avandamet, Riomet, Fortamet, Actoplus Met, Janumet, Glumetza or Metaglip. The above medications must be held the day of the exam AND 48 hours after the exam.  The purpose of you drinking the oral contrast is to aid in the visualization of your intestinal tract. The contrast solution may cause some diarrhea. Before your exam is started, you will be given a small amount of fluid to drink. Depending on your individual set of symptoms, you may also receive an intravenous injection of x-ray contrast/dye. Plan on being at [facility] for 30 minutes or longer, depending on the type of exam you are having performed.  This test typically takes 30-45 minutes to complete.  If you have any questions regarding your exam or if you need to reschedule, you may call the  CT department at [phone] between the hours of 8:00 am and 5:00 pm, Monday-Friday.  ________________________________________________________________________  I appreciate the opportunity to care for you. Silvano Rusk, MD, [facility]

## 2016-08-03 NOTE — Progress Notes (Signed)
Amy Hernandez 51 y.o. 07-Jun-1965 [id number]  Assessment & Plan:   Encounter Diagnoses  Name Primary?  . Loss of weight Yes  . Generalized abdominal pain   . Generalized abdominal tenderness without rebound tenderness    1. CT abd/pelvis with contrast 08/04/16 to evaluate for cause 2. Ondansetron Rx Refill  Discussed need for CT and follow up with either Upper Endoscopy and/or Colonoscopy depending on results of CT.  Will call with results and follow up plan.  Would not think problems related to HIV as on Tx with good CD4 counts. ? Stress related - other causes need to be ruled out - this story does raise ? Of malignancy vs an inflammatory process.  I have personally seen the patient, reviewed and repeated key elements of the history and physical and participated in formation of the assessment and plan the student has documented. Gatha Mayer, MD, [facility]    Subjective:   Chief Complaint:  1.Unexplained weight-loss 2. Increased Fatigue  HPI Amy Hernandez is a 51 yo female with past medical history of HIV, hypertension and hyperlipidemia- all well controlled.  She is compliant on her medications.  She presents to the office with new onset unintentional weight loss with early satiety and increased fatigue.  She was seen in the ED on 7/5 with the same symptoms and referred here for outpatient management.  She has lost close to 30 lbs since May and has had increased bloating, and fatigue x2 weeks.  She has minimal appetite and feels full upon eating small amount and then has diarrhea within 10 mins of eating with vague persistent epigastric pain.  She also reports intermittent vomiting when brushing her teeth.  She has an increased stress within her family, but is unsure if family circumstances are contributing to her symptoms.  She denies, sleep loss or sleep increased, hot flashes, fevers, or chills.   She has started drinking ensure and that seems to be helping her stools to form better.   Ondansetron is helpful with nausea.   Allergies  Allergen Reactions  . Codeine     REACTION: hallucinations   Current Meds  Medication Sig  . ATRIPLA 600-200-300 MG tablet TAKE 1 TABLET BY MOUTH AT BEDTIME  . hyoscyamine (LEVBID) 0.375 MG 12 hr tablet Take 1 tablet (0.375 mg total) by mouth 2 (two) times daily.  Marland Kitchen lisinopril-hydrochlorothiazide (PRINZIDE,ZESTORETIC) 20-25 MG tablet TAKE 1 TABLET BY MOUTH DAILY  . ondansetron (ZOFRAN ODT) 8 MG disintegrating tablet Take 1 tablet (8 mg total) by mouth every 8 (eight) hours as needed for nausea.  . simvastatin (ZOCOR) 20 MG tablet TAKE 1 TABLET(20 MG) BY MOUTH DAILY  . zolpidem (AMBIEN CR) 12.5 MG CR tablet Take 1 tablet (12.5 mg total) by mouth at bedtime as needed. for sleep   Past Medical History:  Diagnosis Date  . Bartholin gland cyst   . Cervical intraepithelial neoplasia (CIN) 2005   high grade cervical dysplasia prior to total hysterectomy - needs yearly vaginal paps   . HIV (human immunodeficiency virus infection) ([place])   . Hyperlipidemia   . Hypertension   . IBS (irritable bowel syndrome)   . Insomnia   . Substance abuse    Past Surgical History:  Procedure Laterality Date  . ABDOMINAL HYSTERECTOMY  09/2003  . ANAL SPHINCTEROTOMY    . CHOLECYSTECTOMY  1990  . COLONOSCOPY  2009   NL  . LEEP  2000   history of CIN   . TUBAL LIGATION  SHx reviewed in EMR family history includes Cancer in her father; Diabetes in her maternal grandmother and mother; Heart disease in her father; Hyperlipidemia in her maternal grandmother and mother; Hypertension in her father and mother.   Review of Systems- See HPI   Objective:   Physical Exam @BP  110/70   Pulse 82   Ht 5' 2.5" (1.588 m)   Wt 156 lb (70.8 kg)   BMI 28.08 kg/m @  General:  Well-developed, well-nourished and in no acute distress Eyes:  anicteric.Marland Kitchen  Neck:   supple w/o thyromegaly or mass.  Lungs: Clear to auscultation bilaterally. Heart:   S1S2, no rubs,  murmurs, gallops. Abdomen: soft, diffuse mild tenderness, no hepatosplenomegaly, hernia, or mass and BS+. No ascites Lymph:  no cervical, supraclavicular, axillary adenopathy. Extremities:   no edema, cyanosis or clubbing Skin   no rash or jaundice Neuro:  A&O x 3.  Psych:  appropriate mood and  Affect.   Data Reviewed: CBC    Component Value Date/Time   WBC 11.2 (H) 07/23/2016 1643   RBC 4.21 07/23/2016 1643   HGB 13.2 07/23/2016 1643   HCT 38.5 07/23/2016 1643   PLT 268 07/23/2016 1643   MCV 91.4 07/23/2016 1643   MCV 91.5 08/24/2014 1559   MCH 31.4 07/23/2016 1643   MCHC 34.3 07/23/2016 1643   RDW 14.1 07/23/2016 1643   LYMPHSABS 3,367 06/13/2015 1222   MONOABS 455 06/13/2015 1222   EOSABS 91 06/13/2015 1222   BASOSABS 0 06/13/2015 1222   Lab Results  Component Value Date   CD4TABS 1,340 07/23/2016   CD4TABS 1,250 02/04/2016   CD4TABS 1,110 06/13/2015   Edward Qualia,  PA-S [facility]

## 2016-08-04 ENCOUNTER — Ambulatory Visit (INDEPENDENT_AMBULATORY_CARE_PROVIDER_SITE_OTHER)
Admission: RE | Admit: 2016-08-04 | Discharge: 2016-08-04 | Disposition: A | Discharge status: Home / Self Care | Payer: BLUE CROSS/BLUE SHIELD | Source: Ambulatory Visit | Attending: Internal Medicine | Admitting: Internal Medicine

## 2016-08-04 DIAGNOSIS — R10817 Generalized abdominal tenderness: Secondary | ICD-10-CM

## 2016-08-04 DIAGNOSIS — R197 Diarrhea, unspecified: Secondary | ICD-10-CM | POA: Diagnosis not present

## 2016-08-04 DIAGNOSIS — R634 Abnormal weight loss: Secondary | ICD-10-CM | POA: Diagnosis not present

## 2016-08-04 DIAGNOSIS — R1084 Generalized abdominal pain: Secondary | ICD-10-CM

## 2016-08-04 MED ORDER — IOPAMIDOL (ISOVUE-300) INJECTION 61%
100.0000 mL | Freq: Once | INTRAVENOUS | Status: AC | PRN
  Administered 2016-08-04: 100 mL via INTRAVENOUS

## 2016-08-06 ENCOUNTER — Telehealth: Payer: Self-pay | Admitting: Licensed Clinical Social Worker

## 2016-08-06 ENCOUNTER — Ambulatory Visit: Payer: BLUE CROSS/BLUE SHIELD | Admitting: Licensed Clinical Social Worker

## 2016-08-06 NOTE — Telephone Encounter (Signed)
Spoke with patient about missed appointment and she stated that she is currently at work and could not talk.  She stated that she would call back to arrange for an appointment.  Sande Rives, [facility]

## 2016-08-06 NOTE — Progress Notes (Signed)
Good news here - no tumors, bad problems seen that would explain her sxs Needs to set up EGD - dx: Weight loss, nausea  Ok to use a 0730 if there is one somewhere

## 2016-08-17 ENCOUNTER — Ambulatory Visit (AMBULATORY_SURGERY_CENTER): Payer: Self-pay | Admitting: *Deleted

## 2016-08-17 VITALS — Ht 62.0 in | Wt 155.0 lb

## 2016-08-17 DIAGNOSIS — R634 Abnormal weight loss: Secondary | ICD-10-CM

## 2016-08-17 NOTE — Progress Notes (Signed)
Pt's states no medical or surgical changes since previsit or office visit. Patient denies any allergies to eggs or soy. Patient denies any problems with anesthesia/sedation. Patient denies any oxygen use at home and does not take any diet/weight loss medications. EMMI education assisgned to patient on EGD, this was explained and instructions given to patient.

## 2016-08-25 NOTE — Telephone Encounter (Signed)
?   No refill requested?

## 2016-08-26 ENCOUNTER — Encounter: Payer: Self-pay | Admitting: Internal Medicine

## 2016-08-26 ENCOUNTER — Ambulatory Visit (AMBULATORY_SURGERY_CENTER): Payer: BLUE CROSS/BLUE SHIELD | Admitting: Internal Medicine

## 2016-08-26 VITALS — BP 96/60 | HR 76 | Temp 97.7°F | Resp 14 | Ht 62.0 in | Wt 155.0 lb

## 2016-08-26 DIAGNOSIS — K2990 Gastroduodenitis, unspecified, without bleeding: Secondary | ICD-10-CM

## 2016-08-26 DIAGNOSIS — K2970 Gastritis, unspecified, without bleeding: Secondary | ICD-10-CM

## 2016-08-26 DIAGNOSIS — R634 Abnormal weight loss: Secondary | ICD-10-CM

## 2016-08-26 DIAGNOSIS — K2270 Barrett's esophagus without dysplasia: Secondary | ICD-10-CM | POA: Diagnosis not present

## 2016-08-26 DIAGNOSIS — D130 Benign neoplasm of esophagus: Secondary | ICD-10-CM | POA: Diagnosis not present

## 2016-08-26 MED ORDER — SODIUM CHLORIDE 0.9 % IV SOLN: 500.0000 mL | INTRAVENOUS | Status: DC

## 2016-08-26 NOTE — Patient Instructions (Addendum)
Nothing terrible seen - a few minor changes in the lining of the esophagus stomach and duodenum - I took biopsies and we will call.  I appreciate the opportunity to care for you. Gatha Mayer, MD, FACG     YOU HAD AN ENDOSCOPIC PROCEDURE TODAY AT [facility]:   Refer to the procedure report that was given to you for any specific questions about what was found during the examination.  If the procedure report does not answer your questions, please call your gastroenterologist to clarify.  If you requested that your care partner not be given the details of your procedure findings, then the procedure report has been included in a sealed envelope for you to review at your convenience later.  YOU SHOULD EXPECT: Some feelings of bloating in the abdomen. Passage of more gas than usual.  Walking can help get rid of the air that was put into your GI tract during the procedure and reduce the bloating. If you had a lower endoscopy (such as a colonoscopy or flexible sigmoidoscopy) you may notice spotting of blood in your stool or on the toilet paper. If you underwent a bowel prep for your procedure, you may not have a normal bowel movement for a few days.  Please Note:  You might notice some irritation and congestion in your nose or some drainage.  This is from the oxygen used during your procedure.  There is no need for concern and it should clear up in a day or so.  SYMPTOMS TO REPORT IMMEDIATELY:    Following upper endoscopy (EGD)  Vomiting of blood or coffee ground material  New chest pain or pain under the shoulder blades  Painful or persistently difficult swallowing  New shortness of breath  Fever of 100F or higher  Black, tarry-looking stools  For urgent or emergent issues, a gastroenterologist can be reached at any hour by calling [phone].   DIET:  We do recommend a small meal at first, but then you may proceed to your regular diet.  Drink plenty of fluids  but you should avoid alcoholic beverages for 24 hours.  ACTIVITY:  You should plan to take it easy for the rest of today and you should NOT DRIVE or use heavy machinery until tomorrow (because of the sedation medicines used during the test).    FOLLOW UP: Our staff will call the number listed on your records the next business day following your procedure to check on you and address any questions or concerns that you may have regarding the information given to you following your procedure. If we do not reach you, we will leave a message.  However, if you are feeling well and you are not experiencing any problems, there is no need to return our call.  We will assume that you have returned to your regular daily activities without incident.  If any biopsies were taken you will be contacted by phone or by letter within the next 1-3 weeks.  Please call us at [phone] if you have not heard about the biopsies in 3 weeks.    SIGNATURES/CONFIDENTIALITY: You and/or your care partner have signed paperwork which will be entered into your electronic medical record.  These signatures attest to the fact that that the information above on your After Visit Summary has been reviewed and is understood.  Full responsibility of the confidentiality of this discharge information lies with you and/or your care-partner.    Handouts were given to  your care partner on gastritis and hiatal hernia. You may resume your current medications today. Await biopsy results. Please call if any questions or concerns.

## 2016-08-26 NOTE — Progress Notes (Signed)
Pt requested a work note.  Note was given to pt's daughter, Janett Billow.  maw

## 2016-08-26 NOTE — Progress Notes (Signed)
No problems noted in the recovery room. maw 

## 2016-08-26 NOTE — Progress Notes (Signed)
Called to room to assist during endoscopic procedure.  Patient ID and intended procedure confirmed with present staff. Received instructions for my participation in the procedure from the performing physician.  

## 2016-08-26 NOTE — Progress Notes (Signed)
Dental advisory given to patient 

## 2016-08-26 NOTE — Progress Notes (Signed)
A/ox3 pleased with MAC, report to [facility]

## 2016-08-26 NOTE — Op Note (Signed)
[facility] Patient Name: Amy Hernandez Procedure Date: 08/26/2016 7:31 AM MRN: [id number] Endoscopist: Gatha Mayer , MD Age: 51 Referring MD:  Date of Birth: 04-10-1965 Gender: Female Account #: [id number] Procedure:                Upper GI endoscopy Indications:              Weight loss Medicines:                Propofol per Anesthesia, Monitored Anesthesia Care Procedure:                Pre-Anesthesia Assessment:                           - Prior to the procedure, a History and Physical                            was performed, and patient medications and                            allergies were reviewed. The patient's tolerance of                            previous anesthesia was also reviewed. The risks                            and benefits of the procedure and the sedation                            options and risks were discussed with the patient.                            All questions were answered, and informed consent                            was obtained. Prior Anticoagulants: The patient has                            taken no previous anticoagulant or antiplatelet                            agents. ASA Grade Assessment: II - A patient with                            mild systemic disease. After reviewing the risks                            and benefits, the patient was deemed in                            satisfactory condition to undergo the procedure.                           After obtaining informed consent, the endoscope was  passed under direct vision. Throughout the                            procedure, the patient's blood pressure, pulse, and                            oxygen saturations were monitored continuously. The                            Model GIF-HQ190 [id number]) scope was introduced                            through the mouth, and advanced to the second part                            of duodenum. The upper  GI endoscopy was                            accomplished without difficulty. The patient                            tolerated the procedure well. Scope In: Scope Out: Findings:                 A single diminutive nodule with a localized                            distribution was found in the lower third of the                            esophagus. Biopsies were taken with a cold forceps                            for histology. Verification of patient                            identification for the specimen was done. Estimated                            blood loss was minimal.                           There were esophageal mucosal changes suggestive of                            short-segment Barrett's esophagus present in the                            distal esophagus. The maximum longitudinal extent                            of these mucosal changes was 1 cm in length.                            Biopsies were  taken with a cold forceps for                            histology. Verification of patient identification                            for the specimen was done. Estimated blood loss was                            minimal.                           A small hiatal hernia was present.                           Diffuse mild inflammation characterized by erythema                            was found in the gastric body and in the gastric                            antrum. Biopsies were taken with a cold forceps for                            histology. Verification of patient identification                            for the specimen was done. Estimated blood loss was                            minimal.                           Diffuse mild mucosal changes characterized by                            notching of folds were found in the second portion                            of the duodenum. Biopsies were taken with a cold                            forceps for histology.  Verification of patient                            identification for the specimen was done. Estimated                            blood loss was minimal.                           The cardia and gastric fundus were normal on                            retroflexion.  The exam was otherwise without abnormality. Complications:            No immediate complications. Estimated Blood Loss:     Estimated blood loss was minimal. Impression:               - Nodule found in the esophagus. Biopsied.                           - Esophageal mucosal changes suggestive of                            short-segment Barrett's esophagus. Biopsied.                           - Small hiatal hernia.                           - Gastritis. Biopsied.                           - Mucosal changes in the duodenum. Biopsied.                           - The examination was otherwise normal. Recommendation:           - Patient has a contact number available for                            emergencies. The signs and symptoms of potential                            delayed complications were discussed with the                            patient. Return to normal activities tomorrow.                            Written discharge instructions were provided to the                            patient.                           - Resume previous diet.                           - Continue present medications.                           - Await pathology results. Gatha Mayer, MD 08/26/2016 7:58:42 AM This report has been signed electronically.

## 2016-08-27 ENCOUNTER — Telehealth: Payer: Self-pay | Admitting: *Deleted

## 2016-08-27 NOTE — Telephone Encounter (Signed)
  Follow up Call-  Call back number 08/26/2016  Post procedure Call Back phone  # [phone]  Permission to leave phone message Yes  Some recent data might be hidden     Patient questions:  Do you have a fever, pain , or abdominal swelling? No. Pain Score  0 *  Have you tolerated food without any problems? Yes.    Have you been able to return to your normal activities? Yes.    Do you have any questions about your discharge instructions: Diet   No. Medications  No. Follow up visit  No.  Do you have questions or concerns about your Care? No.  Actions: * If pain score is 4 or above: No action needed, pain <4.

## 2016-09-04 ENCOUNTER — Encounter: Payer: Self-pay | Admitting: Internal Medicine

## 2016-09-04 ENCOUNTER — Other Ambulatory Visit: Payer: Self-pay | Admitting: Internal Medicine

## 2016-09-04 DIAGNOSIS — D219 Benign neoplasm of connective and other soft tissue, unspecified: Secondary | ICD-10-CM

## 2016-09-04 DIAGNOSIS — K2270 Barrett's esophagus without dysplasia: Secondary | ICD-10-CM

## 2016-09-04 DIAGNOSIS — R197 Diarrhea, unspecified: Secondary | ICD-10-CM | POA: Insufficient documentation

## 2016-09-04 HISTORY — DX: Benign neoplasm of connective and other soft tissue, unspecified: D21.9

## 2016-09-04 HISTORY — DX: Barrett's esophagus without dysplasia: K22.70

## 2016-09-04 MED ORDER — PANTOPRAZOLE SODIUM 40 MG PO TBEC: 40.0000 mg | DELAYED_RELEASE_TABLET | Freq: Every day | ORAL | 3 refills | Status: DC

## 2016-09-04 NOTE — Progress Notes (Signed)
I called her - reviewed dx  1) Recall EGD 1 year f/u granular cell tumor and Barrett's esophagus 2) I am Rx ing pantoprazole 40 mg qd - I have sent Rx 3) I have ordered stool studies GI path panel and C diff - she will come to lab 4) No letter

## 2016-09-04 NOTE — Progress Notes (Signed)
Message left - will call back

## 2016-09-08 ENCOUNTER — Other Ambulatory Visit: Payer: Self-pay | Admitting: Internal Medicine

## 2016-09-08 ENCOUNTER — Ambulatory Visit: Payer: BLUE CROSS/BLUE SHIELD | Admitting: Internal Medicine

## 2016-09-08 DIAGNOSIS — E785 Hyperlipidemia, unspecified: Secondary | ICD-10-CM

## 2016-09-08 DIAGNOSIS — I10 Essential (primary) hypertension: Secondary | ICD-10-CM

## 2016-09-17 ENCOUNTER — Ambulatory Visit: Payer: BLUE CROSS/BLUE SHIELD | Admitting: Internal Medicine

## 2016-10-05 ENCOUNTER — Other Ambulatory Visit: Payer: Self-pay | Admitting: Internal Medicine

## 2016-10-05 DIAGNOSIS — B20 Human immunodeficiency virus [HIV] disease: Secondary | ICD-10-CM

## 2016-10-22 ENCOUNTER — Telehealth: Payer: Self-pay | Admitting: Internal Medicine

## 2016-10-22 NOTE — Telephone Encounter (Signed)
Patient notified that the kit can't be mailed.

## 2016-10-26 ENCOUNTER — Other Ambulatory Visit: Payer: BLUE CROSS/BLUE SHIELD

## 2016-11-05 ENCOUNTER — Telehealth: Payer: Self-pay | Admitting: Internal Medicine

## 2016-11-05 ENCOUNTER — Telehealth: Payer: Self-pay | Admitting: *Deleted

## 2016-11-05 NOTE — Telephone Encounter (Signed)
Patient called very emotional about the fact that she is still not feeling well and loosing weight. She was also asking if we can fill out her FMLA forms or if they should go to Dr Carlean Purl. Advised her what we see her for is chronic and lifetime so we will fill out a FMLA form but that Dr Carlean Purl will need to fill one out as well as she is seeing him for the GI issue right now and until resolved which we can not account for. She advised she understands and will send him the paperwork as well. Encouraged her to continue to seek help as she was very discouraged by the time it is taking to find a resolution.

## 2016-11-06 NOTE — Telephone Encounter (Signed)
Patient is asking if we will fill out FMLA.  She is advised that she can discuss with Dr. Carlean Purl when she comes for her appt on 11/5

## 2016-11-19 ENCOUNTER — Telehealth: Payer: Self-pay | Admitting: Internal Medicine

## 2016-11-19 NOTE — Telephone Encounter (Signed)
Patient has continued vomiting.  I moved her appt up to Monday 11/23/16.  She will continue zofran q 8 hours prn until OV.

## 2016-11-23 ENCOUNTER — Encounter (INDEPENDENT_AMBULATORY_CARE_PROVIDER_SITE_OTHER): Payer: Self-pay

## 2016-11-23 ENCOUNTER — Other Ambulatory Visit (INDEPENDENT_AMBULATORY_CARE_PROVIDER_SITE_OTHER): Payer: BLUE CROSS/BLUE SHIELD

## 2016-11-23 ENCOUNTER — Ambulatory Visit (INDEPENDENT_AMBULATORY_CARE_PROVIDER_SITE_OTHER): Payer: BLUE CROSS/BLUE SHIELD | Admitting: Internal Medicine

## 2016-11-23 ENCOUNTER — Encounter: Payer: Self-pay | Admitting: Internal Medicine

## 2016-11-23 VITALS — BP 96/66 | HR 96 | Ht 62.25 in | Wt 140.2 lb

## 2016-11-23 DIAGNOSIS — R634 Abnormal weight loss: Secondary | ICD-10-CM

## 2016-11-23 DIAGNOSIS — R630 Anorexia: Secondary | ICD-10-CM

## 2016-11-23 DIAGNOSIS — R5383 Other fatigue: Secondary | ICD-10-CM | POA: Diagnosis not present

## 2016-11-23 DIAGNOSIS — R110 Nausea: Secondary | ICD-10-CM | POA: Diagnosis not present

## 2016-11-23 LAB — COMPREHENSIVE METABOLIC PANEL
ALBUMIN: 4.4 g/dL (ref 3.5–5.2)
ALT: 7 U/L (ref 0–35)
AST: 14 U/L (ref 0–37)
Alkaline Phosphatase: 147 U/L — ABNORMAL HIGH (ref 39–117)
BUN: 13 mg/dL (ref 6–23)
CALCIUM: 9.7 mg/dL (ref 8.4–10.5)
CHLORIDE: 102 meq/L (ref 96–112)
CO2: 27 meq/L (ref 19–32)
CREATININE: 0.81 mg/dL (ref 0.40–1.20)
GFR: 96.4 mL/min (ref >60.00)
Glucose, Bld: 98 mg/dL (ref 70–99)
POTASSIUM: 3.1 meq/L — AB (ref 3.5–5.1)
SODIUM: 138 meq/L (ref 135–145)
Total Bilirubin: 0.4 mg/dL (ref 0.2–1.2)
Total Protein: 7.7 g/dL (ref 6.0–8.3)

## 2016-11-23 MED ORDER — MIRTAZAPINE 15 MG PO TABS: 15.0000 mg | ORAL_TABLET | Freq: Every day | ORAL | 2 refills | Status: DC

## 2016-11-23 NOTE — Patient Instructions (Addendum)
  We have sent the following medications to your pharmacy for you to pick up at your convenience: Remeron, call with any issues and call and update [place] around Thanksgiving.   Please follow up with [place] in January 2019, appointment made for 02/04/17.  Bring in the papers you need filled out.   I appreciate the opportunity to care for you. Silvano Rusk, MD, [facility]

## 2016-11-23 NOTE — Progress Notes (Signed)
Amy Hernandez 51 y.o. 10-Mar-1965 [id number]  Assessment & Plan:   Encounter Diagnoses  Name Primary?  . Chronic nausea Yes  . Fatigue, unspecified type   . Unintentional weight loss   . Anorexia    Remeron 7.5 - 15 mg qhs to treat nausea and anorexia.  I think she is probably somewhat depressed.  Whether that is reactive or the underlying problem I am not sure.  So far workup with EGD and CT scanning and labs is unrevealing.  Of the medication she will start with 7.5 mg nightly for the first week and go to 15 mg.   I do not think a colonoscopy would provide any useful information.  Ending upon clinical course we could need to.  RTC 2 mos   I appreciate the opportunity to care for this patient. CC: Dr. Scharlene Gloss  Subjective:   Chief Complaint: Nausea weight loss  HPI Amy Hernandez is here quite distraught tearful at times because her appetite is off and she continues to have nausea more than anything.  She is feels very tired which is been going on for months.  She denies anhedonia.  Her EGD was unrevealing, CT scanning and labs have been unrevealing.  She does not have any diarrhea which she talked about before and we had ordered a C. difficile and pathogen panel but really she is having formed bowel movements at this time.  Appetite is off.  She gags and gets nauseous and regurgitates a bit but does not seem to have much if any forceful vomiting.  She denies sleeping problems.  She has had difficulties at work because she has had to put customers on hold etc. and her boss has advised her to file FMLA.  Wt Readings from Last 3 Encounters:  11/23/16 140 lb 4 oz (63.6 kg)  08/26/16 155 lb (70.3 kg)  08/17/16 155 lb (70.3 kg)    Allergies  Allergen Reactions  . Codeine     REACTION: hallucinations   Current Meds  Medication Sig  . ATRIPLA 600-200-300 MG tablet TAKE 1 TABLET BY MOUTH AT BEDTIME  . hyoscyamine (LEVBID) 0.375 MG 12 hr tablet Take 1 tablet (0.375 mg total) by mouth 2  (two) times daily.  Marland Kitchen lisinopril-hydrochlorothiazide (PRINZIDE,ZESTORETIC) 20-25 MG tablet TAKE 1 TABLET BY MOUTH DAILY  . ondansetron (ZOFRAN ODT) 8 MG disintegrating tablet Take 1 tablet (8 mg total) by mouth every 8 (eight) hours as needed for nausea.  . pantoprazole (PROTONIX) 40 MG tablet Take 1 tablet (40 mg total) by mouth daily before breakfast.  . simvastatin (ZOCOR) 20 MG tablet TAKE 1 TABLET(20 MG) BY MOUTH DAILY  . zolpidem (AMBIEN CR) 12.5 MG CR tablet Take 1 tablet (12.5 mg total) by mouth at bedtime as needed. for sleep   Past Medical History:  Diagnosis Date  . Barrett's esophagus - short segment 09/04/2016  . Bartholin gland cyst   . Cervical intraepithelial neoplasia (CIN) 2005   high grade cervical dysplasia prior to total hysterectomy - needs yearly vaginal paps   . 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 HIV (human immunodeficiency virus infection) ([place])   . Hyperlipidemia   . Hypertension   . IBS (irritable bowel syndrome)   . Insomnia   . Substance abuse [facility])    Past Surgical History:  Procedure Laterality Date  . ABDOMINAL HYSTERECTOMY  09/2003  . ANAL SPHINCTEROTOMY    . CHOLECYSTECTOMY  1990  . COLONOSCOPY  2009   NL  .  ESOPHAGOGASTRODUODENOSCOPY  2018  . LEEP  2000   history of CIN   . TUBAL LIGATION       Review of Systems As per HPI  Objective:   Physical Exam @BP  96/66 (BP Location: Left Arm, Patient Position: Sitting, Cuff Size: Normal)   Pulse 96   Ht 5' 2.25" (1.581 m)   Wt 140 lb 4 oz (63.6 kg)   BMI 25.45 kg/m @  General: tearful, anxious Eyes:   anicteric Lungs:  clear Heart::  S1S2 no rubs, murmurs or gallops Abdomen:  soft and nontender, BS+ Ext:   no edema, cyanosis or clubbing    Data Reviewed:   As per HPI  25 minutes time spent with patient > half in counseling coordination of care

## 2016-11-24 ENCOUNTER — Encounter: Payer: Self-pay | Admitting: Internal Medicine

## 2016-11-26 ENCOUNTER — Other Ambulatory Visit: Payer: Self-pay

## 2016-11-26 DIAGNOSIS — R748 Abnormal levels of other serum enzymes: Secondary | ICD-10-CM

## 2016-11-26 MED ORDER — POTASSIUM CHLORIDE CRYS ER 20 MEQ PO TBCR: 20.0000 meq | EXTENDED_RELEASE_TABLET | Freq: Every day | ORAL | 0 refills | Status: DC

## 2016-11-26 NOTE — Progress Notes (Signed)
Labs show low K and persistent mild elevation of alk phos  1) K dur 20 mEq daily # 90 no refill 2) mitochondrial antibodies - dx abnormal alkaline phosphatase

## 2016-11-30 ENCOUNTER — Ambulatory Visit: Payer: BLUE CROSS/BLUE SHIELD | Admitting: Internal Medicine

## 2016-12-01 ENCOUNTER — Other Ambulatory Visit: Payer: Self-pay | Admitting: Internal Medicine

## 2016-12-01 DIAGNOSIS — F5101 Primary insomnia: Secondary | ICD-10-CM

## 2016-12-04 ENCOUNTER — Other Ambulatory Visit: Payer: Self-pay | Admitting: *Deleted

## 2016-12-04 DIAGNOSIS — F5101 Primary insomnia: Secondary | ICD-10-CM

## 2016-12-04 MED ORDER — ZOLPIDEM TARTRATE ER 12.5 MG PO TBCR: 12.5000 mg | EXTENDED_RELEASE_TABLET | Freq: Every evening | ORAL | 0 refills | Status: DC | PRN

## 2016-12-10 ENCOUNTER — Encounter: Payer: Self-pay | Admitting: Infectious Diseases

## 2016-12-10 ENCOUNTER — Ambulatory Visit (INDEPENDENT_AMBULATORY_CARE_PROVIDER_SITE_OTHER): Payer: BLUE CROSS/BLUE SHIELD | Admitting: Infectious Diseases

## 2016-12-10 VITALS — BP 118/81 | HR 89 | Temp 98.4°F | Wt 141.8 lb

## 2016-12-10 DIAGNOSIS — F329 Major depressive disorder, single episode, unspecified: Secondary | ICD-10-CM

## 2016-12-10 DIAGNOSIS — R634 Abnormal weight loss: Secondary | ICD-10-CM

## 2016-12-10 DIAGNOSIS — K2270 Barrett's esophagus without dysplasia: Secondary | ICD-10-CM | POA: Diagnosis not present

## 2016-12-10 DIAGNOSIS — B20 Human immunodeficiency virus [HIV] disease: Secondary | ICD-10-CM | POA: Diagnosis not present

## 2016-12-10 DIAGNOSIS — Z23 Encounter for immunization: Secondary | ICD-10-CM

## 2016-12-10 DIAGNOSIS — R748 Abnormal levels of other serum enzymes: Secondary | ICD-10-CM

## 2016-12-10 DIAGNOSIS — R5383 Other fatigue: Secondary | ICD-10-CM

## 2016-12-10 NOTE — Progress Notes (Signed)
Amy Hernandez 05/17/1965 [id number]  PCP: Patient, No Pcp Per    Brief Narrative: Amy Hernandez is a 51 y.o. AA female with well controlled HIV on Atripla. Attempted to change to [facility] however did not like it and requested to remain on Atripla. HIV Risk: heterosexual, OI Hx: none Genotype: sensitive    HPI: Amy Hernandez presents to clinic today for HIV follow up, depression and weight loss. She is accompanied by her best friend.   HIV = endorses excellent adherence with her Atripla and has not missed a dose since she was last seen in clinic. Endorses no complaints today suggestive of associated opportunistic infection or advancing HIV disease such as fevers, night sweats, weight loss, anorexia, cough, SOB, nausea, vomiting, diarrhea, headache, sensory changes, lymphadenopathy or oral thrush. Not currently sexually active.   Weight Loss = Very upset at her continued weight loss. Now total unintentional loss 42 lbs. Describes mostly a lack of desire to eat. Does not like the Boost/Ensure supplements as she is lactose intolerant and they cause her stomach trouble. She works from home and just does not often feel like making food for herself. Has been in care with Dr. Carlean Purl with [facility]. EGD was done and she tells me she has a hiatal hernia and some erosion. Was started on Protonix QHS and this has really helped with her epigastric pain and morning nausea/vomiting. She is not currently troubled with diarrhea and tells me this has improved as well. Has not started taking the Mirtazapine rx'd by Dr. Carlean Purl yet.   Depression = One of the main reasons she wanted to come to clinic today was to discuss depression. She lives at home with her two adolescent children and feels that she gets no respect from them and they are constantly taking advantage of her and do nothing in return. "When is it my turn" is a phrase she references often. She reports feeling depressed, hopeless with her current situation and tearful  often. Loss of interest in previously enjoyable activities. HIgh stress job in Therapist, art and feels "jabbed at all day" at work and then when her kids come home it continues without break.    Review of Systems: Review of Systems  Constitutional: Positive for malaise/fatigue and weight loss. Negative for chills, diaphoresis and fever.  HENT: Negative for sore throat.   Respiratory: Negative for cough, hemoptysis, sputum production and shortness of breath.   Cardiovascular: Negative for chest pain, palpitations and leg swelling.  Gastrointestinal: Positive for abdominal pain. Negative for blood in stool, constipation, diarrhea, nausea and vomiting.  Genitourinary: Negative for dysuria, frequency and urgency.  Musculoskeletal: Negative for joint pain and myalgias.  Skin: Negative for rash.  Neurological: Negative for dizziness and headaches.  Endo/Heme/Allergies: Negative for polydipsia.  Psychiatric/Behavioral: Positive for depression. Negative for substance abuse and suicidal ideas. The patient is nervous/anxious. The patient does not have insomnia.    Patient Active Problem List   Diagnosis Date Noted  . Depression 12/12/2016  . Elevated alkaline phosphatase level 12/12/2016  . 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 Barrett's esophagus - short segment 09/04/2016  . Weight loss, unintentional 07/23/2016  . Chest congestion 02/04/2016  . IBS (irritable bowel syndrome) 08/28/2014  . Insomnia 08/28/2014  . Screening examination for venereal disease 05/16/2013  . Encounter for long-term (current) use of medications 05/16/2013  . Bartholin gland cyst 07/19/2012  . History of hysterectomy   . HLD (hyperlipidemia) 02/07/2009  . ADJ DISORDER WITH MIXED  ANXIETY & DEPRESSED MOOD 04/07/2007  . HYPERGLYCEMIA 06/04/2006  . Human immunodeficiency virus (HIV) disease ([place]) 12/05/2005  . HTN (hypertension) 12/05/2005    Past Medical History:  Diagnosis Date  .  Barrett's esophagus - short segment 09/04/2016  . Bartholin gland cyst   . Cervical intraepithelial neoplasia (CIN) 2005   high grade cervical dysplasia prior to total hysterectomy - needs yearly vaginal paps   . 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 HIV (human immunodeficiency virus infection) ([place])   . Hyperlipidemia   . Hypertension   . IBS (irritable bowel syndrome)   . Insomnia   . Substance abuse [facility])    Outpatient Medications Prior to Visit  Medication Sig Dispense Refill  . ATRIPLA 600-200-300 MG tablet TAKE 1 TABLET BY MOUTH AT BEDTIME 30 tablet 2  . hyoscyamine (LEVBID) 0.375 MG 12 hr tablet Take 1 tablet (0.375 mg total) by mouth 2 (two) times daily. 60 tablet 0  . lisinopril-hydrochlorothiazide (PRINZIDE,ZESTORETIC) 20-25 MG tablet TAKE 1 TABLET BY MOUTH DAILY 90 tablet 1  . pantoprazole (PROTONIX) 40 MG tablet Take 1 tablet (40 mg total) by mouth daily before breakfast. 90 tablet 3  . potassium chloride SA (K-DUR,KLOR-CON) 20 MEQ tablet Take 1 tablet (20 mEq total) by mouth daily. 90 tablet 0  . simvastatin (ZOCOR) 20 MG tablet TAKE 1 TABLET(20 MG) BY MOUTH DAILY 90 tablet 1  . zolpidem (AMBIEN CR) 12.5 MG CR tablet Take 1 tablet (12.5 mg total) at bedtime as needed by mouth. for sleep 90 tablet 0  . mirtazapine (REMERON) 15 MG tablet Take 1 tablet (15 mg total) by mouth at bedtime. Start w/ 1/2 tab first week 30 tablet 2  . ondansetron (ZOFRAN ODT) 8 MG disintegrating tablet Take 1 tablet (8 mg total) by mouth every 8 (eight) hours as needed for nausea. (Patient not taking: Reported on 12/10/2016) 30 tablet 0   No facility-administered medications prior to visit.     Allergies  Allergen Reactions  . Codeine     REACTION: hallucinations    Physical Assessment & Diagnostic Findings:  Vitals:   12/10/16 0925  BP: 118/81  Pulse: 89  Temp: 98.4 F (36.9 C)  TempSrc: Oral  Weight: 141 lb 12.8 oz (64.3 kg)   Body mass index is 25.73  kg/m.  Physical Exam  Constitutional: She is oriented to person, place, and time. No distress.  Fatigued appearing. Loose clothes and has lost more weight since last visit. Thin, drawn face.   HENT:  Mouth/Throat: Oropharynx is clear and moist. No oral lesions. No dental abscesses. No oropharyngeal exudate.  Eyes: Pupils are equal, round, and reactive to light. No scleral icterus.  Neck: Neck supple. No thyromegaly present.  Cardiovascular: Normal rate, regular rhythm and normal heart sounds.  No murmur heard. Pulmonary/Chest: Effort normal and breath sounds normal.  Abdominal: Soft. Bowel sounds are normal. She exhibits no distension, no ascites and no mass. There is no hepatosplenomegaly. There is no tenderness. There is no CVA tenderness.    Musculoskeletal: Normal range of motion. She exhibits no tenderness.  Lymphadenopathy:    She has no cervical adenopathy.  Neurological: She is alert and oriented to person, place, and time.  Skin: Skin is warm and dry. No rash noted.  Psychiatric: Mood, affect and judgment normal.  Tearful throughout entire visit. Depressed mood but some moments of happiness in discussing the improvements that have been made in her symptoms and support of her friend.  Lab Results Lab Results  Component Value Date   CREATININE 0.81 11/23/2016   BUN 13 11/23/2016   NA 138 11/23/2016   K 3.1 (L) 11/23/2016   CL 102 11/23/2016   CO2 27 11/23/2016    Lab Results  Component Value Date   ALT 7 11/23/2016   AST 14 11/23/2016   ALKPHOS 147 (H) 11/23/2016   BILITOT 0.4 11/23/2016    Lab Results  Component Value Date   CHOL 204 (H) 06/13/2015   HDL 55 06/13/2015   LDLCALC 111 06/13/2015   TRIG 189 (H) 06/13/2015   CHOLHDL 3.7 06/13/2015   HIV 1 RNA Quant (copies/mL)  Date Value  02/04/2016 <20  06/13/2015 <20  08/28/2014 <20   CD4 T Cell Abs (/uL)  Date Value  12/10/2016 980  07/23/2016 1,340  02/04/2016 1,250   Problem List Items  Addressed This Visit      Digestive   Barrett's esophagus - short segment    I have encouraged her to continue taking her Protonix. Reminded her that she was recommended to repeat her EGD in 1 year. Screening colonoscopy due at that time as well.         Other   Human immunodeficiency virus (HIV) disease ([place])    Amy Hernandez is doing well on her HIV regimen and extremely well controlled. However I do worry about efavirenz's contribution to her depression. Will for now continue Atripla but may need to consider changing medication should her depression persist despite medication intervention. Will check VL/CD4 today. She could have q6-22m FU for her HIV however will have her return sooner to f/u on other issues.       Weight loss, unintentional    I discussed with her today that we should consider treating her depression to see if this will impact unintentional weight loss. Seems that the rate of loss has slowed down since her last visit. I have advised she try powdered protein shakes that are whey free to help with her tolerability in addition to 3 meals a day. Advised to consider some more simple options that require less prep or making food in bulk or with a friend to have eating/food prep become more social. Also would encourage multivitamin until return of more substantial diet intake. Hopeful the Mirtazepine will help increase her appetite. She is not currently having diarrhea or fevers - not likely d/t CDiff but low threshold to test in future should this return esp with addition of PPI. Not likely this is d/t opportunistic gastric pathology with her very well controlled HIV.       Depression    Will start her on Mirtazapine 15mg  today considering her decreased appetite. Can titrate up for effect. Offered services with counselor at the clinic however she is uncertain this will work d/t transportation barriers. Advised to reach out to employer for options that may be available to her. I asked her to  try to surround herself with a good support system. Will have her return in 2 months to monitor (she is not able to come in 1 month to evaluate sooner).        Elevated alkaline phosphatase level - Primary    Dates back to 2013. Will check mitrochondrial Ab per Dr. Celesta Aver note and forward him results.       Relevant Orders   Mitochondrial Antibodies    Other Visit Diagnoses    HIV (human immunodeficiency virus infection) ([place])       Relevant Orders  HIV 1 RNA quant-no reflex-bld   T-helper cell (CD4)- ([facility] clinic only) (Completed)   Other fatigue       Relevant Orders   Vitamin D 1,25 dihydroxy   Need for immunization against influenza       Relevant Orders   Flu Vaccine QUAD 36+ mos IM (Completed)     [name], MSN, NP-C [facility] Pager: [phone]

## 2016-12-10 NOTE — Patient Instructions (Addendum)
Lets try you on Mirtazapine 15 mg once a day. Try taking at 5pm for now. This can cause you to be tired but should help with your appetite. We can increase this if needed.   Continue taking your Protonix once a day.   Lets have you back in 2 months.

## 2016-12-11 LAB — T-HELPER CELL (CD4) - (RCID CLINIC ONLY)
CD4 % Helper T Cell: 30 % — ABNORMAL LOW (ref 33–55)
CD4 T Cell Abs: 980 /uL (ref 400–2700)

## 2016-12-12 DIAGNOSIS — R748 Abnormal levels of other serum enzymes: Secondary | ICD-10-CM | POA: Insufficient documentation

## 2016-12-12 DIAGNOSIS — F32A Depression, unspecified: Secondary | ICD-10-CM | POA: Insufficient documentation

## 2016-12-12 DIAGNOSIS — F329 Major depressive disorder, single episode, unspecified: Secondary | ICD-10-CM | POA: Insufficient documentation

## 2016-12-12 NOTE — Assessment & Plan Note (Addendum)
Dates back to 2013. Will check mitrochondrial Ab per Dr. Celesta Aver note and forward him results.

## 2016-12-12 NOTE — Assessment & Plan Note (Signed)
Will start her on Mirtazapine 15mg  today considering her decreased appetite. Can titrate up for effect. Offered services with counselor at the clinic however she is uncertain this will work d/t transportation barriers. Advised to reach out to employer for options that may be available to her. I asked her to try to surround herself with a good support system. Will have her return in 2 months to monitor (she is not able to come in 1 month to evaluate sooner).

## 2016-12-12 NOTE — Assessment & Plan Note (Addendum)
I discussed with her today that we should consider treating her depression to see if this will impact unintentional weight loss. Seems that the rate of loss has slowed down since her last visit. I have advised she try powdered protein shakes that are whey free to help with her tolerability in addition to 3 meals a day. Advised to consider some more simple options that require less prep or making food in bulk or with a friend to have eating/food prep become more social. Also would encourage multivitamin until return of more substantial diet intake. Hopeful the Mirtazepine will help increase her appetite. She is not currently having diarrhea or fevers - not likely d/t CDiff but low threshold to test in future should this return esp with addition of PPI. Not likely this is d/t opportunistic gastric pathology with her very well controlled HIV.

## 2016-12-12 NOTE — Assessment & Plan Note (Addendum)
Amy Hernandez is doing well on her HIV regimen and extremely well controlled. However I do worry about efavirenz's contribution to her depression. Will for now continue Atripla but may need to consider changing medication should her depression persist despite medication intervention. Will check VL/CD4 today. She could have q6-78m FU for her HIV however will have her return sooner to f/u on other issues.

## 2016-12-12 NOTE — Assessment & Plan Note (Signed)
I have encouraged her to continue taking her Protonix. Reminded her that she was recommended to repeat her EGD in 1 year. Screening colonoscopy due at that time as well.

## 2016-12-14 LAB — VITAMIN D 1,25 DIHYDROXY
VITAMIN D3 1, 25 (OH): 43 pg/mL
Vitamin D 1, 25 (OH)2 Total: 43 pg/mL (ref 18–72)
Vitamin D2 1, 25 (OH)2: <8 pg/mL

## 2016-12-14 LAB — MITOCHONDRIAL ANTIBODIES

## 2016-12-15 ENCOUNTER — Ambulatory Visit: Payer: BLUE CROSS/BLUE SHIELD | Admitting: Internal Medicine

## 2016-12-15 LAB — HIV-1 RNA QUANT-NO REFLEX-BLD
HIV 1 RNA Quant: <20 copies/mL
HIV-1 RNA Quant, Log: <1.3 Log copies/mL

## 2016-12-25 ENCOUNTER — Other Ambulatory Visit: Payer: Self-pay | Admitting: Internal Medicine

## 2016-12-25 DIAGNOSIS — F5101 Primary insomnia: Secondary | ICD-10-CM

## 2017-01-07 ENCOUNTER — Other Ambulatory Visit: Payer: Self-pay | Admitting: Internal Medicine

## 2017-01-07 DIAGNOSIS — B20 Human immunodeficiency virus [HIV] disease: Secondary | ICD-10-CM

## 2017-02-04 ENCOUNTER — Ambulatory Visit: Payer: BLUE CROSS/BLUE SHIELD | Admitting: Internal Medicine

## 2017-02-09 ENCOUNTER — Ambulatory Visit (INDEPENDENT_AMBULATORY_CARE_PROVIDER_SITE_OTHER): Payer: BLUE CROSS/BLUE SHIELD | Admitting: Infectious Diseases

## 2017-02-09 ENCOUNTER — Encounter: Payer: Self-pay | Admitting: Infectious Diseases

## 2017-02-09 VITALS — BP 138/89 | HR 88 | Temp 98.6°F | Wt 148.0 lb

## 2017-02-09 DIAGNOSIS — F329 Major depressive disorder, single episode, unspecified: Secondary | ICD-10-CM | POA: Diagnosis not present

## 2017-02-09 DIAGNOSIS — F5101 Primary insomnia: Secondary | ICD-10-CM | POA: Diagnosis not present

## 2017-02-09 DIAGNOSIS — R634 Abnormal weight loss: Secondary | ICD-10-CM | POA: Diagnosis not present

## 2017-02-09 DIAGNOSIS — B20 Human immunodeficiency virus [HIV] disease: Secondary | ICD-10-CM

## 2017-02-09 NOTE — Assessment & Plan Note (Signed)
She is still under very good control on Atripla. She can come back in 6 mo with VL/CD4 prior to visit.

## 2017-02-09 NOTE — Assessment & Plan Note (Addendum)
Improving on Remeron. Continue monitoring at home scales.

## 2017-02-09 NOTE — Patient Instructions (Signed)
Continue your Remeron as you are currently taking it.   We wil see you back in 6 months with labs a few weeks prior to.   You look FABULOUS!! I am so happy to see you smiling.

## 2017-02-09 NOTE — Progress Notes (Signed)
Amy Hernandez [birth year] [id number]  PCP: Patient, No Pcp Per    Brief Narrative: Amy Hernandez is a 52 y.o. AA female with well controlled HIV on Atripla. Attempted to change to [facility] however did not like it and requested to remain on Atripla. HIV Risk: heterosexual, OI Hx: none Genotype: sensitive  Patient Active Problem List   Diagnosis Date Noted  . Depression 12/12/2016  . Elevated alkaline phosphatase level 12/12/2016  . 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 Barrett's esophagus - short segment 09/04/2016  . Weight loss, unintentional 07/23/2016  . IBS (irritable bowel syndrome) 08/28/2014  . Insomnia 08/28/2014  . Encounter for long-term (current) use of medications 05/16/2013  . Bartholin gland cyst 07/19/2012  . History of hysterectomy   . HLD (hyperlipidemia) 02/07/2009  . Human immunodeficiency virus (HIV) disease ([place]) 12/05/2005  . HTN (hypertension) 12/05/2005    HPI:  Amy Hernandez his here today for follow up on her depression, weight loss and HIV. She has continued to have superior adherence with her Atripla. No side effects noted that are concerning to her and she has access to her medications. She has gained 7 lbs since her last visit 2 months ago since starting her Remeron. Sleep quality is much improved and her appetite is much better. Abdominal pain is completely gone. She is not having episodes of sadness or crying anymore and finds she is much happier lately.   Review of Systems: Review of Systems  Constitutional: Negative for chills, diaphoresis, fever, malaise/fatigue and weight loss.  HENT: Negative for sore throat.   Respiratory: Negative for cough, hemoptysis, sputum production and shortness of breath.   Cardiovascular: Negative for chest pain, palpitations and leg swelling.  Gastrointestinal: Negative for abdominal pain, blood in stool, constipation, diarrhea, nausea and vomiting.  Genitourinary: Negative for dysuria, frequency and urgency.    Musculoskeletal: Negative for joint pain and myalgias.  Skin: Negative for rash.  Neurological: Negative for dizziness and headaches.  Endo/Heme/Allergies: Negative for polydipsia.  Psychiatric/Behavioral: Negative for depression, substance abuse and suicidal ideas. The patient is not nervous/anxious and does not have insomnia.     Past Medical History:  Diagnosis Date  . Barrett's esophagus - short segment 09/04/2016  . Bartholin gland cyst   . Cervical intraepithelial neoplasia (CIN) 2005   high grade cervical dysplasia prior to total hysterectomy - needs yearly vaginal paps   . 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 HIV (human immunodeficiency virus infection) (Amy Hernandez)   . Hyperlipidemia   . Hypertension   . IBS (irritable bowel syndrome)   . Insomnia   . Substance abuse [facility])    Outpatient Medications Prior to Visit  Medication Sig Dispense Refill  . ATRIPLA 600-200-300 MG tablet TAKE 1 TABLET BY MOUTH AT BEDTIME 30 tablet 5  . hyoscyamine (LEVBID) 0.375 MG 12 hr tablet Take 1 tablet (0.375 mg total) by mouth 2 (two) times daily. 60 tablet 0  . lisinopril-hydrochlorothiazide (PRINZIDE,ZESTORETIC) 20-25 MG tablet TAKE 1 TABLET BY MOUTH DAILY 90 tablet 1  . mirtazapine (REMERON) 15 MG tablet Take 1 tablet (15 mg total) by mouth at bedtime. Start w/ 1/2 tab first week 30 tablet 2  . ondansetron (ZOFRAN ODT) 8 MG disintegrating tablet Take 1 tablet (8 mg total) by mouth every 8 (eight) hours as needed for nausea. 30 tablet 0  . pantoprazole (PROTONIX) 40 MG tablet Take 1 tablet (40 mg total) by mouth daily before breakfast. 90 tablet 3  .  potassium chloride SA (K-DUR,KLOR-CON) 20 MEQ tablet Take 1 tablet (20 mEq total) by mouth daily. 90 tablet 0  . simvastatin (ZOCOR) 20 MG tablet TAKE 1 TABLET(20 MG) BY MOUTH DAILY 90 tablet 1  . zolpidem (AMBIEN CR) 12.5 MG CR tablet TAKE 1 TABLET BY MOUTH EVERY NIGHT AT BEDTIME AS NEEDED FOR SLEEP 90 tablet 0   No  facility-administered medications prior to visit.     Allergies  Allergen Reactions  . Codeine     REACTION: hallucinations    Physical Assessment & Diagnostic Findings: Vitals:   02/09/17 0858  BP: 138/89  Pulse: 88  Temp: 98.6 F (37 C)  TempSrc: Oral  Weight: 148 lb (67.1 kg)   Body mass index is 26.85 kg/m.  Physical Exam  Constitutional: She is oriented to person, place, and time and well-developed, well-nourished, and in no distress.  HENT:  Mouth/Throat: Oropharynx is clear and moist.  Eyes: No scleral icterus.  Cardiovascular: Normal rate, regular rhythm and normal heart sounds.  Pulmonary/Chest: Breath sounds normal. No respiratory distress.  Abdominal: Soft. Bowel sounds are normal. She exhibits no distension. There is no tenderness.  Lymphadenopathy:    She has no cervical adenopathy.  Neurological: She is alert and oriented to person, place, and time.  Skin: Skin is warm and dry. No erythema.  Psychiatric: Mood and affect normal.  Vitals reviewed.   Lab Results Lab Results  Component Value Date   CREATININE 0.81 11/23/2016   BUN 13 11/23/2016   NA 138 11/23/2016   K 3.1 (L) 11/23/2016   CL 102 11/23/2016   CO2 27 11/23/2016    Lab Results  Component Value Date   ALT 7 11/23/2016   AST 14 11/23/2016   ALKPHOS 147 (H) 11/23/2016   BILITOT 0.4 11/23/2016    Lab Results  Component Value Date   CHOL 204 (H) 06/13/2015   HDL 55 06/13/2015   LDLCALC 111 06/13/2015   TRIG 189 (H) 06/13/2015   CHOLHDL 3.7 06/13/2015   HIV 1 RNA Quant (copies/mL)  Date Value  12/10/2016 <20 NOT DETECTED  02/04/2016 <20  06/13/2015 <20   CD4 T Cell Abs (/uL)  Date Value  12/10/2016 980  07/23/2016 1,340  02/04/2016 1,250   Problem List Items Addressed This Visit      Other   Depression    Currently under good control with better social support and Remeron 15 mg. Will continue this for her for 6 - 12 months.       Human immunodeficiency virus (HIV)  disease (Amy Hernandez) - Primary (Chronic)    She is still under very good control on Atripla. She can come back in 6 mo with VL/CD4 prior to visit.       Relevant Orders   T-helper cell (CD4)- ([facility] clinic only)   HIV 1 RNA quant-no reflex-bld   CBC   Comprehensive metabolic panel   Lipid panel   RPR   Insomnia   Weight loss, unintentional    Improving on Remeron. Continue monitoring at home scales.         [name], MSN, NP-C [facility] [place] Pager: [phone]

## 2017-02-09 NOTE — Assessment & Plan Note (Signed)
Currently under good control with better social support and Remeron 15 mg. Will continue this for her for 6 - 12 months.

## 2017-03-01 ENCOUNTER — Other Ambulatory Visit: Payer: Self-pay | Admitting: Internal Medicine

## 2017-03-02 NOTE — Telephone Encounter (Signed)
How many refills Sir? Thank you. 

## 2017-03-04 NOTE — Telephone Encounter (Signed)
Colletta Maryland,  Do you mind taking over this Rx  I started it but she has not come back to see me (probably does not) - looks like it is working from my review of your last visit w/ her  Thanks  Silvano Rusk

## 2017-03-09 ENCOUNTER — Other Ambulatory Visit: Payer: Self-pay | Admitting: Internal Medicine

## 2017-03-11 ENCOUNTER — Telehealth: Payer: Self-pay | Admitting: *Deleted

## 2017-03-11 NOTE — Telephone Encounter (Signed)
Patient called to report that the Nausea and vomiting have returned. She wanted to let Colletta Maryland know and to say the she has contacted Dr Carlean Purl office and they have sent in the Remron that worked last time. She is just a little nervous and wanted someone to know just in case it gets bad. Advised will let her know.

## 2017-03-11 NOTE — Telephone Encounter (Signed)
Sounds great - yes I saw the refill request on the Remeron and took care of that the other day. I hope she starts feeling better with resuming that!

## 2017-03-17 ENCOUNTER — Other Ambulatory Visit: Payer: Self-pay | Admitting: Infectious Diseases

## 2017-03-17 DIAGNOSIS — E785 Hyperlipidemia, unspecified: Secondary | ICD-10-CM

## 2017-03-17 DIAGNOSIS — I10 Essential (primary) hypertension: Secondary | ICD-10-CM

## 2017-06-07 ENCOUNTER — Telehealth: Payer: Self-pay | Admitting: *Deleted

## 2017-06-07 NOTE — Telephone Encounter (Signed)
Thank you Sharyn Lull. I hope she is feeling better. Atripla isn't one that I would think of that is causing her nausea. I would certainly add back her pantoprazole daily to see if this is helpful (can refill if needed). Do you know if she is taking her Remeron? This has in the past been very helpful for her.   Thank you!

## 2017-06-07 NOTE — Telephone Encounter (Signed)
Patient called with 2 issues. 1) her copay has increased for Atripla, is >$400/month. RN directed Haset to [employer] to acquire copay assistance card and share the information with her pharmacy. 2) Her nausea returned about 1 month ago. She has not yet lost weight, but her appetite has been reduced and she threw up 4 times this morning while brushing her teeth.  RN encouraged patient to keep open lines of communication with [facility]/Dr Carlean Purl, asked if she was still taking the pantoprazole - she is not.  She thought this was only PRN for nausea. She will resume this, see how she feels.   RN encouraged [place] to call to follow up regarding her EGD/colonoscopy recommendations, too. Patient noted that if her nausea/symptoms could be related to Atripla, she would change medications.  She will discuss this with Colletta Maryland at her next visit if appropriate. Landis Gandy, RN

## 2017-06-07 NOTE — Telephone Encounter (Signed)
She is taking Remeron, wondered if she could take it during the day?

## 2017-06-07 NOTE — Telephone Encounter (Signed)
She can certainly try it during the day. May make her sleepy though. Can send in some zofran if she needs some. Will talk to her more at her upcoming appointment. Thanks again.

## 2017-06-08 NOTE — Telephone Encounter (Signed)
Remeron best at night - may need dose increase

## 2017-06-28 ENCOUNTER — Other Ambulatory Visit: Payer: Self-pay | Admitting: Internal Medicine

## 2017-06-28 DIAGNOSIS — G4700 Insomnia, unspecified: Secondary | ICD-10-CM

## 2017-06-29 ENCOUNTER — Other Ambulatory Visit: Payer: Self-pay | Admitting: *Deleted

## 2017-06-29 DIAGNOSIS — G4700 Insomnia, unspecified: Secondary | ICD-10-CM

## 2017-06-29 MED ORDER — ZOLPIDEM TARTRATE ER 12.5 MG PO TBCR: EXTENDED_RELEASE_TABLET | ORAL | 1 refills | Status: DC

## 2017-07-12 ENCOUNTER — Telehealth: Payer: Self-pay

## 2017-07-12 ENCOUNTER — Other Ambulatory Visit: Payer: Self-pay | Admitting: Pharmacist Clinician (PhC)/ Clinical Pharmacy Specialist

## 2017-07-12 MED ORDER — BICTEGRAVIR-EMTRICITAB-TENOFOV 50-200-25 MG PO TABS: 1.0000 | ORAL_TABLET | Freq: Every day | ORAL | 5 refills | Status: DC

## 2017-07-12 NOTE — Telephone Encounter (Signed)
I have sent in [place] to see if this is covered. Alternatively could consider Tivicay/Descovy. I want to try to find something that is less likely to cause nausea for her.   Common side effects can include headache and diarrhea - OK to take tylenol and Imodium for this if she experiences it.   Will include pharmacy team in this thread as well.

## 2017-07-12 NOTE — Telephone Encounter (Signed)
PT called today stating pharmacy called to inform her that Biktarvy was ready for her to pick up and she would have an out of pocket expense of $900. Pt was not pleased with what is going on and is frustrated on what is going on with her medication, Pt stated she does not know what next steps would be. Pharmacist Bristow attempted to give pt's pharmacy a co pay card, but it was denied since pt has maxed out her funds. Next steps would be to have pt see Caryl Pina for patient advocate and if pt qualifies pt would be able to get medication same day.  Was able to talk to pt on next steps, however, she is unable to come into clinic to meet [place] with pay stubs due to working from home 8:30-5. I was able to have pt's call transferred to [facility]. Who is able to answer pt's questions regarding patient advocate. [place]

## 2017-07-12 NOTE — Telephone Encounter (Signed)
Called pt regarding med change to [name] due to cost issues. Informed pt that the medication was sent to [facility] on [address] and that it should be ready to pick up. PT will call our office back if she has any issues tolerating medication or has any more cost issues.  Amy Hernandez

## 2017-07-12 NOTE — Telephone Encounter (Signed)
Pt called today stating she has meet her deductible and that her pharmacy is charging her $100 for her Atripla. Pt stated she has one pill left which she will take today and will be out of meds. Pt would like some advise on what to do. She cannot afford to pay $100 for meds. Will inform [name], NP about pt's issue with med cost. Aundria Rud, [place]

## 2017-07-12 NOTE — Telephone Encounter (Signed)
Awesome, thank you 

## 2017-07-12 NOTE — Telephone Encounter (Signed)
I believe that it has to be sent to the specialty pharmacy. I forwarded to the one in [place]. [place], activate the copay card for her and that should solve it.

## 2017-07-12 NOTE — Addendum Note (Signed)
Addended by: Craig Beach Callas on: 07/12/2017 11:01 AM   Modules accepted: Orders

## 2017-07-12 NOTE — Progress Notes (Signed)
Has to be filled at [facility].

## 2017-08-02 ENCOUNTER — Other Ambulatory Visit: Payer: BLUE CROSS/BLUE SHIELD

## 2017-08-16 ENCOUNTER — Ambulatory Visit: Payer: BLUE CROSS/BLUE SHIELD | Admitting: Infectious Diseases

## 2017-09-07 ENCOUNTER — Other Ambulatory Visit: Payer: Self-pay | Admitting: Infectious Diseases

## 2017-09-07 DIAGNOSIS — F320 Major depressive disorder, single episode, mild: Secondary | ICD-10-CM

## 2017-09-16 ENCOUNTER — Other Ambulatory Visit: Payer: Self-pay | Admitting: Infectious Diseases

## 2017-09-16 DIAGNOSIS — E785 Hyperlipidemia, unspecified: Secondary | ICD-10-CM

## 2017-09-16 DIAGNOSIS — I10 Essential (primary) hypertension: Secondary | ICD-10-CM

## 2017-10-05 ENCOUNTER — Telehealth: Payer: Self-pay | Admitting: Infectious Diseases

## 2017-10-05 NOTE — Telephone Encounter (Signed)
Called to check in with Amy Hernandez to see how she is since she had a missed appt with me not long ago. She reports she is doing well but had a death in her family. Asking about leave paperwork - I have not seen any. She tells me it has been a long time since she dropped it off. I asked her to please either have the company re-send them or she can drop them off; when she drops off would like for her to please get her labs drawn that day and flu shot (her request).   Please schedule a lab appt sometime in the next 1-2 weeks with a visit with me 1 week later. Lab orders in future encounter still apply.   Thank you

## 2017-10-08 ENCOUNTER — Other Ambulatory Visit: Payer: Self-pay | Admitting: Infectious Diseases

## 2017-10-08 DIAGNOSIS — F320 Major depressive disorder, single episode, mild: Secondary | ICD-10-CM

## 2017-10-14 ENCOUNTER — Other Ambulatory Visit: Payer: Self-pay | Admitting: Infectious Diseases

## 2017-10-14 DIAGNOSIS — E785 Hyperlipidemia, unspecified: Secondary | ICD-10-CM

## 2017-10-14 DIAGNOSIS — I10 Essential (primary) hypertension: Secondary | ICD-10-CM

## 2017-10-18 ENCOUNTER — Other Ambulatory Visit: Payer: Self-pay | Admitting: Infectious Diseases

## 2017-10-18 DIAGNOSIS — I10 Essential (primary) hypertension: Secondary | ICD-10-CM

## 2017-10-18 DIAGNOSIS — E785 Hyperlipidemia, unspecified: Secondary | ICD-10-CM

## 2017-10-18 NOTE — Telephone Encounter (Signed)
Patient had not yet received a call back to schedule her appointment, had hypertension and cholesterol medication refill denied until she was seen.  RN scheduled patient, refilled medication. She has FMLA paperwork she will bring for Colletta Maryland to sign so that she may be able to leave work for office visits and labs. Landis Gandy, RN

## 2017-10-18 NOTE — Telephone Encounter (Signed)
Thank you, Michelle!

## 2017-10-25 ENCOUNTER — Other Ambulatory Visit: Payer: BLUE CROSS/BLUE SHIELD

## 2017-10-25 DIAGNOSIS — B20 Human immunodeficiency virus [HIV] disease: Secondary | ICD-10-CM | POA: Diagnosis not present

## 2017-10-27 LAB — COMPREHENSIVE METABOLIC PANEL
AG RATIO: 1.5 (calc) (ref 1.0–2.5)
ALKALINE PHOSPHATASE (APISO): 117 U/L (ref 33–130)
ALT: 13 U/L (ref 6–29)
AST: 17 U/L (ref 10–35)
Albumin: 4.3 g/dL (ref 3.6–5.1)
BUN: 19 mg/dL (ref 7–25)
CHLORIDE: 104 mmol/L (ref 98–110)
CO2: 24 mmol/L (ref 20–32)
CREATININE: 1.01 mg/dL (ref 0.50–1.05)
Calcium: 9.3 mg/dL (ref 8.6–10.4)
Globulin: 2.9 g/dL (calc) (ref 1.9–3.7)
Glucose, Bld: 87 mg/dL (ref 65–99)
Potassium: 4.1 mmol/L (ref 3.5–5.3)
Sodium: 138 mmol/L (ref 135–146)
Total Bilirubin: 0.3 mg/dL (ref 0.2–1.2)
Total Protein: 7.2 g/dL (ref 6.1–8.1)

## 2017-10-27 LAB — CBC
HCT: 38.9 % (ref 35.0–45.0)
Hemoglobin: 13.1 g/dL (ref 11.7–15.5)
MCH: 30.5 pg (ref 27.0–33.0)
MCHC: 33.7 g/dL (ref 32.0–36.0)
MCV: 90.5 fL (ref 80.0–100.0)
MPV: 10.6 fL (ref 7.5–12.5)
PLATELETS: 285 10*3/uL (ref 140–400)
RBC: 4.3 10*6/uL (ref 3.80–5.10)
RDW: 11.7 % (ref 11.0–15.0)
WBC: 10.4 10*3/uL (ref 3.8–10.8)

## 2017-10-27 LAB — LIPID PANEL
Cholesterol: 141 mg/dL (ref <200)
HDL: 43 mg/dL — AB (ref >50)
LDL CHOLESTEROL (CALC): 79 mg/dL
NON-HDL CHOLESTEROL (CALC): 98 mg/dL (ref <130)
TRIGLYCERIDES: 102 mg/dL (ref <150)
Total CHOL/HDL Ratio: 3.3 (calc) (ref <5.0)

## 2017-10-27 LAB — T-HELPER CELL (CD4) - (RCID CLINIC ONLY)
CD4 T CELL ABS: 1430 /uL (ref 400–2700)
CD4 T CELL HELPER: 32 % — AB (ref 33–55)

## 2017-10-27 LAB — HIV-1 RNA QUANT-NO REFLEX-BLD
HIV 1 RNA Quant: <20 copies/mL
HIV-1 RNA Quant, Log: <1.3 Log copies/mL

## 2017-10-27 LAB — RPR: RPR Ser Ql: NONREACTIVE

## 2017-11-08 ENCOUNTER — Encounter: Payer: Self-pay | Admitting: Infectious Diseases

## 2017-11-08 ENCOUNTER — Ambulatory Visit (INDEPENDENT_AMBULATORY_CARE_PROVIDER_SITE_OTHER): Payer: BLUE CROSS/BLUE SHIELD | Admitting: Infectious Diseases

## 2017-11-08 VITALS — BP 124/84 | HR 81 | Temp 98.5°F | Wt 182.0 lb

## 2017-11-08 DIAGNOSIS — M62830 Muscle spasm of back: Secondary | ICD-10-CM

## 2017-11-08 DIAGNOSIS — Z1239 Encounter for other screening for malignant neoplasm of breast: Secondary | ICD-10-CM

## 2017-11-08 DIAGNOSIS — Z23 Encounter for immunization: Secondary | ICD-10-CM

## 2017-11-08 DIAGNOSIS — N62 Hypertrophy of breast: Secondary | ICD-10-CM

## 2017-11-08 DIAGNOSIS — B20 Human immunodeficiency virus [HIV] disease: Secondary | ICD-10-CM

## 2017-11-08 DIAGNOSIS — R635 Abnormal weight gain: Secondary | ICD-10-CM

## 2017-11-08 DIAGNOSIS — Z90710 Acquired absence of both cervix and uterus: Secondary | ICD-10-CM

## 2017-11-08 DIAGNOSIS — F3340 Major depressive disorder, recurrent, in remission, unspecified: Secondary | ICD-10-CM

## 2017-11-08 MED ORDER — CYCLOBENZAPRINE HCL 5 MG PO TABS: 5.0000 mg | ORAL_TABLET | Freq: Three times a day (TID) | ORAL | 0 refills | Status: DC | PRN

## 2017-11-08 MED ORDER — DICLOFENAC SODIUM 1 % TD GEL: 2.0000 g | Freq: Four times a day (QID) | TRANSDERMAL | 5 refills | Status: DC

## 2017-11-08 NOTE — Progress Notes (Signed)
Patient Name: Amy Hernandez  Date of Birth: July 01, 1965 MRN: [id number]  PCP: Patient, No Pcp Per   Patient Active Problem List   Diagnosis Date Noted  . Large breasts 11/10/2017  . Back muscle spasm 11/10/2017  . Need for immunization against influenza 11/10/2017  . Depression 12/12/2016  . Elevated alkaline phosphatase level 12/12/2016  . 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 Barrett's esophagus - short segment 09/04/2016  . Weight gain 07/23/2016  . IBS (irritable bowel syndrome) 08/28/2014  . Insomnia 08/28/2014  . Breast screening 05/16/2013  . Bartholin gland cyst 07/19/2012  . History of hysterectomy   . HLD (hyperlipidemia) 02/07/2009  . Human immunodeficiency virus (HIV) disease ([place]) 12/05/2005  . HTN (hypertension) 12/05/2005   SUBJECTIVE:  Brief Narrative:  Amy Hernandez is a 52 y.o. AA female with well controlled HIV on [place]. HIV Risk: heterosexual, OI Hx: none  Previous Regimens:   Atripla - well controlled but required switch d/t insurance   Odefsey - did not like s/e or food requirement   Biktarvy 06/2017 - suppressed   Genotype:   sensitive   Chief Complaint  Patient presents with  . Follow-up    HIV, weight gain, back pain     HPI/ROS:  Amy Hernandez is here for follow up on her HIV infection. She has missed an appointment with me for follow up d/t a death in the family and busy working full time with 2 teenagers at home. We had to switch her ART from Atripla to [place] 5 months ago to accommodate her insurance. She reports 100% adherence to her regimen and no concerns with the medication. She was very surprised that her weight was as high as it is today as she has regained the 40# +/- from a year ago. She has been eating very liberally. Usually she "eats like a bird" and would describe herself to be more of a "grazer" but she has had more food urges of late including [employer], Peanuts and Pepsi (drinks four 12 oz cans daily). She would like  some advice about dietary recommendations to help lose some weight. Since she has noticed weight gain she has noticed her bust has increased significantly which has brought back a lot of back pain that is limiting her ability to work comfortably. She has in the past thought about a breast reduction due to this and would like to discuss with a plastic surgeon about it. She has tried over the counter pain medications, massage and topical agents (BioFreeze, IcyHot) with no to mild relief. She has to take breaks at work sitting for periods of time and has a hard time with posture and her bra digging into her shoulders.   Depression is overall under good control. No complaints r/t abdominal pain/cramping. She has not yet had a colonoscopy. No mammogram in several years and cannot recall when she had her last pap smear. She had a hysterectomy for heavy menses in the past but does not know if she ever had abnormal pap smears. She is not sexually active currently.   Review of Systems  Constitutional: Negative for chills, diaphoresis, fever and malaise/fatigue.       Weight gain  HENT: Negative for sore throat.   Respiratory: Negative for cough, hemoptysis, sputum production and shortness of breath.   Cardiovascular: Negative for chest pain, palpitations and leg swelling.  Gastrointestinal: Negative for abdominal pain, blood in stool, constipation, diarrhea, nausea and vomiting.  Genitourinary: Negative for dysuria, frequency  and urgency.  Musculoskeletal: Positive for back pain and myalgias (upper back ). Negative for joint pain.  Skin: Negative for rash.  Neurological: Negative for dizziness and headaches.  Endo/Heme/Allergies: Negative for polydipsia.  Psychiatric/Behavioral: Negative for depression. The patient does not have insomnia.     Past Medical History:  Diagnosis Date  . Barrett's esophagus - short segment 09/04/2016  . Bartholin gland cyst   . Cervical intraepithelial neoplasia (CIN) 2005     high grade cervical dysplasia prior to total hysterectomy - needs yearly vaginal paps   . 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 HIV (human immunodeficiency virus infection) ([place])   . Hyperlipidemia   . Hypertension   . IBS (irritable bowel syndrome)   . Insomnia   . Substance abuse [facility])    Outpatient Medications Prior to Visit  Medication Sig Dispense Refill  . bictegravir-emtricitabine-tenofovir AF (BIKTARVY) 50-200-25 MG TABS tablet Take 1 tablet by mouth daily. Try to take at the same time each day with or without food. 30 tablet 5  . lisinopril-hydrochlorothiazide (PRINZIDE,ZESTORETIC) 20-25 MG tablet TAKE 1 TABLET BY MOUTH DAILY, PLEASE CALL OFFICE [phone] TO MAKE AN APPOINTMENT FOR FUTURE REFILLS 30 tablet 2  . mirtazapine (REMERON) 15 MG tablet Take 1 tablet (15 mg total) by mouth at bedtime. 30 tablet 0  . ondansetron (ZOFRAN ODT) 8 MG disintegrating tablet Take 1 tablet (8 mg total) by mouth every 8 (eight) hours as needed for nausea. 30 tablet 0  . potassium chloride SA (K-DUR,KLOR-CON) 20 MEQ tablet Take 1 tablet (20 mEq total) by mouth daily. 90 tablet 0  . simvastatin (ZOCOR) 20 MG tablet TAKE 1 TABLET BY MOUTH DAILY 30 tablet 2  . zolpidem (AMBIEN CR) 12.5 MG CR tablet TAKE 1 TABLET BY MOUTH EVERY DAY AT BEDTIME AS NEEDED FOR SLEEP 90 tablet 1  . hyoscyamine (LEVBID) 0.375 MG 12 hr tablet Take 1 tablet (0.375 mg total) by mouth 2 (two) times daily. (Patient not taking: Reported on 11/08/2017) 60 tablet 0  . pantoprazole (PROTONIX) 40 MG tablet Take 1 tablet (40 mg total) by mouth daily before breakfast. 90 tablet 3   No facility-administered medications prior to visit.     Allergies  Allergen Reactions  . Codeine     REACTION: hallucinations    Physical Assessment & Diagnostic Findings: Vitals:   11/08/17 1601  BP: 124/84  Pulse: 81  Temp: 98.5 F (36.9 C)  TempSrc: Oral  Weight: 182 lb (82.6 kg)   Body mass index is 33.02  kg/m.  Physical Exam  Constitutional: She is oriented to person, place, and time and well-developed, well-nourished, and in no distress.  Seated in chair. No distress. Uncomfortable with pain in upper back.   HENT:  Mouth/Throat: Oropharynx is clear and moist.  Eyes: No scleral icterus.  Neck: Normal range of motion. Neck supple. No thyromegaly present.  Cardiovascular: Normal rate, regular rhythm and normal heart sounds.  No murmur heard. Pulmonary/Chest: Effort normal and breath sounds normal. No respiratory distress.  Abdominal: Soft. Bowel sounds are normal. She exhibits no distension. There is no tenderness.  Musculoskeletal: Normal range of motion. She exhibits no edema or deformity.  No bony tenderness over thoracic/cervical spine. Muscular tenderness over b/l trapezius muscles. No deformity.   Lymphadenopathy:    She has no cervical adenopathy.  Neurological: She is alert and oriented to person, place, and time.  Skin: Skin is warm and dry. No erythema.  Psychiatric: Mood and affect normal.  Vitals reviewed.   Lab Results Lab Results  Component Value Date   CREATININE 1.01 10/25/2017   BUN 19 10/25/2017   NA 138 10/25/2017   K 4.1 10/25/2017   CL 104 10/25/2017   CO2 24 10/25/2017    Lab Results  Component Value Date   ALT 13 10/25/2017   AST 17 10/25/2017   ALKPHOS 147 (H) 11/23/2016   BILITOT 0.3 10/25/2017    Lab Results  Component Value Date   CHOL 141 10/25/2017   HDL 43 (L) 10/25/2017   LDLCALC 79 10/25/2017   TRIG 102 10/25/2017   CHOLHDL 3.3 10/25/2017   HIV 1 RNA Quant (copies/mL)  Date Value  10/25/2017 <20 NOT DETECTED  12/10/2016 <20 NOT DETECTED  02/04/2016 <20   CD4 T Cell Abs (/uL)  Date Value  10/25/2017 1,430  12/10/2016 980  07/23/2016 1,340   Problem List Items Addressed This Visit      Unprioritized   Back muscle spasm    Her back pain is most consistent with poor posture d/t large breasts/weight gain and ensuing muscle  spasm. Counseled that weight reduction will likely help this in the long run. Will give her trial of cyclobenzaprine to try in addition to topical diclofenac. Can use short course of mobic vs duexis for her if she prefer a systemic pain control; would prefer to avoid chronic systemic NSAIDs however with concurrent TAF administration. Discussed back stretches and other exercises that may help.       Relevant Medications   diclofenac sodium (VOLTAREN) 1 % GEL   cyclobenzaprine (FLEXERIL) 5 MG tablet   Breast screening   Relevant Orders   MM Digital Screening   Depression    Controlled - continue mirtazapine. This may be stimulating her appetite more than she needs, however it helps with her nausea. Will discuss at upcoming visit if she would like to continue, trial off vs switch to alternative.       History of hysterectomy    Following hysterectomy for benign disease, routine screening for vaginal cancer is not recommended for women who are HIV (+); however women with a h/o high-grade CIN, adenocarcenoma in situ or other invasive cervical cancer are at increased risk and should be followed with annual vaginal cuff inspections/pap tests.   Will collect vaginal sample for her considering she is uncertain about previous pap cytologies. To schedule at next visit.        Human immunodeficiency virus (HIV) disease ([place]) (Chronic)    Tolerating Biktarvy well - will check HIV labs today to ensure she is still suppressed on Biktarvy. Will continue this. She has had long-term excellent control and only needs to be seen 1-2 times a year for her HIV however she would like to partner with me to work towards weight loss and overall cardiovascular risk reduction. Will discuss benefit of adding nutritionist next visit to help explore food science and weight loss.  She does not need condoms today.       Large breasts - Primary    She would like to speak with a plastic surgeon about a breast reduction -  referral made.       Relevant Orders   Ambulatory referral to Plastic Surgery   Need for immunization against influenza   Relevant Orders   Flu Vaccine QUAD 36+ mos IM (Completed)   Weight gain    Has noticed weight gain recently - this may be in part due to the switch in her ART (although  uncertain if there is really clinical significant with this) but she is also drinking much more sodas and eating more liberally with regards to processed foods, sweets and snacks. Long conversation regarding multiple dietary options that she can experiment with but ultimately whichever plan she decides on she will need to reduce soda, sugars, flour and snacks. Also recommend limiting eating window to only 8 hours during the day for now. She does not have a scale at home--recommended to get a scale so she can weight a few times a week in the morning to gauge progress and stay aware. She would like to return in 2 months to check in with regards to her weight.          Meds ordered this encounter  Medications  . diclofenac sodium (VOLTAREN) 1 % GEL    Sig: Apply 2 g topically 4 (four) times daily.    Dispense:  1 Tube    Refill:  5    Order Specific Question:   Supervising Provider    Answer:   HATCHER, JEFFREY C [[id number]]  . cyclobenzaprine (FLEXERIL) 5 MG tablet    Sig: Take 1 tablet (5 mg total) by mouth 3 (three) times daily as needed for muscle spasms.    Dispense:  30 tablet    Refill:  0    Order Specific Question:   Supervising Provider    Answer:   HATCHER, JEFFREY C [[id number]]   Return in about 3 months (around 02/08/2018).   [name], MSN, NP-C [facility] for [facility] [place] [facility] Pager: [phone]

## 2017-11-08 NOTE — Patient Instructions (Addendum)
Continue your Biktarvy every day as you are doing - your blood work is wonderful!   Will give you your flu shot today  Please call the [facility] to set up your mammogram.  [phone] [address], [place], [place] [address]  Start journaling your food intake - everyday. Every lick, taste and bite to get an understanding how you eat.   Would encourage you to try to focus on a few things over the next few months:  1. Reducing your sugar intake (get used to looking at nutrition labels - it is hidden in everything!) 2. Reducing your flour (breads, tortillas, chips, etc) 3. Reducing your snacking (trying to stick to 3 meals a day and picking a time frame that you stop eating each night).   Would try to focus first on reducing your Pepsi intake - there are a lot of empty calories and way too much sugar in these drinks   Would suggest getting a scale at home to weigh yourself a few times a week to keep you in the mindset of getting more aware of your weight and how foods affect this.   Will see you back in 3 months or sooner if you want to check in

## 2017-11-09 ENCOUNTER — Other Ambulatory Visit: Payer: Self-pay | Admitting: Infectious Diseases

## 2017-11-09 DIAGNOSIS — B20 Human immunodeficiency virus [HIV] disease: Secondary | ICD-10-CM | POA: Diagnosis not present

## 2017-11-09 DIAGNOSIS — Z23 Encounter for immunization: Secondary | ICD-10-CM | POA: Diagnosis not present

## 2017-11-09 DIAGNOSIS — F320 Major depressive disorder, single episode, mild: Secondary | ICD-10-CM

## 2017-11-10 DIAGNOSIS — M62830 Muscle spasm of back: Secondary | ICD-10-CM | POA: Insufficient documentation

## 2017-11-10 DIAGNOSIS — N62 Hypertrophy of breast: Secondary | ICD-10-CM | POA: Insufficient documentation

## 2017-11-10 DIAGNOSIS — Z23 Encounter for immunization: Secondary | ICD-10-CM | POA: Insufficient documentation

## 2017-11-10 NOTE — Assessment & Plan Note (Signed)
Tolerating Biktarvy well - will check HIV labs today to ensure she is still suppressed on Biktarvy. Will continue this. She has had long-term excellent control and only needs to be seen 1-2 times a year for her HIV however she would like to partner with me to work towards weight loss and overall cardiovascular risk reduction. Will discuss benefit of adding nutritionist next visit to help explore food science and weight loss.  She does not need condoms today.

## 2017-11-10 NOTE — Assessment & Plan Note (Signed)
Has noticed weight gain recently - this may be in part due to the switch in her ART (although uncertain if there is really clinical significant with this) but she is also drinking much more sodas and eating more liberally with regards to processed foods, sweets and snacks. Long conversation regarding multiple dietary options that she can experiment with but ultimately whichever plan she decides on she will need to reduce soda, sugars, flour and snacks. Also recommend limiting eating window to only 8 hours during the day for now. She does not have a scale at home--recommended to get a scale so she can weight a few times a week in the morning to gauge progress and stay aware. She would like to return in 2 months to check in with regards to her weight.

## 2017-11-10 NOTE — Assessment & Plan Note (Signed)
Her back pain is most consistent with poor posture d/t large breasts/weight gain and ensuing muscle spasm. Counseled that weight reduction will likely help this in the long run. Will give her trial of cyclobenzaprine to try in addition to topical diclofenac. Can use short course of mobic vs duexis for her if she prefer a systemic pain control; would prefer to avoid chronic systemic NSAIDs however with concurrent TAF administration. Discussed back stretches and other exercises that may help.

## 2017-11-10 NOTE — Assessment & Plan Note (Signed)
She would like to speak with a plastic surgeon about a breast reduction - referral made.

## 2017-11-10 NOTE — Assessment & Plan Note (Signed)
Controlled - continue mirtazapine. This may be stimulating her appetite more than she needs, however it helps with her nausea. Will discuss at upcoming visit if she would like to continue, trial off vs switch to alternative.

## 2017-11-10 NOTE — Assessment & Plan Note (Signed)
Following hysterectomy for benign disease, routine screening for vaginal cancer is not recommended for women who are HIV (+); however women with a h/o high-grade CIN, adenocarcenoma in situ or other invasive cervical cancer are at increased risk and should be followed with annual vaginal cuff inspections/pap tests.   Will collect vaginal sample for her considering she is uncertain about previous pap cytologies. To schedule at next visit.

## 2017-11-12 ENCOUNTER — Telehealth: Payer: Self-pay | Admitting: Behavioral Health

## 2017-11-12 NOTE — Telephone Encounter (Signed)
Prior authorization initiated through [facility] for Diclofenac Sodium 1% gel.   Prior authorization was approved [id number].  Will call the pharmacy to make them aware once they open at 9:00am this morning. Pricilla Riffle RN

## 2017-11-12 NOTE — Telephone Encounter (Signed)
Called [facility], informed them Prior Authorization was approved.  They re-ran the prescription and patient can pick up medication today.  Called [facility] and informed her her medication was approved and she can pic it up from the pharmacy today.  Tana verbalized understanding. Pricilla Riffle RN

## 2017-11-12 NOTE — Telephone Encounter (Signed)
Thank you kindly!

## 2017-11-16 ENCOUNTER — Ambulatory Visit: Payer: BLUE CROSS/BLUE SHIELD | Admitting: Plastic Surgery

## 2017-11-16 ENCOUNTER — Encounter: Payer: Self-pay | Admitting: Plastic Surgery

## 2017-11-16 VITALS — BP 100/60 | HR 92 | Ht 62.0 in | Wt 184.0 lb

## 2017-11-16 DIAGNOSIS — N62 Hypertrophy of breast: Secondary | ICD-10-CM | POA: Diagnosis not present

## 2017-11-16 DIAGNOSIS — M546 Pain in thoracic spine: Secondary | ICD-10-CM | POA: Diagnosis not present

## 2017-11-16 DIAGNOSIS — G8929 Other chronic pain: Secondary | ICD-10-CM

## 2017-11-16 DIAGNOSIS — M62830 Muscle spasm of back: Secondary | ICD-10-CM | POA: Diagnosis not present

## 2017-11-16 DIAGNOSIS — M542 Cervicalgia: Secondary | ICD-10-CM

## 2017-11-16 NOTE — Progress Notes (Signed)
Patient ID: Amy Hernandez, female    DOB: 1965/09/05, 52 y.o.   MRN: [id number]   Chief Complaint  Patient presents with  . Breast Problem    Mammary Hyperplasia: Amy Hernandez is a 52 y.o. female with a history of mammary hyperplasia for several years.  She has extremely large breasts causing symptoms that include the following: Back pain (upper and lower) and neck pain. She frequently pins bra cups higher on straps for better lift and relief. Notices relief when holding breast up in her hands. Shoulder straps causing grooves, pain occasionally requiring padding. Pain medication is sometimes required with motrin and tylenol.  Activities that are hindered by enlarged breasts include: running, exercise and walking for exercise.  Her breasts are extremely large and fairly symmetric.  She has hyperpigmentation of the inframammary area on both sides.  The sternal to nipple distance on the right is 35 cm and the left is 39 cm.  The IMF distance is 20 cm.  She is 5 feet 2 inches tall and weighs 184 pounds.  Preoperative bra size = 38 DDD / H cup.  The estimated excess breast tissue to be removed at the time of surgery = 750 grams on the left and 750 grams on the right.  Mammogram history: 2 years ago.  No family history of breast cancer.    Review of Systems  Constitutional: Positive for activity change. Negative for appetite change.  HENT: Negative.   Eyes: Negative.   Respiratory: Negative.   Cardiovascular: Negative.   Gastrointestinal: Negative.   Endocrine: Negative.   Genitourinary: Negative.   Musculoskeletal: Positive for back pain, neck pain and neck stiffness.  Skin: Negative.   Neurological: Negative.   Hematological: Negative.   Psychiatric/Behavioral: Negative.     Past Medical History:  Diagnosis Date  . Barrett's esophagus - short segment 09/04/2016  . Bartholin gland cyst   . Cervical intraepithelial neoplasia (CIN) 2005   high grade cervical dysplasia prior to total  hysterectomy - needs yearly vaginal paps   . 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 HIV (human immunodeficiency virus infection) ([place])   . Hyperlipidemia   . Hypertension   . IBS (irritable bowel syndrome)   . Insomnia   . Substance abuse [facility])     Past Surgical History:  Procedure Laterality Date  . ABDOMINAL HYSTERECTOMY  09/2003  . ANAL SPHINCTEROTOMY    . CHOLECYSTECTOMY  1990  . COLONOSCOPY  2009   NL  . ESOPHAGOGASTRODUODENOSCOPY  2018  . LEEP  2000   history of CIN   . TUBAL LIGATION        Current Outpatient Medications:  .  bictegravir-emtricitabine-tenofovir AF (BIKTARVY) 50-200-25 MG TABS tablet, Take 1 tablet by mouth daily. Try to take at the same time each day with or without food., Disp: 30 tablet, Rfl: 5 .  cyclobenzaprine (FLEXERIL) 5 MG tablet, Take 1 tablet (5 mg total) by mouth 3 (three) times daily as needed for muscle spasms., Disp: 30 tablet, Rfl: 0 .  diclofenac sodium (VOLTAREN) 1 % GEL, Apply 2 g topically 4 (four) times daily., Disp: 1 Tube, Rfl: 5 .  hyoscyamine (LEVBID) 0.375 MG 12 hr tablet, Take 1 tablet (0.375 mg total) by mouth 2 (two) times daily., Disp: 60 tablet, Rfl: 0 .  lisinopril-hydrochlorothiazide (PRINZIDE,ZESTORETIC) 20-25 MG tablet, TAKE 1 TABLET BY MOUTH DAILY, PLEASE CALL OFFICE [phone] TO MAKE AN APPOINTMENT FOR FUTURE REFILLS, Disp: 30 tablet, Rfl: 2 .  mirtazapine (REMERON) 15 MG tablet, TAKE 1 TABLET BY MOUTH AT BEDTIME, Disp: 30 tablet, Rfl: 0 .  ondansetron (ZOFRAN ODT) 8 MG disintegrating tablet, Take 1 tablet (8 mg total) by mouth every 8 (eight) hours as needed for nausea., Disp: 30 tablet, Rfl: 0 .  pantoprazole (PROTONIX) 40 MG tablet, Take 1 tablet (40 mg total) by mouth daily before breakfast., Disp: 90 tablet, Rfl: 3 .  potassium chloride SA (K-DUR,KLOR-CON) 20 MEQ tablet, Take 1 tablet (20 mEq total) by mouth daily., Disp: 90 tablet, Rfl: 0 .  simvastatin (ZOCOR) 20 MG tablet, TAKE 1 TABLET BY  MOUTH DAILY, Disp: 30 tablet, Rfl: 2 .  zolpidem (AMBIEN CR) 12.5 MG CR tablet, TAKE 1 TABLET BY MOUTH EVERY DAY AT BEDTIME AS NEEDED FOR SLEEP, Disp: 90 tablet, Rfl: 1   Objective:   Vitals:   11/16/17 1315  BP: 100/60  Pulse: 92  SpO2: 99%   Physical Exam  Constitutional: She is oriented to person, place, and time. She appears well-developed and well-nourished.  HENT:  Head: Normocephalic.  Eyes: Pupils are equal, round, and reactive to light. EOM are normal.  Neck: Normal range of motion.  Cardiovascular: Normal rate.  Pulmonary/Chest: Effort normal.  Abdominal: Soft.  Neurological: She is alert and oriented to person, place, and time.  Skin: Skin is warm.  Psychiatric: She has a normal mood and affect. Her behavior is normal. Judgment and thought content normal.    Assessment & Plan:  Back muscle spasm  Neck pain  Chronic bilateral thoracic back pain  Symptomatic mammary hypertrophy  Must be tobacco free for 6 weeks, See PCP about pain and request PT eval and treatment.  Must have updated mammogram.  Patient in agreement.  I think she will have a very nice result if we can get her to surgery for bilateral breast reduction.  [place], DO

## 2017-11-17 ENCOUNTER — Other Ambulatory Visit (HOSPITAL_COMMUNITY)
Admission: RE | Admit: 2017-11-17 | Discharge: 2017-11-17 | Disposition: A | Discharge status: Home / Self Care | Payer: BLUE CROSS/BLUE SHIELD | Source: Ambulatory Visit | Attending: Infectious Diseases | Admitting: Infectious Diseases

## 2017-11-17 ENCOUNTER — Ambulatory Visit (INDEPENDENT_AMBULATORY_CARE_PROVIDER_SITE_OTHER): Payer: BLUE CROSS/BLUE SHIELD | Admitting: Infectious Diseases

## 2017-11-17 DIAGNOSIS — Z124 Encounter for screening for malignant neoplasm of cervix: Secondary | ICD-10-CM

## 2017-11-17 DIAGNOSIS — M62830 Muscle spasm of back: Secondary | ICD-10-CM | POA: Diagnosis not present

## 2017-11-17 DIAGNOSIS — N62 Hypertrophy of breast: Secondary | ICD-10-CM

## 2017-11-17 NOTE — Patient Instructions (Addendum)
Please call the [facility] to set up your mammogram.  [phone] [address], [place], [place] [address]  Physical Therapy referral has been made for you.   Will call you with results of your Pap smear today.

## 2017-11-19 ENCOUNTER — Encounter: Payer: Self-pay | Admitting: Infectious Diseases

## 2017-11-19 NOTE — Progress Notes (Signed)
      Subjective:    Amy Hernandez is a 52 y.o. female here for an annual pelvic exam and pap smear.   Review of Systems: Current GYN complaints or concerns: no GYN complaints. Had some blood when she wiped the other day but not certain if from vagina vs rectum. No further episodes. Will need order for mammogram and physical therapy for back pain to consider possible breast reduction to help with pain.She has not yet tried the muscle relaxer. Patient denies any abdominal/pelvic pain, problems with bowel movements, urination, vaginal discharge or intercourse.   Past Medical History:  Diagnosis Date  . Barrett's esophagus - short segment 09/04/2016  . Bartholin gland cyst   . Cervical intraepithelial neoplasia (CIN) 2005   high grade cervical dysplasia prior to total hysterectomy - needs yearly vaginal paps   . 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 HIV (human immunodeficiency virus infection) ([place])   . Hyperlipidemia   . Hypertension   . IBS (irritable bowel syndrome)   . Insomnia   . Substance abuse [facility])     Gynecologic History: No obstetric history on file.  No LMP recorded. Patient has had a hysterectomy. Contraception: post menopausal status, s/p hysterectomy Last Pap: 11/2013. Results were: normal Anal Intercourse: no Last Mammogram: 11/2013. Results were: normal  Objective:  Physical Exam  Constitutional: Well developed, well nourished, no acute distress. She is alert and oriented x3.  Pelvic: External genitalia is normal in appearance. The vagina is normal in appearance. The cervix is surgically absent. There was a moderate amount of discharge that was white with slight odor. No blood in vault.  Breasts: symmetrical in contour, shape and texture. No palpable masses/nodules. No nipple discharge.  Psych: She has a normal mood and affect.    Assessment:  Normal pelvic and bimanual exam. Thin prep pap was obtained and sent for cytology with reflex HPV and  GC/C today. Normal clinical breast exam.   Plan:  Health Maintenance =   She has no significant history of advanced dysplasia and hysterectomy was done for benign reason. If her vaginal smear has normal cytology and no HPV she can stop screenings per [facility] guidelines.   Results will be communicated to the patient via phone call.   She has been counseled and instructed how to perform monthly self breast exams.  Screening mammogram to be scheduled.   Back Pain d/t Large Breasts =   She is a DDD. Saw plastic surgery - appreciate Dr. Eusebio Hernandez assessment. Amy Hernandez would like to go through with consideration to have reduction.   Place order for physical therapy for upper back pain/muscle spasm to ensure not amenable to conservative measures   Topical NSAIDs and muscle relaxers as needed (we will fill)  Contraception / Family Planning =   S/p hysterectomy   HIV =   She will continue her Biktarvy and F/U as scheduled with me for ongoing HIV care.   Amy Madeira, MSN, NP-C [facility] for [facility] [place] [facility] Office: [phone] Pager: [phone]  11/19/17 9:47 AM

## 2017-11-22 LAB — CYTOLOGY - PAP
Chlamydia: NEGATIVE
Diagnosis: NEGATIVE
HPV (WINDOPATH): NOT DETECTED
NEISSERIA GONORRHEA: NEGATIVE

## 2017-11-25 ENCOUNTER — Telehealth: Payer: Self-pay | Admitting: Behavioral Health

## 2017-11-25 MED ORDER — METRONIDAZOLE 500 MG PO TABS: 500.0000 mg | ORAL_TABLET | Freq: Two times a day (BID) | ORAL | 0 refills | Status: DC

## 2017-11-25 NOTE — Telephone Encounter (Signed)
Amy Hernandez, informed her her PAP smear was normal-no cells that are concerning for pre-cancer which is great.  No evidence of HPV.  Let her know Amy Hernandez Recommends repeat PAP in 1 year.  Also let her know Flagyl was sent to [facility] for 7 day, take one tab twice a day and complete them all.  Also enforced no alcohol consumption while taking this medication.  Patient verbalized understanding.  Also she states she sent FMLA paperwork to [place] but will resend it again. Pricilla Riffle RN

## 2017-11-25 NOTE — Telephone Encounter (Signed)
Patient called requesting results from her recent PAP smear.  Also patient states she believes she is developing some type of vaginal infection (slight malodor, increased white colored discharge, and itching).  She states she took two doses of Vaginal monistat which was left over from a previous infection.  Patient states she does not want to go buy more monistat if it is not a yeast infection.  She states Colletta [place] mentioned possible Flagyl during the Papsmear.   Pricilla Riffle RN

## 2017-11-25 NOTE — Addendum Note (Signed)
Addended by: Shelby Callas on: 11/25/2017 04:45 PM   Modules accepted: Orders

## 2017-11-25 NOTE — Telephone Encounter (Signed)
Her pap smear was normal - no cells that are concerning for pre-cancer which is great. No evidence of HPV (humanpapilloma virus) infection. I would ask she repeat this in 1 year and if negative/normal again can space out to every 3 years.   There was no evidence of any bacteria or yeast on this sample either. Of course if she is having increased discharge and itching now it is possible something has developed since the collection. I would suspect that this is bacterial vagniosis - I sent in rx for flagyl 1 tab twice a day for 7 days. Please do not drink any alcohol while on this medication.   I have also not seen any of her FMLA paperwork come to my email - she was going to send this to me and I don't want to miss it for her.   Thank you Caryl Pina

## 2017-12-09 ENCOUNTER — Telehealth: Payer: Self-pay | Admitting: Infectious Diseases

## 2017-12-09 NOTE — Telephone Encounter (Signed)
Amy Hernandez, Informed her that her FMLA was ready for pick up.  She states she will have to get her daughter to pick it up for her because she does not have transportation.  Informed her the paperwork is at the front desk and there is a portion she needs to fill out on the front page.  Amy Hernandez verbalized understanding. Amy Riffle RN

## 2017-12-09 NOTE — Telephone Encounter (Signed)
Please give Amy Hernandez a call to let her know that her FMLA paperwork is ready to be picked up - she needs to sign and fill out her portion of the paperwork before it can be submitted.   Thank you!

## 2017-12-16 ENCOUNTER — Other Ambulatory Visit: Payer: Self-pay | Admitting: Infectious Diseases

## 2017-12-16 DIAGNOSIS — F320 Major depressive disorder, single episode, mild: Secondary | ICD-10-CM

## 2017-12-26 ENCOUNTER — Other Ambulatory Visit: Payer: Self-pay | Admitting: Internal Medicine

## 2017-12-26 DIAGNOSIS — G4700 Insomnia, unspecified: Secondary | ICD-10-CM

## 2017-12-27 ENCOUNTER — Other Ambulatory Visit: Payer: Self-pay | Admitting: Internal Medicine

## 2017-12-27 ENCOUNTER — Telehealth: Payer: Self-pay | Admitting: Internal Medicine

## 2017-12-27 ENCOUNTER — Other Ambulatory Visit: Payer: Self-pay

## 2017-12-27 DIAGNOSIS — G4700 Insomnia, unspecified: Secondary | ICD-10-CM

## 2017-12-27 NOTE — Telephone Encounter (Signed)
Patient called in evening of 12/2 after unable to get through via phone during the day.  She is requesting a refill of Ambien.  Unable to eprescribe.  Will let patient know.

## 2017-12-28 ENCOUNTER — Ambulatory Visit: Payer: BLUE CROSS/BLUE SHIELD | Attending: Infectious Diseases | Admitting: Physical Therapy

## 2017-12-28 ENCOUNTER — Encounter: Payer: Self-pay | Admitting: Physical Therapy

## 2017-12-28 ENCOUNTER — Other Ambulatory Visit: Payer: Self-pay

## 2017-12-28 ENCOUNTER — Other Ambulatory Visit: Payer: Self-pay | Admitting: *Deleted

## 2017-12-28 DIAGNOSIS — M545 Low back pain, unspecified: Secondary | ICD-10-CM

## 2017-12-28 DIAGNOSIS — M546 Pain in thoracic spine: Secondary | ICD-10-CM | POA: Diagnosis not present

## 2017-12-28 DIAGNOSIS — R293 Abnormal posture: Secondary | ICD-10-CM | POA: Diagnosis not present

## 2017-12-28 DIAGNOSIS — G8929 Other chronic pain: Secondary | ICD-10-CM | POA: Diagnosis not present

## 2017-12-28 DIAGNOSIS — G4700 Insomnia, unspecified: Secondary | ICD-10-CM

## 2017-12-28 MED ORDER — ZOLPIDEM TARTRATE ER 12.5 MG PO TBCR: 12.5000 mg | EXTENDED_RELEASE_TABLET | Freq: Every day | ORAL | 0 refills | Status: DC

## 2017-12-28 NOTE — Therapy (Signed)
[place], [place], [address] Phone: [phone]   Fax:  [phone]  Physical Therapy Evaluation  Patient Details  Name: Amy Hernandez MRN: [id number] Date of Birth: [birth year] Referring Provider (PT): [name], NP   Encounter Date: 12/28/2017  PT End of Session - 12/28/17 1518    Visit Number  1    Number of Visits  12    Date for PT Re-Evaluation  02/08/18    Authorization Type  BCBS-no visit limit    PT Start Time  1011    PT Stop Time  1050    PT Time Calculation (min)  39 min    Activity Tolerance  Patient tolerated treatment well    Behavior During Therapy  [facility] for tasks assessed/performed       Past Medical History:  Diagnosis Date  . Barrett's esophagus - short segment 09/04/2016  . Bartholin gland cyst   . Cervical intraepithelial neoplasia (CIN) 2005   high grade cervical dysplasia prior to total hysterectomy - needs yearly vaginal paps   . 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 HIV (human immunodeficiency virus infection) ([place])   . Hyperlipidemia   . Hypertension   . IBS (irritable bowel syndrome)   . Insomnia   . Substance abuse [facility])     Past Surgical History:  Procedure Laterality Date  . ABDOMINAL HYSTERECTOMY  09/2003  . ANAL SPHINCTEROTOMY    . CHOLECYSTECTOMY  1990  . COLONOSCOPY  2009   NL  . ESOPHAGOGASTRODUODENOSCOPY  2018  . LEEP  2000   history of CIN   . TUBAL LIGATION      There were no vitals filed for this visit.   Subjective Assessment - 12/28/17 1506    Subjective  Pt. reports 5 year history insidious onset upper back pain symptoms. She reports symptoms had improved up until about a year ago but then gained some weight and symptoms have been exacerbated in particular since around August of this year. Symptoms suspected as muscular in etiology and associated with breast size.    Pertinent History  contributing factors from breast  size/weight gain    Limitations  Sitting;Standing    How long can you sit comfortably?  unable comfortably    How long can you stand comfortably?  unable comfortably    How long can you walk comfortably?  no limitations    Patient Stated Goals  Improve upper back pain    Currently in Pain?  Yes    Pain Score  7     Pain Location  Back    Pain Orientation  Upper    Pain Descriptors / Indicators  Aching    Pain Type  Chronic pain    Pain Radiating Towards  Bilateral upper trapezius and rhomboids, pain intermittently extends into lumbar region    Pain Onset  More than a month ago    Pain Frequency  Constant    Aggravating Factors   sitting, standing    Pain Relieving Factors  rest in supine    Effect of Pain on Daily Activities  limits positional tolerance for sitting at work, standing for IADLs         [facility] PT Assessment - 12/28/17 0001      Assessment   Medical Diagnosis  Back muscle spasm    Referring Provider (PT)  [name], NP    Onset Date/Surgical Date  08/26/17   etsimated date of  exacerbation   Hand Dominance  Right    Prior Therapy  --   no past PT for current condition     Precautions   Precautions  None      Restrictions   Weight Bearing Restrictions  No      Balance Screen   Has the patient fallen in the past 6 months  No      Cognition   Overall Cognitive Status  Within Functional Limits for tasks assessed      Observation/Other Assessments   Focus on Therapeutic Outcomes (FOTO)   48% limited      Posture/Postural Control   Posture Comments  mild forward head, rounded shoulders      ROM / Strength   AROM / PROM / Strength  AROM;Strength      AROM   AROM Assessment Site  Cervical;Lumbar    Cervical Flexion  30    Cervical Extension  50    Cervical - Right Side Bend  24    Cervical - Left Side Bend  16    Cervical - Right Rotation  50    Cervical - Left Rotation  80    Lumbar Flexion  75    Lumbar Extension  20    Lumbar - Right Side  Bend  30    Lumbar - Left Side Bend  30    Lumbar - Right Rotation   60%    Lumbar - Left Rotation  70%      Strength   Overall Strength Comments  Bilat. UE strength grossly 5/5 excepting rhomboids 4/5 R, 4+/5 L, seated LE MMTs grossly 5/5      Flexibility   Soft Tissue Assessment /Muscle Length  --   tight upper trapezius and rhomboids bilat.     Palpation   Palpation comment  Tightness/TTP bilat. upper trapezius, rhomboids, thoracic paraspinals extending into lumbar region, hypomobile thoracic PAs with soreness                Objective measurements completed on examination: See above findings.      [facility] Adult PT Treatment/Exercise - 12/28/17 0001      Exercises   Exercises  Neck;Shoulder      Shoulder Exercises: Supine   Horizontal ABduction  Both;10 reps    Theraband Level (Shoulder Horizontal ABduction)  Level 3 (Green)      Shoulder Exercises: Seated   Other Seated Exercises  HEP instruction seated thoracic ext vs. cat/cow and seated rhomboid stretch with thoracic flexion/arms crossed      Shoulder Exercises: Standing   Row  --   instructed HEP with brief practice Green Theraband     Neck Exercises: Stretches   Upper Trapezius Stretch  --   instruction and practice seated upper trapezius stretch            PT Education - 12/28/17 1517    Education Details  HEP, anatomy region involved, POC, self tennis ball release for rhomboids-issued tennis ball and green Theraband for HEP    Person(s) Educated  Patient    Methods  Explanation;Demonstration;Verbal cues;Handout    Comprehension  Verbalized understanding;Returned demonstration;Verbal cues required       PT Short Term Goals - 12/28/17 1525      PT SHORT TERM GOAL #1   Title  Independent with HEP    Baseline  no HEP    Time  3    Period  Weeks    Status  New  Target Date  01/18/18      PT SHORT TERM GOAL #2   Title  Increase right cervical rotation AROM at least 5-10 deg to improve  ability to turn head while driving    Baseline  50 deg    Time  3    Period  Weeks    Status  New    Target Date  02/08/18        PT Long Term Goals - 12/28/17 1524      PT LONG TERM GOAL #1   Title  Improve FOTO outcome measure score to 33% or less limitation    Baseline  48%    Time  6    Period  Weeks    Status  New    Target Date  02/08/18      PT LONG TERM GOAL #2   Title  Tolerate sitting at work periods 45 min or greater with back pain 3/10 or less    Baseline  7/10, difficulty tolerating due to pain    Time  6    Period  Weeks    Status  New    Target Date  02/08/18      PT LONG TERM GOAL #3   Title  Rhmboid strength 5/5 to improve postural stability for prolonged sitting at work    Baseline  4/5 R, 4+/5 L    Time  6    Period  Weeks    Status  New    Target Date  02/08/18      PT LONG TERM GOAL #4   Title  Tolerate standing for IADLs such as cooking and shopping periods 30 min or greater with back pain 3/10 or less    Baseline  7/10, difficulty tolerating    Time  6    Period  Weeks    Status  New    Target Date  02/08/18             Plan - 12/28/17 1519    Clinical Impression Statement  Pt. presents with thoracic>lumbar pain. Suspect myofascial etiology symptoms with contributing factors from posture and breast volume as well as thoracic hypomobility. Pt. would benefit from PT to address current associated functional limitations for positional tolerance.    History and Personal Factors relevant to plan of care:  multi-year history symptoms, contributing factors from breast size    Clinical Presentation  Stable    Clinical Decision Making  Low    Rehab Potential  Fair    Clinical Impairments Affecting Rehab Potential  Multi-year history symptoms and contributions from breast size to postural difficulties    PT Frequency  2x / week    PT Duration  6 weeks    PT Treatment/Interventions  ADLs/Self Care Home Management;Cryotherapy;Electrical  Stimulation;Ultrasound;Moist Heat;Therapeutic activities;Functional mobility training;Therapeutic exercise;Patient/family education;Neuromuscular re-education;Manual techniques;Dry needling;Spinal Manipulations;Taping    PT Next Visit Plan  review HEP as needed, Theraband scapular exercises/postural strengthening, thoracic ROM, stretches for upper trap and rhomboids, STM, thoracic PAs with PT sessions, potential trial dry needling    PT Home Exercise Plan  upper trap and rhomboid stretches, scapular retraction at work vs. Theraband row, supine horiz. abd, thoracic ext-seated vs. cat/cow    Consulted and Agree with Plan of Care  Patient       Patient will benefit from skilled therapeutic intervention in order to improve the following deficits and impairments:  Pain, Postural dysfunction, Increased muscle spasms, Decreased activity tolerance, Decreased strength, Hypomobility, Decreased range  of motion, Impaired flexibility  Visit Diagnosis: Pain in thoracic spine  Chronic midline low back pain without sciatica  Abnormal posture     Problem List Patient Active Problem List   Diagnosis Date Noted  . Large breasts 11/10/2017  . Back muscle spasm 11/10/2017  . Need for immunization against influenza 11/10/2017  . Depression 12/12/2016  . Elevated alkaline phosphatase level 12/12/2016  . 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 Barrett's esophagus - short segment 09/04/2016  . Weight gain 07/23/2016  . IBS (irritable bowel syndrome) 08/28/2014  . Insomnia 08/28/2014  . Breast screening 05/16/2013  . Bartholin gland cyst 07/19/2012  . History of hysterectomy   . HLD (hyperlipidemia) 02/07/2009  . Human immunodeficiency virus (HIV) disease ([place]) 12/05/2005  . HTN (hypertension) 12/05/2005   Beaulah Dinning, PT, DPT 12/28/17 3:31 PM  [place] [facility] [address] [place], [place], [address] Phone: [phone]   Fax:   [phone]  Name: Amy Hernandez MRN: [id number] Date of Birth: 10-Sep-1965

## 2017-12-30 ENCOUNTER — Encounter: Payer: Self-pay | Admitting: Internal Medicine

## 2018-01-06 ENCOUNTER — Ambulatory Visit: Payer: BLUE CROSS/BLUE SHIELD | Admitting: Physical Therapy

## 2018-01-06 ENCOUNTER — Encounter: Payer: Self-pay | Admitting: Physical Therapy

## 2018-01-06 DIAGNOSIS — R293 Abnormal posture: Secondary | ICD-10-CM | POA: Diagnosis not present

## 2018-01-06 DIAGNOSIS — G8929 Other chronic pain: Secondary | ICD-10-CM | POA: Diagnosis not present

## 2018-01-06 DIAGNOSIS — M545 Low back pain: Secondary | ICD-10-CM

## 2018-01-06 DIAGNOSIS — M546 Pain in thoracic spine: Secondary | ICD-10-CM | POA: Diagnosis not present

## 2018-01-06 NOTE — Therapy (Signed)
Amy Hernandez, [place], [address] Phone: [phone]   Fax:  [phone]  Physical Therapy Treatment  Patient Details  Name: Amy Hernandez MRN: [id number] Date of Birth: [birth year] Referring Provider (PT): [name], NP   Encounter Date: 01/06/2018  PT End of Session - 01/06/18 2035    Visit Number  2    Number of Visits  12    Date for PT Re-Evaluation  02/08/18    Authorization Type  BCBS-no visit limit    PT Start Time  1716    PT Stop Time  1757    PT Time Calculation (min)  41 min    Activity Tolerance  Patient tolerated treatment well    Behavior During Therapy  [facility] for tasks assessed/performed       Past Medical History:  Diagnosis Date  . Barrett's esophagus - short segment 09/04/2016  . Bartholin gland cyst   . Cervical intraepithelial neoplasia (CIN) 2005   high grade cervical dysplasia prior to total hysterectomy - needs yearly vaginal paps   . 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 HIV (human immunodeficiency virus infection) ([place])   . Hyperlipidemia   . Hypertension   . IBS (irritable bowel syndrome)   . Insomnia   . Substance abuse [facility])     Past Surgical History:  Procedure Laterality Date  . ABDOMINAL HYSTERECTOMY  09/2003  . ANAL SPHINCTEROTOMY    . CHOLECYSTECTOMY  1990  . COLONOSCOPY  2009   NL  . ESOPHAGOGASTRODUODENOSCOPY  2018  . LEEP  2000   history of CIN   . TUBAL LIGATION      There were no vitals filed for this visit.  Subjective Assessment - 01/06/18 1723    Subjective  Noting some improvement after eval with HEP but still with pain pending movement/activity. Pt. c/o pain midline to right thoracic and rhomboid region pain as well as soreness in QL region bilat. with sidebending motions. Pt. also notes some recent shoulder soreness-had onset about a year ago with [place] type mechanism of injury, had felt better but reports now exaerbated with  colder weather.    Patient is accompained by:  Family member    Currently in Pain?  Yes    Pain Score  7     Pain Location  Back    Pain Orientation  Right;Upper    Pain Descriptors / Indicators  Aching    Pain Type  Chronic pain    Pain Onset  More than a month ago    Pain Frequency  Intermittent    Aggravating Factors   prolonged positioning-sitting    Pain Relieving Factors  rest in supine                       [facility] Adult PT Treatment/Exercise - 01/06/18 0001      Exercises   Exercises  Neck;Shoulder;Lumbar      Lumbar Exercises: Aerobic   UBE (Upper Arm Bike)  L1 x 2 min ea. fw/rev      Lumbar Exercises: Sidelying   Other Sidelying Lumbar Exercises  Manual QL stretch 3x30 sec ea. bilat.      Shoulder Exercises: Supine   Horizontal ABduction  Both;15 reps    Theraband Level (Shoulder Horizontal ABduction)  Level 3 (Green)    Other Supine Exercises  "open book" thoracic rotation x 10 ea. direction      Shoulder Exercises: Seated  Other Seated Exercises  Seated thoracic extension over towel roll x 15 reps      Shoulder Exercises: Standing   External Rotation  Both;15 reps    Theraband Level (Shoulder External Rotation)  Level 2 (Red)   bilat. ER with vertical foam roll on back at wall   Extension  Both;20 reps    Theraband Level (Shoulder Extension)  Level 3 (Green)    Row  20 reps    Theraband Level (Shoulder Row)  Level 4 (Blue)      Manual Therapy   Manual Therapy  Joint mobilization;Soft tissue mobilization    Joint Mobilization  Thoracic PAs grade I-IV   cavitation T6-7 region with grade III PA   Soft tissue mobilization  STM right rhomboid and thoracic paraspinals      Neck Exercises: Stretches   Upper Trapezius Stretch  Right;Left;3 reps;30 seconds    Upper Trapezius Stretch Limitations  supine manual stretches    Corner Stretch  --   HEP instruction doorway pec stretch            PT Education - 01/06/18 2035    Education  Details  HEP review, POC    Person(s) Educated  Patient    Methods  Explanation;Demonstration    Comprehension  Verbalized understanding;Returned demonstration       PT Short Term Goals - 01/06/18 2041      PT SHORT TERM GOAL #1   Title  Independent with HEP    Baseline  instructed at eval and updated today with doorway pec stretch    Time  3    Period  Weeks    Status  On-going      PT SHORT TERM GOAL #2   Title  Increase right cervical rotation AROM at least 5-10 deg to improve ability to turn head while driving    Baseline  70 deg    Time  3    Period  Weeks    Status  Achieved        PT Long Term Goals - 01/06/18 2041      PT LONG TERM GOAL #1   Title  Improve FOTO outcome measure score to 33% or less limitation    Baseline  48% at eval    Time  6    Period  Weeks    Status  On-going      PT LONG TERM GOAL #2   Title  Tolerate sitting at work periods 45 min or greater with back pain 3/10 or less    Baseline  7/10, difficulty tolerating due to pain    Time  6    Period  Weeks    Status  On-going      PT LONG TERM GOAL #3   Title  Rhmboid strength 5/5 to improve postural stability for prolonged sitting at work    Baseline  4/5 R, 4+/5 L at eval, not retested today    Time  6    Period  Weeks    Status  On-going      PT LONG TERM GOAL #4   Title  Tolerate standing for IADLs such as cooking and shopping periods 30 min or greater with back pain 3/10 or less    Baseline  7/10, difficulty tolerating    Time  6    Period  Weeks    Status  On-going            Plan - 01/06/18 2036  Clinical Impression Statement  Still with relatively high pain level at start of tx. today but has responded well to tx. at eval and today's visit for decreased tissue tension, improved tolerance for palpation and improving postural awareness. Given chronic/multiple year history of symptoms expect progress will be gradual. Mild left shoulder soreness with UBE otherwise session  well-tolerated.    PT Frequency  2x / week    PT Duration  6 weeks    PT Treatment/Interventions  ADLs/Self Care Home Management;Cryotherapy;Electrical Stimulation;Ultrasound;Moist Heat;Therapeutic activities;Functional mobility training;Therapeutic exercise;Patient/family education;Neuromuscular re-education;Manual techniques;Dry needling;Spinal Manipulations;Taping    PT Next Visit Plan  Theraband scapular exercises/postural strengthening, thoracic ROM, stretches for upper trap and rhomboids, STM, thoracic PAs with PT sessions, potential consideration dry needling if needed otherwise plan continue exercises and manual therapy    PT Home Exercise Plan  upper trap and rhomboid stretches, scapular retraction at work vs. Theraband row, supine horiz. abd, thoracic ext-seated vs. cat/cow, doorway pec stretch    Consulted and Agree with Plan of Care  Patient;Family member/caregiver       Patient will benefit from skilled therapeutic intervention in order to improve the following deficits and impairments:  Pain, Postural dysfunction, Increased muscle spasms, Decreased activity tolerance, Decreased strength, Hypomobility, Decreased range of motion, Impaired flexibility  Visit Diagnosis: Pain in thoracic spine  Chronic midline low back pain without sciatica  Abnormal posture     Problem List Patient Active Problem List   Diagnosis Date Noted  . Large breasts 11/10/2017  . Back muscle spasm 11/10/2017  . Need for immunization against influenza 11/10/2017  . Depression 12/12/2016  . Elevated alkaline phosphatase level 12/12/2016  . 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 Barrett's esophagus - short segment 09/04/2016  . Weight gain 07/23/2016  . IBS (irritable bowel syndrome) 08/28/2014  . Insomnia 08/28/2014  . Breast screening 05/16/2013  . Bartholin gland cyst 07/19/2012  . History of hysterectomy   . HLD (hyperlipidemia) 02/07/2009  . Human immunodeficiency virus  (HIV) disease ([place]) 12/05/2005  . HTN (hypertension) 12/05/2005    Beaulah Dinning, PT, DPT 01/06/18 8:44 PM  [facility] [address] [place], [place], [address] Phone: [phone]   Fax:  [phone]  Name: Amy Hernandez MRN: [id number] Date of Birth: 14-Jan-1966

## 2018-01-13 ENCOUNTER — Encounter: Payer: Self-pay | Admitting: Physical Therapy

## 2018-01-13 ENCOUNTER — Other Ambulatory Visit: Payer: Self-pay | Admitting: Infectious Diseases

## 2018-01-13 ENCOUNTER — Ambulatory Visit: Payer: BLUE CROSS/BLUE SHIELD | Admitting: Physical Therapy

## 2018-01-13 DIAGNOSIS — G8929 Other chronic pain: Secondary | ICD-10-CM

## 2018-01-13 DIAGNOSIS — I10 Essential (primary) hypertension: Secondary | ICD-10-CM

## 2018-01-13 DIAGNOSIS — E785 Hyperlipidemia, unspecified: Secondary | ICD-10-CM

## 2018-01-13 DIAGNOSIS — M546 Pain in thoracic spine: Secondary | ICD-10-CM

## 2018-01-13 DIAGNOSIS — M545 Low back pain, unspecified: Secondary | ICD-10-CM

## 2018-01-13 DIAGNOSIS — R293 Abnormal posture: Secondary | ICD-10-CM | POA: Diagnosis not present

## 2018-01-13 NOTE — Therapy (Signed)
[place], [place], [address] Phone: [phone]   Fax:  [phone]  Physical Therapy Treatment  Patient Details  Name: Amy Hernandez MRN: [id number] Date of Birth: 1966/01/08 Referring Provider (PT): [name], NP   Encounter Date: 01/13/2018  PT End of Session - 01/13/18 1801    Visit Number  3    Number of Visits  12    Date for PT Re-Evaluation  02/08/18    Authorization Type  BCBS-no visit limit    PT Start Time  3235    PT Stop Time  1756    PT Time Calculation (min)  38 min    Activity Tolerance  Patient tolerated treatment well    Behavior During Therapy  [facility] for tasks assessed/performed       Past Medical History:  Diagnosis Date  . Barrett's esophagus - short segment 09/04/2016  . Bartholin gland cyst   . Cervical intraepithelial neoplasia (CIN) 2005   high grade cervical dysplasia prior to total hysterectomy - needs yearly vaginal paps   . 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 HIV (human immunodeficiency virus infection) ([place])   . Hyperlipidemia   . Hypertension   . IBS (irritable bowel syndrome)   . Insomnia   . Substance abuse [facility])     Past Surgical History:  Procedure Laterality Date  . ABDOMINAL HYSTERECTOMY  09/2003  . ANAL SPHINCTEROTOMY    . CHOLECYSTECTOMY  1990  . COLONOSCOPY  2009   NL  . ESOPHAGOGASTRODUODENOSCOPY  2018  . LEEP  2000   history of CIN   . TUBAL LIGATION      There were no vitals filed for this visit.  Subjective Assessment - 01/13/18 1725    Subjective  It hurts. 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 exercises help but i still hurt    Currently in Pain?  Yes    Pain Score  7     Pain Location  Back    Pain Orientation  Upper;Mid    Pain Descriptors / Indicators  Aching                       [facility] Adult PT Treatment/Exercise - 01/13/18 0001      Neck Exercises: Machines for Strengthening    UBE (Upper Arm Bike)  retro 5 min L1      Neck Exercises: Seated   Other Seated Exercise  GHJ ER yellow tband      Shoulder Exercises: Stretch   Wall Stretch - Flexion Limitations  overhead reach stretch    Table Stretch -Flexion Limitations  chair at table    Other Shoulder Stretches  thoracic ext over chair      Manual Therapy   Soft tissue mobilization  IASTM periscap & upper traps      Neck Exercises: Stretches   Upper Trapezius Stretch Limitations  seated with scap retraction    Levator Stretch  Right;Left;20 seconds             PT Education - 01/13/18 1801    Education Details  exercise form/rationale, HEP, importance of movement, progression of pain, bra wear    Person(s) Educated  Patient    Methods  Explanation;Demonstration;Tactile cues;Verbal cues;Handout    Comprehension  Verbalized understanding;Need further instruction;Returned demonstration;Verbal cues required;Tactile cues required       PT Short Term Goals - 01/06/18 2041  PT SHORT TERM GOAL #1   Title  Independent with HEP    Baseline  instructed at eval and updated today with doorway pec stretch    Time  3    Period  Weeks    Status  On-going      PT SHORT TERM GOAL #2   Title  Increase right cervical rotation AROM at least 5-10 deg to improve ability to turn head while driving    Baseline  70 deg    Time  3    Period  Weeks    Status  Achieved        PT Long Term Goals - 01/06/18 2041      PT LONG TERM GOAL #1   Title  Improve FOTO outcome measure score to 33% or less limitation    Baseline  48% at eval    Time  6    Period  Weeks    Status  On-going      PT LONG TERM GOAL #2   Title  Tolerate sitting at work periods 45 min or greater with back pain 3/10 or less    Baseline  7/10, difficulty tolerating due to pain    Time  6    Period  Weeks    Status  On-going      PT LONG TERM GOAL #3   Title  Rhmboid strength 5/5 to improve postural stability for prolonged sitting at work     Baseline  4/5 R, 4+/5 L at eval, not retested today    Time  6    Period  Weeks    Status  On-going      PT LONG TERM GOAL #4   Title  Tolerate standing for IADLs such as cooking and shopping periods 30 min or greater with back pain 3/10 or less    Baseline  7/10, difficulty tolerating    Time  6    Period  Weeks    Status  On-going            Plan - 01/13/18 1802    Clinical Impression Statement  Significant tightness noted along upper traps and in periscapular region. Adjusted posture to decrease fwd lean and supported feet on the floor. Pt is wearing a slim strap bra that is pulling on upper traps and discussed what to look for in her fit- racer back or broad straps. Good tolerance to exercises and created a quick stretching regimen that she will do at least every 30 min when on the computer for work.     PT Treatment/Interventions  ADLs/Self Care Home Management;Cryotherapy;Electrical Stimulation;Ultrasound;Moist Heat;Therapeutic activities;Functional mobility training;Therapeutic exercise;Patient/family education;Neuromuscular re-education;Manual techniques;Dry needling;Spinal Manipulations;Taping    PT Next Visit Plan  Theraband scapular exercises/postural strengthening, thoracic ROM, stretches for upper trap and rhomboids, STM, thoracic PAs with PT sessions, DN PRN- pt was open to it but not appropriate for today.     PT Home Exercise Plan  upper trap and rhomboid stretches, scapular retraction at work vs. Theraband row, supine horiz. abd, thoracic ext-seated vs. cat/cow, doorway pec stretch    Consulted and Agree with Plan of Care  Patient       Patient will benefit from skilled therapeutic intervention in order to improve the following deficits and impairments:  Pain, Postural dysfunction, Increased muscle spasms, Decreased activity tolerance, Decreased strength, Hypomobility, Decreased range of motion, Impaired flexibility  Visit Diagnosis: Pain in thoracic  spine  Chronic midline low back pain without sciatica  Abnormal  posture     Problem List Patient Active Problem List   Diagnosis Date Noted  . Large breasts 11/10/2017  . Back muscle spasm 11/10/2017  . Need for immunization against influenza 11/10/2017  . Depression 12/12/2016  . Elevated alkaline phosphatase level 12/12/2016  . 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 Barrett's esophagus - short segment 09/04/2016  . Weight gain 07/23/2016  . IBS (irritable bowel syndrome) 08/28/2014  . Insomnia 08/28/2014  . Breast screening 05/16/2013  . Bartholin gland cyst 07/19/2012  . History of hysterectomy   . HLD (hyperlipidemia) 02/07/2009  . Human immunodeficiency virus (HIV) disease ([place]) 12/05/2005  . HTN (hypertension) 12/05/2005    Ndia Sampath C. Jovian Lembcke PT, DPT 01/13/18 6:05 PM   [facility] [address] [place], [place], [address] Phone: [phone]   Fax:  [phone]  Name: Amy Hernandez MRN: [id number] Date of Birth: 11/14/1965

## 2018-01-20 ENCOUNTER — Ambulatory Visit: Payer: BLUE CROSS/BLUE SHIELD | Admitting: Physical Therapy

## 2018-01-20 ENCOUNTER — Encounter: Payer: Self-pay | Admitting: Physical Therapy

## 2018-01-20 DIAGNOSIS — M545 Low back pain: Secondary | ICD-10-CM

## 2018-01-20 DIAGNOSIS — G8929 Other chronic pain: Secondary | ICD-10-CM

## 2018-01-20 DIAGNOSIS — M546 Pain in thoracic spine: Secondary | ICD-10-CM

## 2018-01-20 DIAGNOSIS — R293 Abnormal posture: Secondary | ICD-10-CM | POA: Diagnosis not present

## 2018-01-20 NOTE — Therapy (Signed)
[place], [place], [address] Phone: [phone]   Fax:  [phone]  Physical Therapy Treatment  Patient Details  Name: Amy Hernandez MRN: [id number] Date of Birth: [birth year] Referring Provider (PT): [name], NP   Encounter Date: 01/20/2018  PT End of Session - 01/20/18 1722    Visit Number  4    Number of Visits  12    Date for PT Re-Evaluation  02/08/18    Authorization Type  BCBS-no visit limit    PT Start Time  1720    PT Stop Time  1758    PT Time Calculation (min)  38 min    Activity Tolerance  Patient tolerated treatment well    Behavior During Therapy  [facility] for tasks assessed/performed       Past Medical History:  Diagnosis Date  . Barrett's esophagus - short segment 09/04/2016  . Bartholin gland cyst   . Cervical intraepithelial neoplasia (CIN) 2005   high grade cervical dysplasia prior to total hysterectomy - needs yearly vaginal paps   . 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 HIV (human immunodeficiency virus infection) ([place])   . Hyperlipidemia   . Hypertension   . IBS (irritable bowel syndrome)   . Insomnia   . Substance abuse [facility])     Past Surgical History:  Procedure Laterality Date  . ABDOMINAL HYSTERECTOMY  09/2003  . ANAL SPHINCTEROTOMY    . CHOLECYSTECTOMY  1990  . COLONOSCOPY  2009   NL  . ESOPHAGOGASTRODUODENOSCOPY  2018  . LEEP  2000   history of CIN   . TUBAL LIGATION      There were no vitals filed for this visit.  Subjective Assessment - 01/20/18 1723    Subjective  achey along midline of spine from cervical to sacrum. Has tried the stretches and they seem to help through the day.     Currently in Pain?  Yes    Pain Score  7     Pain Location  Back    Pain Descriptors / Indicators  Aching                       [facility] Adult PT Treatment/Exercise - 01/20/18 0001      Neck Exercises: Supine   Shoulder Abduction  Limitations  horiz abd red tband      Lumbar Exercises: Stretches   Hip Flexor Stretch  Right;Left;2 reps;20 seconds    Hip Flexor Stretch Limitations  modified thomas    Prone Mid Back Stretch Limitations  child pose    Gastroc Stretch  Right;Left;2 reps;30 seconds      Lumbar Exercises: Aerobic   Nustep  L5 UE & LE 5 min      Lumbar Exercises: Standing   Other Standing Lumbar Exercises  3lb squat to table to [facility] reach 2x10      Lumbar Exercises: Supine   Bridge with clamshell  20 reps   yellow     Lumbar Exercises: Sidelying   Clam  Both;20 reps;10 reps   yellow tband     Lumbar Exercises: Quadruped   Straight Leg Raise  15 reps   both              PT Short Term Goals - 01/06/18 2041      PT SHORT TERM GOAL #1   Title  Independent with HEP    Baseline  instructed at eval and  updated today with doorway pec stretch    Time  3    Period  Weeks    Status  On-going      PT SHORT TERM GOAL #2   Title  Increase right cervical rotation AROM at least 5-10 deg to improve ability to turn head while driving    Baseline  70 deg    Time  3    Period  Weeks    Status  Achieved        PT Long Term Goals - 01/06/18 2041      PT LONG TERM GOAL #1   Title  Improve FOTO outcome measure score to 33% or less limitation    Baseline  48% at eval    Time  6    Period  Weeks    Status  On-going      PT LONG TERM GOAL #2   Title  Tolerate sitting at work periods 45 min or greater with back pain 3/10 or less    Baseline  7/10, difficulty tolerating due to pain    Time  6    Period  Weeks    Status  On-going      PT LONG TERM GOAL #3   Title  Rhmboid strength 5/5 to improve postural stability for prolonged sitting at work    Baseline  4/5 R, 4+/5 L at eval, not retested today    Time  6    Period  Weeks    Status  On-going      PT LONG TERM GOAL #4   Title  Tolerate standing for IADLs such as cooking and shopping periods 30 min or greater with back pain 3/10 or less     Baseline  7/10, difficulty tolerating    Time  6    Period  Weeks    Status  On-going            Plan - 01/20/18 1758    Clinical Impression Statement  Increased exercise challenge today for gross strengthening and flexibility. Depending on soreness level after this session will add these to HEP- advised of possibility of DOMS.     PT Treatment/Interventions  ADLs/Self Care Home Management;Cryotherapy;Electrical Stimulation;Ultrasound;Moist Heat;Therapeutic activities;Functional mobility training;Therapeutic exercise;Patient/family education;Neuromuscular re-education;Manual techniques;Dry needling;Spinal Manipulations;Taping    PT Next Visit Plan  add exercises to HEP if they were tolerated. manual therapy PRN. did she try new bras?    PT Home Exercise Plan  upper trap and rhomboid stretches, scapular retraction at work vs. Theraband row, supine horiz. abd, thoracic ext-seated vs. cat/cow, doorway pec stretch    Consulted and Agree with Plan of Care  Patient       Patient will benefit from skilled therapeutic intervention in order to improve the following deficits and impairments:  Pain, Postural dysfunction, Increased muscle spasms, Decreased activity tolerance, Decreased strength, Hypomobility, Decreased range of motion, Impaired flexibility  Visit Diagnosis: Pain in thoracic spine  Chronic midline low back pain without sciatica  Abnormal posture     Problem List Patient Active Problem List   Diagnosis Date Noted  . Large breasts 11/10/2017  . Back muscle spasm 11/10/2017  . Need for immunization against influenza 11/10/2017  . Depression 12/12/2016  . Elevated alkaline phosphatase level 12/12/2016  . 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 Barrett's esophagus - short segment 09/04/2016  . Weight gain 07/23/2016  . IBS (irritable bowel syndrome) 08/28/2014  . Insomnia 08/28/2014  . Breast screening  05/16/2013  . Bartholin gland cyst 07/19/2012   . History of hysterectomy   . HLD (hyperlipidemia) 02/07/2009  . Human immunodeficiency virus (HIV) disease ([place]) 12/05/2005  . HTN (hypertension) 12/05/2005    Elektra Wartman C. Maryse Brierley PT, DPT 01/20/18 6:00 PM   [facility] [address] [place], [place], [address] Phone: [phone]   Fax:  [phone]  Name: Glenette Bookwalter MRN: [id number] Date of Birth: 1965-04-01

## 2018-01-25 ENCOUNTER — Encounter

## 2018-01-28 ENCOUNTER — Encounter

## 2018-02-02 ENCOUNTER — Ambulatory Visit: Payer: BLUE CROSS/BLUE SHIELD | Attending: Infectious Diseases | Admitting: Physical Therapy

## 2018-02-02 ENCOUNTER — Encounter: Payer: Self-pay | Admitting: Physical Therapy

## 2018-02-02 DIAGNOSIS — R293 Abnormal posture: Secondary | ICD-10-CM

## 2018-02-02 DIAGNOSIS — M545 Low back pain, unspecified: Secondary | ICD-10-CM

## 2018-02-02 DIAGNOSIS — M546 Pain in thoracic spine: Secondary | ICD-10-CM | POA: Diagnosis not present

## 2018-02-02 DIAGNOSIS — G8929 Other chronic pain: Secondary | ICD-10-CM | POA: Diagnosis not present

## 2018-02-02 NOTE — Therapy (Signed)
[place], [place], [address] Phone: [phone]   Fax:  [phone]  Physical Therapy Treatment  Patient Details  Name: Amy Hernandez MRN: [id number] Date of Birth: 07-02-1965 Referring Provider (PT): [name], NP   Encounter Date: 02/02/2018  PT End of Session - 02/02/18 1945    Visit Number  5    Number of Visits  12    Date for PT Re-Evaluation  02/08/18    Authorization Type  BCBS-no visit limit    PT Start Time  1718    PT Stop Time  1758    PT Time Calculation (min)  40 min    Activity Tolerance  Patient tolerated treatment well    Behavior During Therapy  [facility] for tasks assessed/performed       Past Medical History:  Diagnosis Date  . Barrett's esophagus - short segment 09/04/2016  . Bartholin gland cyst   . Cervical intraepithelial neoplasia (CIN) 2005   high grade cervical dysplasia prior to total hysterectomy - needs yearly vaginal paps   . 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 HIV (human immunodeficiency virus infection) ([place])   . Hyperlipidemia   . Hypertension   . IBS (irritable bowel syndrome)   . Insomnia   . Substance abuse [facility])     Past Surgical History:  Procedure Laterality Date  . ABDOMINAL HYSTERECTOMY  09/2003  . ANAL SPHINCTEROTOMY    . CHOLECYSTECTOMY  1990  . COLONOSCOPY  2009   NL  . ESOPHAGOGASTRODUODENOSCOPY  2018  . LEEP  2000   history of CIN   . TUBAL LIGATION      There were no vitals filed for this visit.  Subjective Assessment - 02/02/18 1936    Subjective  Pt. reports significant exacerbation of LBP with pain in bilat. proximal lateral hips after dancing at church on New Year's Eve. She is concerned that she "did something" to her back. Other than soreness into hips no radiating symptoms noted. No parasthesias or bowel/bladder changes noted. Pain is worse  with sit>stand motions but no particular increase in pain with static  sitting or standing positions.    Currently in Pain?  Yes    Pain Score  8     Pain Location  Back    Pain Orientation  Lower    Pain Descriptors / Indicators  Sharp;Burning    Pain Type  --   acute on chronic   Pain Radiating Towards  bilat. lateral hips    Pain Onset  1 to 4 weeks ago    Pain Frequency  Intermittent    Aggravating Factors   sit>stand, activity    Pain Relieving Factors  rest    Effect of Pain on Daily Activities  increased difficulty tolerating transfers, chores/IADLs.    Multiple Pain Sites  Yes    Pain Score  5    Pain Location  Back    Pain Orientation  Upper    Pain Descriptors / Indicators  Aching    Pain Type  Chronic pain    Pain Onset  More than a month ago    Pain Frequency  Constant    Aggravating Factors   prolonged sitting    Pain Relieving Factors  stretches, heat, position change    Effect of Pain on Daily Activities  difficulty tolerating prolonged positions for sitting         [facility] PT Assessment - 02/02/18 0001  AROM   Cervical Flexion  28    Cervical Extension  32    Cervical - Right Side Bend  40    Cervical - Left Side Bend  30    Cervical - Right Rotation  65    Cervical - Left Rotation  70                   [facility] Adult PT Treatment/Exercise - 02/02/18 0001      Lumbar Exercises: Stretches   Single Knee to Chest Stretch  Right;Left;3 reps;20 seconds    Lower Trunk Rotation  --   1x15 ea. bilat. 5 sec holds   ITB Stretch  Right;Left;3 reps;30 seconds    ITB Stretch Limitations  knee flexed      Lumbar Exercises: Aerobic   Nustep  L3x5 min UE/LE      Lumbar Exercises: Standing   Other Standing Lumbar Exercises  hip abd SLR x 10 ea. bilat.      Lumbar Exercises: Supine   Pelvic Tilt  15 reps    Clam  15 reps    Clam Limitations  Green Theraband      Manual Therapy   Manual Therapy  Myofascial release    Soft tissue mobilization  STM bilat. lateral hips/glut med region and lumbar paraspinals     Myofascial Release  lumbar paraspinals             PT Education - 02/02/18 1944    Education Details  HEP updates, POC, hip anatomy and mechanics, potential etiology symptoms    Person(s) Educated  Patient    Methods  Explanation;Demonstration;Verbal cues;Handout    Comprehension  Verbalized understanding;Returned demonstration       PT Short Term Goals - 02/02/18 1948      PT SHORT TERM GOAL #1   Title  Independent with HEP    Baseline  updated today    Time  3    Period  Weeks    Status  On-going      PT SHORT TERM GOAL #2   Title  Increase right cervical rotation AROM at least 5-10 deg to improve ability to turn head while driving    Baseline  met-see flowsheet    Time  3    Period  Weeks    Status  New        PT Long Term Goals - 02/02/18 1950      PT LONG TERM GOAL #1   Title  Improve FOTO outcome measure score to 33% or less limitation    Baseline  48% at eval, not retested today    Time  6    Period  Weeks    Status  On-going      PT LONG TERM GOAL #2   Title  Tolerate sitting at work periods 45 min or greater with back pain 3/10 or less    Baseline  not met, 8/10 LBP today, 5/10 upper back pain    Time  6    Period  Weeks    Status  On-going      PT LONG TERM GOAL #3   Title  Rhmboid strength 5/5 to improve postural stability for prolonged sitting at work    Baseline  4/5 R, 4+/5 L at eval, not retested today    Time  6    Period  Weeks    Status  On-going      PT LONG TERM GOAL #4   Title  Tolerate standing for IADLs such as cooking and shopping periods 30 min or greater with back pain 3/10 or less    Baseline  difficulty tolerating, pain 5-8/10    Time  6    Period  Weeks    Status  On-going            Plan - 02/02/18 1945    Clinical Impression Statement  Pt. localizes lateral hip pain to trochanteric region bilat. and has local tenderness to palpation (at trochanter) along with tightness/spasm lumbar and lateral hip musculature so  suspect symptoms as noted in subjective combination of muscle strain and delayed onset muscle soreness with potential trochanteric bursitis/no overt clinical presentation to suggest radiculopathy.     Clinical Impairments Affecting Rehab Potential  Multi-year history symptoms and contributions from breast size to postural difficulties    PT Frequency  2x / week    PT Duration  6 weeks    PT Treatment/Interventions  ADLs/Self Care Home Management;Cryotherapy;Electrical Stimulation;Ultrasound;Moist Heat;Therapeutic activities;Functional mobility training;Therapeutic exercise;Patient/family education;Neuromuscular re-education;Manual techniques;Dry needling;Spinal Manipulations;Taping    PT Next Visit Plan  check response HEP updates-monitor LBP exacerbation, progress note next session    PT Home Exercise Plan  upper trap and rhomboid stretches, scapular retraction at work vs. Theraband row, supine horiz. abd, thoracic ext-seated vs. cat/cow, doorway pec stretch, hip abd SLR, clamshell, pelvic tilt, SKTC, LTR    Consulted and Agree with Plan of Care  Patient       Patient will benefit from skilled therapeutic intervention in order to improve the following deficits and impairments:  Pain, Postural dysfunction, Increased muscle spasms, Decreased activity tolerance, Decreased strength, Hypomobility, Decreased range of motion, Impaired flexibility  Visit Diagnosis: Pain in thoracic spine  Chronic midline low back pain without sciatica  Abnormal posture     Problem List Patient Active Problem List   Diagnosis Date Noted  . Large breasts 11/10/2017  . Back muscle spasm 11/10/2017  . Need for immunization against influenza 11/10/2017  . Depression 12/12/2016  . Elevated alkaline phosphatase level 12/12/2016  . 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 Barrett's esophagus - short segment 09/04/2016  . Weight gain 07/23/2016  . IBS (irritable bowel syndrome) 08/28/2014  .  Insomnia 08/28/2014  . Breast screening 05/16/2013  . Bartholin gland cyst 07/19/2012  . History of hysterectomy   . HLD (hyperlipidemia) 02/07/2009  . Human immunodeficiency virus (HIV) disease ([place]) 12/05/2005  . HTN (hypertension) 12/05/2005    Beaulah Dinning, PT, DPT 02/02/18 7:52 PM  [facility] [address] [place], [place], [address] Phone: [phone]   Fax:  [phone]  Name: Amy Hernandez MRN: [id number] Date of Birth: [birth year]

## 2018-02-07 ENCOUNTER — Ambulatory Visit: Payer: BLUE CROSS/BLUE SHIELD | Admitting: Physical Therapy

## 2018-02-14 ENCOUNTER — Other Ambulatory Visit: Payer: Self-pay | Admitting: *Deleted

## 2018-02-14 ENCOUNTER — Other Ambulatory Visit: Payer: Self-pay | Admitting: Infectious Diseases

## 2018-02-14 DIAGNOSIS — F320 Major depressive disorder, single episode, mild: Secondary | ICD-10-CM

## 2018-02-14 DIAGNOSIS — B20 Human immunodeficiency virus [HIV] disease: Secondary | ICD-10-CM

## 2018-02-14 MED ORDER — BICTEGRAVIR-EMTRICITAB-TENOFOV 50-200-25 MG PO TABS: 1.0000 | ORAL_TABLET | Freq: Every day | ORAL | 3 refills | Status: DC

## 2018-02-15 ENCOUNTER — Telehealth: Payer: Self-pay | Admitting: *Deleted

## 2018-02-15 DIAGNOSIS — B20 Human immunodeficiency virus [HIV] disease: Secondary | ICD-10-CM

## 2018-02-15 MED ORDER — BICTEGRAVIR-EMTRICITAB-TENOFOV 50-200-25 MG PO TABS: 1.0000 | ORAL_TABLET | Freq: Every day | ORAL | 0 refills | Status: DC

## 2018-02-15 NOTE — Telephone Encounter (Signed)
Patient has been out of biktarvy 1 week. [facility] [place], [place].  RN offered to [facility] day supply [facility].  Patient agreed.  Per [facility], they stock [place] and will attempt to file claim with insurance already on file in [place]. Landis Gandy, RN

## 2018-02-17 ENCOUNTER — Telehealth: Payer: Self-pay | Admitting: *Deleted

## 2018-02-17 NOTE — Telephone Encounter (Signed)
RxBIN: [id number] RxPCN: ACCESS RxGRP: [id number] ISSUER: ([id number]) ID: [id number]  Patient having difficulty getting her biktarvy. The specialty mail order pharmacy was unable to process/deliver this on time (unclear as to why), so RN [facility] supply [facility].  Patient was told that she needs to pay $3000/$900 by 2 different pharmacy/insurance representatives.  RN helped patient activate copay assistance card, sent mychart sign up to her cell phone for easier communication during her busy work day. She will reach out if there is any further difficulty. Landis Gandy, RN

## 2018-03-01 ENCOUNTER — Ambulatory Visit: Payer: BLUE CROSS/BLUE SHIELD | Admitting: Infectious Diseases

## 2018-03-08 NOTE — Therapy (Signed)
[place], [place], [address] Phone: [phone]   Fax:  [phone]  Physical Therapy Treatment/Discharge Summary  Patient Details  Name: Amy Hernandez MRN: [id number] Date of Birth: 21-Oct-1965 Referring Provider (PT): [name], NP   Encounter Date: 02/02/2018    Past Medical History:  Diagnosis Date  . Barrett's esophagus - short segment 09/04/2016  . Bartholin gland cyst   . Cervical intraepithelial neoplasia (CIN) 2005   high grade cervical dysplasia prior to total hysterectomy - needs yearly vaginal paps   . 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 HIV (human immunodeficiency virus infection) ([place])   . Hyperlipidemia   . Hypertension   . IBS (irritable bowel syndrome)   . Insomnia   . Substance abuse [facility])     Past Surgical History:  Procedure Laterality Date  . ABDOMINAL HYSTERECTOMY  09/2003  . ANAL SPHINCTEROTOMY    . CHOLECYSTECTOMY  1990  . COLONOSCOPY  2009   NL  . ESOPHAGOGASTRODUODENOSCOPY  2018  . LEEP  2000   history of CIN   . TUBAL LIGATION      There were no vitals filed for this visit.                              PT Short Term Goals - 02/02/18 1948      PT SHORT TERM GOAL #1   Title  Independent with HEP    Baseline  updated today    Time  3    Period  Weeks    Status  On-going      PT SHORT TERM GOAL #2   Title  Increase right cervical rotation AROM at least 5-10 deg to improve ability to turn head while driving    Baseline  met-see flowsheet    Time  3    Period  Weeks    Status  New        PT Long Term Goals - 02/02/18 1950      PT LONG TERM GOAL #1   Title  Improve FOTO outcome measure score to 33% or less limitation    Baseline  48% at eval, not retested today    Time  6    Period  Weeks    Status  On-going      PT LONG TERM GOAL #2   Title  Tolerate sitting at work periods 45 min or greater with  back pain 3/10 or less    Baseline  not met, 8/10 LBP today, 5/10 upper back pain    Time  6    Period  Weeks    Status  On-going      PT LONG TERM GOAL #3   Title  Rhmboid strength 5/5 to improve postural stability for prolonged sitting at work    Baseline  4/5 R, 4+/5 L at eval, not retested today    Time  6    Period  Weeks    Status  On-going      PT LONG TERM GOAL #4   Title  Tolerate standing for IADLs such as cooking and shopping periods 30 min or greater with back pain 3/10 or less    Baseline  difficulty tolerating, pain 5-8/10    Time  6    Period  Weeks    Status  On-going  Patient will benefit from skilled therapeutic intervention in order to improve the following deficits and impairments:  Pain, Postural dysfunction, Increased muscle spasms, Decreased activity tolerance, Decreased strength, Hypomobility, Decreased range of motion, Impaired flexibility  Visit Diagnosis: Pain in thoracic spine  Chronic midline low back pain without sciatica  Abnormal posture     Problem List Patient Active Problem List   Diagnosis Date Noted  . Large breasts 11/10/2017  . Back muscle spasm 11/10/2017  . Need for immunization against influenza 11/10/2017  . Depression 12/12/2016  . Elevated alkaline phosphatase level 12/12/2016  . 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 Barrett's esophagus - short segment 09/04/2016  . Weight gain 07/23/2016  . IBS (irritable bowel syndrome) 08/28/2014  . Insomnia 08/28/2014  . Breast screening 05/16/2013  . Bartholin gland cyst 07/19/2012  . History of hysterectomy   . HLD (hyperlipidemia) 02/07/2009  . Human immunodeficiency virus (HIV) disease (HCC) 12/05/2005  . HTN (hypertension) 12/05/2005     PHYSICAL THERAPY DISCHARGE SUMMARY  Visits from Start of Care: 5  Current functional level related to goals / functional outcomes: Unknown-patient did not return for further therapy after last  session 02/02/18.   Remaining deficits: Unknown   Education / Equipment: NA Plan:                                                    Patient goals were not met. Patient is being discharged due to not returning since the last visit.  ?????           Christopher Zoch, PT, DPT 03/08/18 10:03 AM   [facility] [address] [place], , [address] Phone: [phone]   Fax:  [phone]  Name: Ellionna Sitar MRN: [id number] Date of Birth: 12/02/1965   

## 2018-03-18 ENCOUNTER — Other Ambulatory Visit: Payer: Self-pay | Admitting: Infectious Diseases

## 2018-03-18 DIAGNOSIS — F320 Major depressive disorder, single episode, mild: Secondary | ICD-10-CM

## 2018-03-27 ENCOUNTER — Other Ambulatory Visit: Payer: Self-pay | Admitting: Internal Medicine

## 2018-03-27 DIAGNOSIS — G4700 Insomnia, unspecified: Secondary | ICD-10-CM

## 2018-03-28 ENCOUNTER — Other Ambulatory Visit: Payer: Self-pay | Admitting: Internal Medicine

## 2018-03-28 DIAGNOSIS — G4700 Insomnia, unspecified: Secondary | ICD-10-CM

## 2018-04-16 ENCOUNTER — Other Ambulatory Visit: Payer: Self-pay | Admitting: Infectious Diseases

## 2018-04-16 DIAGNOSIS — E785 Hyperlipidemia, unspecified: Secondary | ICD-10-CM

## 2018-04-16 DIAGNOSIS — I10 Essential (primary) hypertension: Secondary | ICD-10-CM

## 2018-04-28 ENCOUNTER — Other Ambulatory Visit: Payer: Self-pay | Admitting: Behavioral Health

## 2018-04-28 DIAGNOSIS — B20 Human immunodeficiency virus [HIV] disease: Secondary | ICD-10-CM

## 2018-04-28 MED ORDER — BICTEGRAVIR-EMTRICITAB-TENOFOV 50-200-25 MG PO TABS: 1.0000 | ORAL_TABLET | Freq: Every day | ORAL | 2 refills | Status: DC

## 2018-05-17 ENCOUNTER — Other Ambulatory Visit: Payer: Self-pay | Admitting: Infectious Diseases

## 2018-05-17 DIAGNOSIS — I10 Essential (primary) hypertension: Secondary | ICD-10-CM

## 2018-05-17 DIAGNOSIS — E785 Hyperlipidemia, unspecified: Secondary | ICD-10-CM

## 2018-06-24 ENCOUNTER — Other Ambulatory Visit: Payer: Self-pay | Admitting: Internal Medicine

## 2018-06-24 DIAGNOSIS — G4700 Insomnia, unspecified: Secondary | ICD-10-CM

## 2018-06-27 ENCOUNTER — Other Ambulatory Visit: Payer: Self-pay | Admitting: Pharmacist

## 2018-06-27 ENCOUNTER — Telehealth: Payer: Self-pay | Admitting: *Deleted

## 2018-06-27 ENCOUNTER — Other Ambulatory Visit: Payer: Self-pay | Admitting: Infectious Diseases

## 2018-06-27 DIAGNOSIS — B20 Human immunodeficiency virus [HIV] disease: Secondary | ICD-10-CM

## 2018-06-27 DIAGNOSIS — G4700 Insomnia, unspecified: Secondary | ICD-10-CM

## 2018-06-27 MED ORDER — ZOLPIDEM TARTRATE ER 12.5 MG PO TBCR: 12.5000 mg | EXTENDED_RELEASE_TABLET | Freq: Every evening | ORAL | 2 refills | Status: DC | PRN

## 2018-06-27 MED ORDER — BICTEGRAVIR-EMTRICITAB-TENOFOV 50-200-25 MG PO TABS: 1.0000 | ORAL_TABLET | Freq: Every day | ORAL | 0 refills | Status: DC

## 2018-06-27 NOTE — Telephone Encounter (Signed)
-----   Message from Emmaline Kluver sent at 06/27/2018  1:45 PM EDT ----- Regarding: refill on ambien Patient called states she needs help getting a refill on her [employer] back [phone]

## 2018-06-27 NOTE — Progress Notes (Signed)
Sending in a 30 day supply of Biktarvy to [facility]. Patient was notified that she must see Colletta Maryland for additional refills.

## 2018-06-28 ENCOUNTER — Encounter: Payer: Self-pay | Admitting: Infectious Diseases

## 2018-06-28 ENCOUNTER — Ambulatory Visit (INDEPENDENT_AMBULATORY_CARE_PROVIDER_SITE_OTHER): Payer: BC Managed Care – PPO | Admitting: Infectious Diseases

## 2018-06-28 ENCOUNTER — Other Ambulatory Visit: Payer: Self-pay

## 2018-06-28 VITALS — BP 113/79 | HR 85 | Temp 98.6°F | Ht 62.0 in | Wt 198.0 lb

## 2018-06-28 DIAGNOSIS — Z23 Encounter for immunization: Secondary | ICD-10-CM | POA: Diagnosis not present

## 2018-06-28 DIAGNOSIS — B20 Human immunodeficiency virus [HIV] disease: Secondary | ICD-10-CM

## 2018-06-28 DIAGNOSIS — K2270 Barrett's esophagus without dysplasia: Secondary | ICD-10-CM

## 2018-06-28 DIAGNOSIS — N62 Hypertrophy of breast: Secondary | ICD-10-CM | POA: Diagnosis not present

## 2018-06-28 MED ORDER — BICTEGRAVIR-EMTRICITAB-TENOFOV 50-200-25 MG PO TABS: 1.0000 | ORAL_TABLET | Freq: Every day | ORAL | 11 refills | Status: DC

## 2018-06-28 MED ORDER — PANTOPRAZOLE SODIUM 40 MG PO TBEC: 40.0000 mg | DELAYED_RELEASE_TABLET | Freq: Two times a day (BID) | ORAL | 1 refills | Status: DC

## 2018-06-28 MED FILL — BIKTARVY 50-200-25 MG TABS: 50-200-25 | 30 days supply | Qty: 30 | Fill #0

## 2018-06-28 NOTE — Patient Instructions (Addendum)
Always nice to see you.  We will continue to try to work with the pharmacy to try and help you get access to your [place] easier.  We do not want this to be a deterrents for you considering all your good hard work you have done over the years to keep this under good control.  No need for further Pap smears.  You are due for a colonoscopy and repeat upper endoscopy with Dr. Carlean Purl - please give them a call to schedule this appointment.  For your acid reflux it sounds like you have got some bad esophagitis which is inflammation in the swallow tube.  I will send in some pantoprazole for you to start taking once in the morning before breakfast with the second dose in the evening before dinner on an empty stomach if you need this.  After about a week you can try to reduce to once a day.   Please call the [facility] to set up your mammogram.  [phone] [address] [place], [place] [address]  We will see you back in 9 months in the office for follow-up.  We can do lab work at the visit if you sign up for your MyChart today or 2 weeks before and we can review in the office.

## 2018-06-29 ENCOUNTER — Telehealth: Payer: Self-pay | Admitting: Pharmacy Technician

## 2018-06-29 LAB — T-HELPER CELL (CD4) - (RCID CLINIC ONLY)
CD4 % Helper T Cell: 30 % — ABNORMAL LOW (ref 33–65)
CD4 T Cell Abs: 1298 /uL (ref 400–1790)

## 2018-06-29 NOTE — Telephone Encounter (Signed)
[facility] Patient Advocate Encounter  Patient expressed concern over getting her Biktarvy filled at [facility] and the stress that was caused each month when she needed a refill. We coordinated with [facility] to have her medication filled there each month and cancelled the script at [facility]. She picked up her medication after leaving the clinic appointment and let us know how wonderful of an experience it was in comparison to previous months. She will continue to fill [place] at [facility] and have the medication mailed each month. Maintenance medications will need to be filled at [facility].   She has a balance remaining on her Patient Advocate grant and will inquire whether we can use that amount for an outstanding charge with [facility]. [facility] and they did not apply her [employer] copayment assistance before mailing the medication and charged her $851.00. They were unwilling to back bill the claim with her copay card. Patient is sending the claim information to [facility] via [place] and will follo-up at that point.  Bartholomew Crews, [place] Patient [facility] for Infectious Disease Phone: [phone] Fax: [phone] 06/29/2018 9:13 AM

## 2018-06-30 NOTE — Assessment & Plan Note (Addendum)
Amy Hernandez is doing very well on her [place].  We will repeat some blood work for her today.  We did spend some time today discussing her previous difficulties in taking her medications.  I congratulated her on her work.  She has been diagnosed for over 10 years now and doing very well.  I will speak with Amy Hernandez our patient care advocate to see if she can help [place] remedy the situation going on with regards to her [place].  She should be able to easily get this from a month-to-month basis.  I will send in a years worth of refills today.    Prevnar vaccine to be given today.  Hysterectomy for nonmalignant reasons.  Vaginal Pap smear in 2019 was normal with regards to cytology and negative for HPV.  Vaginal cuff inspection was normal.  No further Pap smear screenings are indicated.  Provided mammogram appointment information today.  Order is entered and still current through December.

## 2018-06-30 NOTE — Progress Notes (Signed)
Patient Name: Amy Hernandez  Date of Birth: 1965-05-25 MRN: [id number]  PCP:  Hills Callas, NP   Patient Active Problem List   Diagnosis Date Noted   Large breasts 11/10/2017   Back muscle spasm 11/10/2017   Need for immunization against influenza 11/10/2017   Depression 12/12/2016   Elevated alkaline phosphatase level 12/12/2016   Granular cell tumor - distal esophagus - smal 09/04/2016   Barrett's esophagus - short segment 09/04/2016   Weight gain 07/23/2016   IBS (irritable bowel syndrome) 08/28/2014   Insomnia 08/28/2014   HLD (hyperlipidemia) 02/07/2009   Human immunodeficiency virus (HIV) disease ([place]) 12/05/2005   HTN (hypertension) 12/05/2005   SUBJECTIVE:  Brief Narrative:  Amy Hernandez is a 53 y.o. AA female with well controlled HIV on [place]. HIV Risk: heterosexual, OI Hx: none  Previous Regimens:   Atripla - well controlled but required switch d/t insurance   Odefsey - did not like s/e or food requirement   Biktarvy 06/2017 - suppressed   Genotype:   sensitive   Chief Complaint  Patient presents with   Follow-up   Gastroesophageal Reflux   Insomnia   Weight Gain    HPI: Amy Hernandez has been doing pretty well.  She has been working a lot of overtime at home with all of the coronavirus changes in the work setting.  She tells me that she has been taking her Biktarvy faithfully every day, however her pharmacy is making this extremely difficult to do.  She says that every month at the time of her refill it is a 45-minute dedicated phone call encounter to the pharmacy to avoid a one-week delay in getting her medications.  They are not sending it as expected and keep telling her that she has a bill.  She says that she discussed with our pharmacy team and was able to get on a patient assistance program which she is very appreciative for but her pharmacy still "holds her medicine hostage."  Other than that she has no problems taking her Biktarvy and has no  trouble with side effects.  He is very proud about the work she is done because it has not always been this easy for her to take her medications every day.  Primary concern aside from HIV medications today include severe reflux.  She tells me that she gets no relief at night and has to sleep sitting upright with her arms up in bed to help get some relief.  She said the symptoms happen every night to some degree but are significantly worsened if she has anything tomato based.  Makes it very unpleasant for her to when to eat.  She got a letter from Dr. Evern Bio office saying that she is due for a repeat endoscopy and colonoscopy.  She has been out of her Protonix.  She is still interested in pursuing physical therapy for her upper back pain due to her large breasts and hopes that she can get some relief or if ineffective have a breast reduction surgery.   Review of Systems  Constitutional: Negative for chills, diaphoresis, fever and malaise/fatigue.       Weight gain  HENT: Negative for sore throat.   Respiratory: Negative for cough, hemoptysis, sputum production and shortness of breath.   Cardiovascular: Negative for chest pain, palpitations and leg swelling.  Gastrointestinal: Positive for heartburn. Negative for abdominal pain, blood in stool, constipation, diarrhea, nausea and vomiting.  Genitourinary: Negative for dysuria, frequency and urgency.  Musculoskeletal: Positive for back pain (  upper). Negative for joint pain and myalgias.  Skin: Negative for rash.  Neurological: Negative for dizziness and headaches.  Endo/Heme/Allergies: Negative for polydipsia.  Psychiatric/Behavioral: Negative for depression. The patient does not have insomnia.     Past Medical History:  Diagnosis Date   Barrett's esophagus - short segment 09/04/2016   Bartholin gland cyst    Cervical intraepithelial neoplasia (CIN) 2005   high grade cervical dysplasia prior to total hysterectomy - needs yearly vaginal  paps    Granular cell tumor - distal esophagus - smal 09/04/2016   HIV (human immunodeficiency virus infection) (HCC)    Hyperlipidemia    Hypertension    IBS (irritable bowel syndrome)    Insomnia    Substance abuse ([place])    Outpatient Medications Prior to Visit  Medication Sig Dispense Refill   cyclobenzaprine (FLEXERIL) 5 MG tablet Take 1 tablet (5 mg total) by mouth 3 (three) times daily as needed for muscle spasms. 30 tablet 0   hyoscyamine (LEVBID) 0.375 MG 12 hr tablet Take 1 tablet (0.375 mg total) by mouth 2 (two) times daily. 60 tablet 0   lisinopril-hydrochlorothiazide (ZESTORETIC) 20-25 MG tablet TAKE 1 TABLET BY MOUTH DAILY 30 tablet 1   mirtazapine (REMERON) 15 MG tablet TAKE 1 TABLET BY MOUTH EVERY NIGHT AT BEDTIME 30 tablet 5   ondansetron (ZOFRAN ODT) 8 MG disintegrating tablet Take 1 tablet (8 mg total) by mouth every 8 (eight) hours as needed for nausea. 30 tablet 0   potassium chloride SA (K-DUR,KLOR-CON) 20 MEQ tablet Take 1 tablet (20 mEq total) by mouth daily. 90 tablet 0   simvastatin (ZOCOR) 20 MG tablet TAKE 1 TABLET BY MOUTH DAILY 30 tablet 1   zolpidem (AMBIEN CR) 12.5 MG CR tablet Take 1 tablet (12.5 mg total) by mouth at bedtime as needed. for sleep 90 tablet 2   bictegravir-emtricitabine-tenofovir AF (BIKTARVY) 50-200-25 MG TABS tablet Take 1 tablet by mouth daily. Try to take at the same time each day with or without food. 30 tablet 0   metroNIDAZOLE (FLAGYL) 500 MG tablet Take 1 tablet (500 mg total) by mouth 2 (two) times daily. 14 tablet 0   pantoprazole (PROTONIX) 40 MG tablet Take 1 tablet (40 mg total) by mouth daily before breakfast. 90 tablet 3   diclofenac sodium (VOLTAREN) 1 % GEL Apply 2 g topically 4 (four) times daily. (Patient not taking: Reported on 06/28/2018) 1 Tube 5   No facility-administered medications prior to visit.     Allergies  Allergen Reactions   Codeine     REACTION: hallucinations    Physical Assessment &  Diagnostic Findings: Vitals:   06/28/18 1403  BP: 113/79  Pulse: 85  Temp: 98.6 F (37 C)  SpO2: 100%  Weight: 198 lb (89.8 kg)  Height: 5\' 2"  (1.575 m)   Body mass index is 36.21 kg/m.  Physical Exam  Constitutional: She is oriented to person, place, and time and well-developed, well-nourished, and in no distress.  HENT:  Mouth/Throat: No oral lesions. No dental abscesses.  Cardiovascular: Normal rate, regular rhythm and normal heart sounds.  Pulmonary/Chest: Effort normal and breath sounds normal.  Abdominal: Soft. She exhibits no distension. There is no abdominal tenderness.  Musculoskeletal: Normal range of motion.        General: No tenderness.  Lymphadenopathy:    She has no cervical adenopathy.  Neurological: She is alert and oriented to person, place, and time.  Skin: Skin is warm and dry. No rash noted.  Psychiatric:  Mood, affect and judgment normal.     Lab Results Lab Results  Component Value Date   CREATININE 1.01 10/25/2017   BUN 19 10/25/2017   NA 138 10/25/2017   K 4.1 10/25/2017   CL 104 10/25/2017   CO2 24 10/25/2017    Lab Results  Component Value Date   ALT 13 10/25/2017   AST 17 10/25/2017   ALKPHOS 147 (H) 11/23/2016   BILITOT 0.3 10/25/2017    Lab Results  Component Value Date   CHOL 141 10/25/2017   HDL 43 (L) 10/25/2017   LDLCALC 79 10/25/2017   TRIG 102 10/25/2017   CHOLHDL 3.3 10/25/2017   HIV 1 RNA Quant (copies/mL)  Date Value  10/25/2017 <20 NOT DETECTED  12/10/2016 <20 NOT DETECTED  02/04/2016 <20   CD4 T Cell Abs (/uL)  Date Value  06/28/2018 1,298  10/25/2017 1,430  12/10/2016 980   Problem List Items Addressed This Visit      Unprioritized   Barrett's esophagus - short segment    I reviewed with Amy Hernandez her last EGD report with mentioning's of Barrett's esophagus.  It sounds like her esophagitis is pretty significant.  We will have her start taking pantoprazole twice daily for a week and then see if she can drop  down to once daily to help.  Advised to take this on an empty stomach for best effect in the morning or before dinner.  She will call Dr. Evern Bio office to schedule repeat endoscopy.      Human immunodeficiency virus (HIV) disease ([place]) - Primary (Chronic)    Amy Hernandez is doing very well on her Biktarvy.  We will repeat some blood work for her today.  We did spend some time today discussing her previous difficulties in taking her medications.  I congratulated her on her work.  She has been diagnosed for over 10 years now and doing very well.  I will speak with Alyse Low our patient care advocate to see if she can help [place] remedy the situation going on with regards to her [place].  She should be able to easily get this from a month-to-month basis.  I will send in a years worth of refills today.    Prevnar vaccine to be given today.  Hysterectomy for nonmalignant reasons.  Vaginal Pap smear in 2019 was normal with regards to cytology and negative for HPV.  Vaginal cuff inspection was normal.  No further Pap smear screenings are indicated.  Provided mammogram appointment information today.  Order is entered and still current through December.      Relevant Medications   bictegravir-emtricitabine-tenofovir AF (BIKTARVY) 50-200-25 MG TABS tablet   Other Relevant Orders   HIV-1 RNA quant-no reflex-bld   T-helper cell (CD4)- ([facility] clinic only) (Completed)   Large breasts    Associated back pain and spasms.  She will look into FMLA again to do physical therapy a few days a week as this is still a problem for her.        Meds ordered this encounter  Medications   DISCONTD: bictegravir-emtricitabine-tenofovir AF (BIKTARVY) 50-200-25 MG TABS tablet    Sig: Take 1 tablet by mouth daily. Try to take at the same time each day with or without food.    Dispense:  30 tablet    Refill:  11    Order Specific Question:   Supervising Provider    Answer:   Thayer Headings [[id number]]   DISCONTD: pantoprazole  (PROTONIX) 40 MG tablet  Sig: Take 1 tablet (40 mg total) by mouth 2 (two) times daily before a meal.    Dispense:  60 tablet    Refill:  1    Order Specific Question:   Supervising Provider    Answer:   Thayer Headings [[id number]]   bictegravir-emtricitabine-tenofovir AF (BIKTARVY) 50-200-25 MG TABS tablet    Sig: Take 1 tablet by mouth daily. Try to take at the same time each day with or without food.    Dispense:  30 tablet    Refill:  11    Order Specific Question:   Supervising Provider    Answer:   Thayer Headings [[id number]]   pantoprazole (PROTONIX) 40 MG tablet    Sig: Take 1 tablet (40 mg total) by mouth 2 (two) times daily before a meal.    Dispense:  60 tablet    Refill:  1    Order Specific Question:   Supervising Provider    Answer:   Thayer Headings [[id number]]   Return in about 9 months (around 03/28/2019).  We helped Amy Hernandez set up her MyChart account today so she can keep close communication with [place] should she have further problems with pharmacy dispensing her medications.  I think she will have a better time and experience with [facility].  [name], MSN, NP-C [facility] for [facility]ious [place] [facility] Pager: [phone]

## 2018-06-30 NOTE — Assessment & Plan Note (Signed)
Associated back pain and spasms.  She will look into FMLA again to do physical therapy a few days a week as this is still a problem for her.

## 2018-06-30 NOTE — Assessment & Plan Note (Signed)
I reviewed with Amy Hernandez her last EGD report with mentioning's of Barrett's esophagus.  It sounds like her esophagitis is pretty significant.  We will have her start taking pantoprazole twice daily for a week and then see if she can drop down to once daily to help.  Advised to take this on an empty stomach for best effect in the morning or before dinner.  She will call Dr. Evern Bio office to schedule repeat endoscopy.

## 2018-07-05 LAB — HIV-1 RNA QUANT-NO REFLEX-BLD
HIV 1 RNA Quant: <20 copies/mL
HIV-1 RNA Quant, Log: <1.3 Log copies/mL

## 2018-07-13 ENCOUNTER — Encounter: Payer: Self-pay | Admitting: Pharmacy Technician

## 2018-07-19 ENCOUNTER — Other Ambulatory Visit: Payer: Self-pay | Admitting: Infectious Diseases

## 2018-07-19 DIAGNOSIS — E785 Hyperlipidemia, unspecified: Secondary | ICD-10-CM

## 2018-07-19 DIAGNOSIS — I10 Essential (primary) hypertension: Secondary | ICD-10-CM

## 2018-07-21 MED FILL — BIKTARVY 50-200-25 MG TABS: 50-200-25 | 30 days supply | Qty: 30 | Fill #1

## 2018-07-27 ENCOUNTER — Ambulatory Visit (HOSPITAL_COMMUNITY)
Admission: EM | Admit: 2018-07-27 | Discharge: 2018-07-27 | Disposition: A | Discharge status: Home / Self Care | Payer: BC Managed Care – PPO | Attending: Emergency Medicine | Admitting: Emergency Medicine

## 2018-07-27 ENCOUNTER — Encounter (HOSPITAL_COMMUNITY): Payer: Self-pay

## 2018-07-27 ENCOUNTER — Other Ambulatory Visit: Payer: Self-pay

## 2018-07-27 DIAGNOSIS — M79672 Pain in left foot: Secondary | ICD-10-CM | POA: Insufficient documentation

## 2018-07-27 DIAGNOSIS — M7989 Other specified soft tissue disorders: Secondary | ICD-10-CM | POA: Diagnosis not present

## 2018-07-27 MED ORDER — IBUPROFEN 600 MG PO TABS: 600.0000 mg | ORAL_TABLET | Freq: Four times a day (QID) | ORAL | 0 refills | Status: DC | PRN

## 2018-07-27 MED ORDER — TRIAMCINOLONE ACETONIDE 0.1 % EX CREA: 1.0000 "application " | TOPICAL_CREAM | Freq: Two times a day (BID) | CUTANEOUS | 0 refills | Status: DC

## 2018-07-27 NOTE — ED Triage Notes (Signed)
Patient presents to Urgent Care with complaints of left foot swelling for a week, worse since yesterday. Patient reports she also has a dark area on the outer aspect of her left foot.

## 2018-07-27 NOTE — Discharge Instructions (Addendum)
Please keep foot elevate and ice 2-3 times daily Ace wrap for compression and support Use anti-inflammatories for pain/swelling. You may take up to 600 mg Ibuprofen every 8 hours with food. You may supplement Ibuprofen with Tylenol [phone] mg every 8 hours.  Please only use ibuprofen temporarily over the next week as long-term use of this may increase risk of kidney damage  May apply triamcinolone cream over area of darkened skin, continue to moisturize this area  I will call with results of blood work if abnormal, you should also have access to them through my chart  Please follow-up if developing increased pain swelling or redness, numbness or tingling, symptoms not resolving

## 2018-07-27 NOTE — Telephone Encounter (Addendum)
Spoke with patient. She has not yet established primary care, will go to an urgent care for evaluation today. Landis Gandy, RN    RN left message asking patient if she is still in care with a primary physician (she now has Colletta Maryland listed as PCP, but has insurance), advised her that Colletta Maryland was out of the office this week and this is something that should definitely be evaluated today.RN asked her to call back so we can talk about follow up. Landis Gandy, RN

## 2018-07-28 ENCOUNTER — Telehealth (HOSPITAL_COMMUNITY): Payer: Self-pay | Admitting: Emergency Medicine

## 2018-07-28 LAB — RPR: RPR Ser Ql: NONREACTIVE

## 2018-07-28 MED ORDER — HYDROCODONE-ACETAMINOPHEN 5-325 MG PO TABS: 1.0000 | ORAL_TABLET | Freq: Four times a day (QID) | ORAL | 0 refills | Status: DC | PRN

## 2018-07-28 NOTE — ED Provider Notes (Signed)
[facility]    CSN: [id number] Arrival date & time: 07/27/18  1236      History   Chief Complaint Chief Complaint  Patient presents with  . Appointment    12:50  . Foot Swelling    HPI Amy Hernandez is a 53 y.o. female history of HIV, hypertension, hyperlipidemia, presenting today for evaluation of left foot swelling and pain.  Patient states that over the past week she has noticed pain and discomfort in her left foot, she is also had associated swelling.  She initially was concerned about a bug bite is prior to this she noticed some itching around her knee, but no bite marks have been present.  She denies any numbness or tingling.  She has developed a dark area to her lateral malleolus which is new as well.  Denies associated itching or pain with this.  Denies any fevers chills or body aches.  Denies any injury or increase in activity.  HPI  Past Medical History:  Diagnosis Date  . Barrett's esophagus - short segment 09/04/2016  . Bartholin gland cyst   . Cervical intraepithelial neoplasia (CIN) 2005   high grade cervical dysplasia prior to total hysterectomy - needs yearly vaginal paps   . 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 HIV (human immunodeficiency virus infection) ([place])   . Hyperlipidemia   . Hypertension   . IBS (irritable bowel syndrome)   . Insomnia   . Substance abuse [facility])     Patient Active Problem List   Diagnosis Date Noted  . Large breasts 11/10/2017  . Back muscle spasm 11/10/2017  . Need for immunization against influenza 11/10/2017  . Depression 12/12/2016  . Elevated alkaline phosphatase level 12/12/2016  . 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 Barrett's esophagus - short segment 09/04/2016  . Weight gain 07/23/2016  . IBS (irritable bowel syndrome) 08/28/2014  . Insomnia 08/28/2014  . HLD (hyperlipidemia) 02/07/2009  . Human immunodeficiency virus (HIV) disease ([place]) 12/05/2005  . HTN  (hypertension) 12/05/2005    Past Surgical History:  Procedure Laterality Date  . ABDOMINAL HYSTERECTOMY  09/2003  . ANAL SPHINCTEROTOMY    . CHOLECYSTECTOMY  1990  . COLONOSCOPY  2009   NL  . ESOPHAGOGASTRODUODENOSCOPY  2018  . LEEP  2000   history of CIN   . TUBAL LIGATION      OB History   No obstetric history on file.      Home Medications    Prior to Admission medications   Medication Sig Start Date End Date Taking? Authorizing Provider  bictegravir-emtricitabine-tenofovir AF (BIKTARVY) 50-200-25 MG TABS tablet Take 1 tablet by mouth daily. Try to take at the same time each day with or without food. 06/28/18  Yes [place] Callas, NP  lisinopril-hydrochlorothiazide (ZESTORETIC) 20-25 MG tablet TAKE 1 TABLET BY MOUTH DAILY 07/20/18  Yes [place] Callas, NP  mirtazapine (REMERON) 15 MG tablet TAKE 1 TABLET BY MOUTH EVERY NIGHT AT BEDTIME 03/21/18  Yes Hennessey Callas, NP  simvastatin (ZOCOR) 20 MG tablet TAKE 1 TABLET BY MOUTH DAILY 07/20/18  Yes [place] Callas, NP  zolpidem (AMBIEN CR) 12.5 MG CR tablet Take 1 tablet (12.5 mg total) by mouth at bedtime as needed. for sleep 06/27/18  Yes [place] Callas, NP  cyclobenzaprine (FLEXERIL) 5 MG tablet Take 1 tablet (5 mg total) by mouth 3 (three) times daily as needed for muscle spasms. 11/08/17   Cleona Callas,  NP  diclofenac sodium (VOLTAREN) 1 % GEL Apply 2 g topically 4 (four) times daily. Patient not taking: Reported on 06/28/2018 11/08/17   [place] Callas, NP  hyoscyamine (LEVBID) 0.375 MG 12 hr tablet Take 1 tablet (0.375 mg total) by mouth 2 (two) times daily. 07/24/16   [place] Callas, NP  ibuprofen (ADVIL) 600 MG tablet Take 1 tablet (600 mg total) by mouth every 6 (six) hours as needed. 07/27/18   Dema Timmons C, PA-C  ondansetron (ZOFRAN ODT) 8 MG disintegrating tablet Take 1 tablet (8 mg total) by mouth every 8 (eight) hours as needed for nausea. 08/03/16   Gatha Mayer, MD  pantoprazole (PROTONIX)  40 MG tablet Take 1 tablet (40 mg total) by mouth 2 (two) times daily before a meal. 06/28/18   Dixon, Melton Krebs, NP  potassium chloride SA (K-DUR,KLOR-CON) 20 MEQ tablet Take 1 tablet (20 mEq total) by mouth daily. 11/26/16   Gatha Mayer, MD  triamcinolone cream (KENALOG) 0.1 % Apply 1 application topically 2 (two) times daily. 07/27/18   Martavious Hartel, Elesa Hacker, PA-C    Family History Family History  Problem Relation Age of Onset  . Diabetes Mother   . Hyperlipidemia Mother   . Hypertension Mother   . Cancer Father        bladder cancer   . Heart disease Father   . Hypertension Father   . Diabetes Maternal Grandmother   . Hyperlipidemia Maternal Grandmother   . Colon cancer Neg Hx   . Esophageal cancer Neg Hx     Social History Social History   Tobacco Use  . Smoking status: Current Every Day Smoker    Packs/day: 0.25    Years: 30.00    Pack years: 7.50    Types: Cigarettes  . Smokeless tobacco: Never Used  . Tobacco comment:  pt. trying to quit  Substance Use Topics  . Alcohol use: No    Alcohol/week: 0.0 standard drinks  . Drug use: No     Allergies   Codeine   Review of Systems Review of Systems  Constitutional: Negative for fatigue and fever.  HENT: Negative for mouth sores.   Eyes: Negative for visual disturbance.  Respiratory: Negative for shortness of breath.   Cardiovascular: Negative for chest pain.  Gastrointestinal: Negative for abdominal pain, nausea and vomiting.  Genitourinary: Negative for genital sores.  Musculoskeletal: Positive for arthralgias, gait problem, joint swelling and myalgias.  Skin: Positive for color change. Negative for rash and wound.  Neurological: Negative for dizziness, weakness, light-headedness and headaches.     Physical Exam Triage Vital Signs ED Triage Vitals  Enc Vitals Group     BP 07/27/18 1303 120/87     Pulse Rate 07/27/18 1303 80     Resp 07/27/18 1303 17     Temp 07/27/18 1303 98.2 F (36.8 C)     Temp  Source 07/27/18 1303 Oral     SpO2 07/27/18 1303 97 %     Weight --      Height --      Head Circumference --      Peak Flow --      Pain Score 07/27/18 1301 2     Pain Loc --      Pain Edu? --      Excl. in [place]? --    No data found.  Updated Vital Signs BP 120/87 (BP Location: Left Arm)   Pulse 80   Temp 98.2 F (36.8 C) (  Oral)   Resp 17   SpO2 97%   Visual Acuity Right Eye Distance:   Left Eye Distance:   Bilateral Distance:    Right Eye Near:   Left Eye Near:    Bilateral Near:     Physical Exam Vitals signs and nursing note reviewed.  Constitutional:      Appearance: She is well-developed.     Comments: No acute distress  HENT:     Head: Normocephalic and atraumatic.     Nose: Nose normal.  Eyes:     Conjunctiva/sclera: Conjunctivae normal.  Neck:     Musculoskeletal: Neck supple.  Cardiovascular:     Rate and Rhythm: Normal rate.  Pulmonary:     Effort: Pulmonary effort is normal. No respiratory distress.  Abdominal:     General: There is no distension.  Musculoskeletal: Normal range of motion.     Comments: Left foot: Mild swelling noted over left foot compared to right, no overlying erythema or discoloration, tender to pal patient over dorsum of foot, nontender to palpation of medial malleolus, no calf pain or swelling Dorsalis pedis 2+   Skin:    General: Skin is warm and dry.     Comments: No overlying erythema  Hyperpigmented dry callused appearing area over lateral malleolus  Plantar surface of bilateral feet noted with hyperpigmented spots-patient questioned on if this was present before and she initially states that it is new  Neurological:     Mental Status: She is alert and oriented to person, place, and time.      UC Treatments / Results  Labs (all labs ordered are listed, but only abnormal results are displayed) Labs Reviewed  RPR    EKG   Radiology No results found.  Procedures Procedures (including critical care time)   Medications Ordered in UC Medications - No data to display  Initial Impression / Assessment and Plan / UC Course  I have reviewed the triage vital signs and the nursing notes.  Pertinent labs & imaging results that were available during my care of the patient were reviewed by me and considered in my medical decision making (see chart for details).     Left foot with mild swelling, no mechanism of injury, does not appear concerning for cellulitis at this time.  When noticed hyperpigmented lesions on plantar surface of foot, patient states that this is new, I expressed concern for possible syphilis given patient's HIV status.  Patient became emotional and she states that she is been working hard and taking her medicines consistently, viral load has been undetectable and she denies any new partners of recently.  It appears infectious disease last checked this and September 2019, and was nonreactive.  Advised patient the spots may have been present prior and may not have noticed.  Given bringing this up will check RPR in order to confirm nonreactive.  May be more chronic changes, rash not present on hands, no rash elsewhere.  At this time unclear cause of swelling, do not suspect DVT at this time.  Recommending anti-inflammatories temporarily.  Advised short-term use of ibuprofen, advise long-term use with her HIV meds may increase risk of kidney damage.  Will provide triamcinolone cream to apply over hyperpigmented area on ankle to see if this helps.  Continue to monitor, elevate and ice, Ace wrap.  Follow-up if developing increased pain swelling or redness, spreading into calf.Discussed strict return precautions. Patient verbalized understanding and is agreeable with plan.  Final Clinical Impressions(s) / UC Diagnoses  Final diagnoses:  Left foot pain  Swelling of left foot     Discharge Instructions     Please keep foot elevate and ice 2-3 times daily Ace wrap for compression and support  Use anti-inflammatories for pain/swelling. You may take up to 600 mg Ibuprofen every 8 hours with food. You may supplement Ibuprofen with Tylenol [phone] mg every 8 hours.  Please only use ibuprofen temporarily over the next week as long-term use of this may increase risk of kidney damage  May apply triamcinolone cream over area of darkened skin, continue to moisturize this area  I will call with results of blood work if abnormal, you should also have access to them through my chart  Please follow-up if developing increased pain swelling or redness, numbness or tingling, symptoms not resolving       ED Prescriptions    Medication Sig Dispense Auth. Provider   ibuprofen (ADVIL) 600 MG tablet Take 1 tablet (600 mg total) by mouth every 6 (six) hours as needed. 30 tablet Quintel Mccalla C, PA-C   triamcinolone cream (KENALOG) 0.1 % Apply 1 application topically 2 (two) times daily. 30 g Oriah Leinweber, [place] C, PA-C     Controlled Substance Prescriptions [place] Controlled Substance Registry consulted? Not Applicable   Janith Lima, [place] 07/28/18 [phone]

## 2018-07-28 NOTE — Telephone Encounter (Signed)
Called to inform of Negative RPR, patient still in pain, complaining of nighttime pain. Providing 10 tabs hydrocodone for severe/nightime pain. Registry checked. Follow up if symptoms persisting or not improving.

## 2018-08-01 ENCOUNTER — Ambulatory Visit: Payer: BC Managed Care – PPO

## 2018-08-01 ENCOUNTER — Other Ambulatory Visit: Payer: Self-pay

## 2018-08-01 VITALS — Ht 62.0 in | Wt 195.0 lb

## 2018-08-01 DIAGNOSIS — Z1211 Encounter for screening for malignant neoplasm of colon: Secondary | ICD-10-CM

## 2018-08-01 DIAGNOSIS — K2270 Barrett's esophagus without dysplasia: Secondary | ICD-10-CM

## 2018-08-01 DIAGNOSIS — D219 Benign neoplasm of connective and other soft tissue, unspecified: Secondary | ICD-10-CM

## 2018-08-01 NOTE — Progress Notes (Signed)
No egg or soy allergy known to patient  No issues with past sedation with any surgeries  or procedures, no intubation problems  No diet pills per patient No home 02 use per patient  No blood thinners per patient  Pt denies issues with constipation  No A fib or A flutter  EMMI video sent to pt's e mail , pt declined    

## 2018-08-16 ENCOUNTER — Telehealth: Payer: Self-pay | Admitting: Internal Medicine

## 2018-08-16 NOTE — Telephone Encounter (Signed)

## 2018-08-17 ENCOUNTER — Other Ambulatory Visit: Payer: Self-pay

## 2018-08-17 ENCOUNTER — Encounter: Payer: Self-pay | Admitting: Internal Medicine

## 2018-08-17 ENCOUNTER — Ambulatory Visit (AMBULATORY_SURGERY_CENTER): Payer: BC Managed Care – PPO | Admitting: Internal Medicine

## 2018-08-17 VITALS — BP 111/80 | HR 88 | Temp 98.9°F | Resp 26 | Ht 62.0 in | Wt 195.0 lb

## 2018-08-17 DIAGNOSIS — K2970 Gastritis, unspecified, without bleeding: Secondary | ICD-10-CM

## 2018-08-17 DIAGNOSIS — K3189 Other diseases of stomach and duodenum: Secondary | ICD-10-CM | POA: Diagnosis not present

## 2018-08-17 DIAGNOSIS — K2210 Ulcer of esophagus without bleeding: Secondary | ICD-10-CM | POA: Diagnosis not present

## 2018-08-17 DIAGNOSIS — Z1211 Encounter for screening for malignant neoplasm of colon: Secondary | ICD-10-CM | POA: Diagnosis not present

## 2018-08-17 DIAGNOSIS — K449 Diaphragmatic hernia without obstruction or gangrene: Secondary | ICD-10-CM

## 2018-08-17 DIAGNOSIS — K228 Other specified diseases of esophagus: Secondary | ICD-10-CM | POA: Diagnosis not present

## 2018-08-17 DIAGNOSIS — K2270 Barrett's esophagus without dysplasia: Secondary | ICD-10-CM

## 2018-08-17 MED ORDER — SODIUM CHLORIDE 0.9 % IV SOLN: 500.0000 mL | Freq: Once | INTRAVENOUS | Status: DC

## 2018-08-17 MED FILL — BIKTARVY 50-200-25 MG TABS: 50-200-25 | 30 days supply | Qty: 30 | Fill #2

## 2018-08-17 NOTE — Op Note (Signed)
[place] Patient Name: Amy Hernandez Procedure Date: 08/17/2018 3:11 PM MRN: [id number] Endoscopist: Gatha Mayer , MD Age: 53 Referring MD:  Date of Birth: 1965/08/21 Gender: Female Account #: [id number] Procedure:                Colonoscopy Indications:              Screening for colorectal malignant neoplasm, Last                            colonoscopy: 2009 Medicines:                Propofol per Anesthesia, Monitored Anesthesia Care Procedure:                Pre-Anesthesia Assessment:                           - Prior to the procedure, a History and Physical                            was performed, and patient medications and                            allergies were reviewed. The patient's tolerance of                            previous anesthesia was also reviewed. The risks                            and benefits of the procedure and the sedation                            options and risks were discussed with the patient.                            All questions were answered, and informed consent                            was obtained. Prior Anticoagulants: The patient has                            taken no previous anticoagulant or antiplatelet                            agents. ASA Grade Assessment: II - A patient with                            mild systemic disease. After reviewing the risks                            and benefits, the patient was deemed in                            satisfactory condition to undergo the procedure.  After obtaining informed consent, the colonoscope                            was passed under direct vision. Throughout the                            procedure, the patient's blood pressure, pulse, and                            oxygen saturations were monitored continuously. The                            Colonoscope was introduced through the anus and                            advanced to the the  cecum, identified by                            appendiceal orifice and ileocecal valve. The                            colonoscopy was performed without difficulty. The                            patient tolerated the procedure well. The quality                            of the bowel preparation was good. The bowel                            preparation used was Miralax via split dose                            instruction. The appendiceal orifice and the rectum                            were photographed. Scope In: 3:22:00 PM Scope Out: 3:32:00 PM Scope Withdrawal Time: 0 hours 7 minutes 23 seconds  Total Procedure Duration: 0 hours 10 minutes 0 seconds  Findings:                 The perianal and digital rectal examinations were                            normal.                           Multiple small-mouthed diverticula were found in                            the left colon.                           The exam was otherwise without abnormality on  direct and retroflexion views. Complications:            No immediate complications. Estimated Blood Loss:     Estimated blood loss: none. Impression:               - Diverticulosis in the left colon.                           - The examination was otherwise normal on direct                            and retroflexion views.                           - No specimens collected. Recommendation:           - Patient has a contact number available for                            emergencies. The signs and symptoms of potential                            delayed complications were discussed with the                            patient. Return to normal activities tomorrow.                            Written discharge instructions were provided to the                            patient.                           - Resume previous diet.                           - Continue present medications.                            - Repeat colonoscopy in 10 years for screening                            purposes. Gatha Mayer, MD 08/17/2018 3:45:44 PM This report has been signed electronically.

## 2018-08-17 NOTE — Progress Notes (Signed)
Pt's states no medical or surgical changes since previsit or office visit. 

## 2018-08-17 NOTE — Patient Instructions (Addendum)
The esophagus showed some changes in the lining - think acid reflux. Stay on the pantoprazole and will let you know.  I took biopsies of a small nodule also.  No polyps in the colon.  Next routine colonoscopy or other screening test in 10 years - 2030  I appreciate the opportunity to care for you. Gatha Mayer, MD, FACG YOU HAD AN ENDOSCOPIC PROCEDURE TODAY AT [facility]:   Refer to the procedure report that was given to you for any specific questions about what was found during the examination.  If the procedure report does not answer your questions, please call your gastroenterologist to clarify.  If you requested that your care partner not be given the details of your procedure findings, then the procedure report has been included in a sealed envelope for you to review at your convenience later.  YOU SHOULD EXPECT: Some feelings of bloating in the abdomen. Passage of more gas than usual.  Walking can help get rid of the air that was put into your GI tract during the procedure and reduce the bloating. If you had a lower endoscopy (such as a colonoscopy or flexible sigmoidoscopy) you may notice spotting of blood in your stool or on the toilet paper. If you underwent a bowel prep for your procedure, you may not have a normal bowel movement for a few days.  Please Note:  You might notice some irritation and congestion in your nose or some drainage.  This is from the oxygen used during your procedure.  There is no need for concern and it should clear up in a day or so.  SYMPTOMS TO REPORT IMMEDIATELY:   Following lower endoscopy (colonoscopy or flexible sigmoidoscopy):  Excessive amounts of blood in the stool  Significant tenderness or worsening of abdominal pains  Swelling of the abdomen that is new, acute  Fever of 100F or higher   Following upper endoscopy (EGD)  Vomiting of blood or coffee ground material  New chest pain or pain under the shoulder blades   Painful or persistently difficult swallowing  New shortness of breath  Fever of 100F or higher  Black, tarry-looking stools  For urgent or emergent issues, a gastroenterologist can be reached at any hour by calling [phone].   DIET:  We do recommend a small meal at first, but then you may proceed to your regular diet.  Drink plenty of fluids but you should avoid alcoholic beverages for 24 hours. Follow anti-reflux precautions (see handout given to you by your recovery nurse).  MEDICATIONS: Continue present medications. Continue pantoprazole.  Please see handouts given to you by your recovery nurse.  Dr. Celesta Aver office nurse will call you with instructions for follow-up after Dr. Carlean Purl receives the pathology results.  ACTIVITY:  You should plan to take it easy for the rest of today and you should NOT DRIVE or use heavy machinery until tomorrow (because of the sedation medicines used during the test).    FOLLOW UP: Our staff will call the number listed on your records 48-72 hours following your procedure to check on you and address any questions or concerns that you may have regarding the information given to you following your procedure. If we do not reach you, we will leave a message.  We will attempt to reach you two times.  During this call, we will ask if you have developed any symptoms of COVID 19. If you develop any symptoms (ie: fever, flu-like symptoms, shortness of  breath, cough etc.) before then, please call [phone].  If you test positive for Covid 19 in the 2 weeks post procedure, please call and report this information to [place].    If any biopsies were taken you will be contacted by phone or by letter within the next 1-3 weeks.  Please call us at [phone] if you have not heard about the biopsies in 3 weeks.   Thank you for allowing [place] to provide for your healthcare needs today.   SIGNATURES/CONFIDENTIALITY: You and/or your care partner have signed paperwork  which will be entered into your electronic medical record.  These signatures attest to the fact that that the information above on your After Visit Summary has been reviewed and is understood.  Full responsibility of the confidentiality of this discharge information lies with you and/or your care-partner.

## 2018-08-17 NOTE — Progress Notes (Signed)
Report given to PACU, vss 

## 2018-08-17 NOTE — Telephone Encounter (Signed)
Pt responded "no" to all screening questions °

## 2018-08-17 NOTE — Op Note (Signed)
[place] Patient Name: Amy Hernandez Procedure Date: 08/17/2018 3:12 PM MRN: [id number] Endoscopist: Gatha Mayer , MD Age: 53 Referring MD:  Date of Birth: September 20, 1965 Gender: Female Account #: [id number] Procedure:                Upper GI endoscopy Indications:              Heartburn, Suspected Barrett's esophagus, Follow-up                            of Barrett's esophagus, prior granular cell tumor Medicines:                Propofol per Anesthesia, Monitored Anesthesia Care Procedure:                Pre-Anesthesia Assessment:                           - Prior to the procedure, a History and Physical                            was performed, and patient medications and                            allergies were reviewed. The patient's tolerance of                            previous anesthesia was also reviewed. The risks                            and benefits of the procedure and the sedation                            options and risks were discussed with the patient.                            All questions were answered, and informed consent                            was obtained. Prior Anticoagulants: The patient has                            taken no previous anticoagulant or antiplatelet                            agents. ASA Grade Assessment: II - A patient with                            mild systemic disease. After reviewing the risks                            and benefits, the patient was deemed in                            satisfactory condition to undergo the procedure.  After obtaining informed consent, the endoscope was                            passed under direct vision. Throughout the                            procedure, the patient's blood pressure, pulse, and                            oxygen saturations were monitored continuously. The                            Endoscope was introduced through the mouth, and                  advanced to the second part of duodenum. The upper                            GI endoscopy was accomplished without difficulty.                            The patient tolerated the procedure well. Scope In: Scope Out: Findings:                 A single 3 mm submucosal nodule was found in the                            distal esophagus. Biopsies were taken with a cold                            forceps for histology. Verification of patient                            identification for the specimen was done. Estimated                            blood loss was minimal.                           There were esophageal mucosal changes suspicious                            for short-segment Barrett's esophagus present in                            the distal esophagus. The maximum longitudinal                            extent of these mucosal changes was 1.5 cm in                            length. Mucosa was biopsied with a cold forceps for                            histology in the lower third of  the esophagus. One                            specimen bottle was sent to pathology. Verification                            of patient identification for the specimen was                            done. Estimated blood loss was minimal.                           A small hiatal hernia was present.                           Patchy mildly erythematous mucosa was found in the                            prepyloric region of the stomach.                           The exam was otherwise without abnormality.                           The cardia and gastric fundus were normal on                            retroflexion. Complications:            No immediate complications. Estimated Blood Loss:     Estimated blood loss was minimal. Impression:               - Submucosal nodule found in the esophagus.                            Biopsied. ? persistent granular cell tumor                            - Esophageal mucosal changes suspicious for                            short-segment Barrett's esophagus. Biopsied. ? of                            an [place] above a < 1cm tongue - looks inflamed                           - Small hiatal hernia.                           - Erythematous mucosa in the prepyloric region of                            the stomach.                           -  The examination was otherwise normal. Recommendation:           - Patient has a contact number available for                            emergencies. The signs and symptoms of potential                            delayed complications were discussed with the                            patient. Return to normal activities tomorrow.                            Written discharge instructions were provided to the                            patient.                           - Resume previous diet.                           - Continue present medications.                           - Await pathology results.                           - She just starte pantoprazole - I think the                            Barrettr's area was inflamed and she will do better                            on PPI                           F/U to be arranged after path review Gatha Mayer, MD 08/17/2018 3:43:17 PM This report has been signed electronically.

## 2018-08-19 ENCOUNTER — Telehealth: Payer: Self-pay

## 2018-08-19 NOTE — Telephone Encounter (Signed)
  Follow up Call-  Call back number 08/17/2018 08/26/2016  Post procedure Call Back phone  # [phone]  Permission to leave phone message Yes Yes  Some recent data might be hidden     Patient questions:  Do you have a fever, pain , or abdominal swelling? No. Pain Score  0 *  Have you tolerated food without any problems? Yes.    Have you been able to return to your normal activities? Yes.    Do you have any questions about your discharge instructions: Diet   No. Medications  No. Follow up visit  No.  Do you have questions or concerns about your Care? No.  Actions: * If pain score is 4 or above: No action needed, pain <4.  1. Have you developed a fever since your procedure? no  2.   Have you had an respiratory symptoms (SOB or cough) since your procedure? no  3.   Have you tested positive for COVID 19 since your procedure no  4.   Have you had any family members/close contacts diagnosed with the COVID 19 since your procedure?  no   If yes to any of these questions please route to Joylene John, RN and Alphonsa Gin, Therapist, sports.

## 2018-08-29 NOTE — Progress Notes (Signed)
[facility] - No letter  EGD recall 1 year  Office  Let her know the biopsies show some inflammation  I would like to see her at the middle of or end of September in the office to reassess symptoms on PPI

## 2018-09-12 MED FILL — BIKTARVY 50-200-25 MG TABS: 50-200-25 | 30 days supply | Qty: 30 | Fill #3

## 2018-09-18 ENCOUNTER — Other Ambulatory Visit: Payer: Self-pay | Admitting: Infectious Diseases

## 2018-09-18 DIAGNOSIS — F320 Major depressive disorder, single episode, mild: Secondary | ICD-10-CM

## 2018-09-19 ENCOUNTER — Telehealth: Payer: Self-pay | Admitting: *Deleted

## 2018-09-19 DIAGNOSIS — N6310 Unspecified lump in the right breast, unspecified quadrant: Secondary | ICD-10-CM

## 2018-09-19 NOTE — Addendum Note (Signed)
Addended by: Harper Callas on: 09/19/2018 04:53 PM   Modules accepted: Orders

## 2018-09-19 NOTE — Telephone Encounter (Signed)
Thank you :)

## 2018-09-19 NOTE — Telephone Encounter (Signed)
Orders entered for diagnostic mammogram. Thank you

## 2018-09-19 NOTE — Telephone Encounter (Signed)
Patient spoke with [facility] to schedule upcoming mammogram.  [facility] told her to call [facility] and ask for diagnostic mammogram with ultrasound in lieu of screening mammogram, as she is having issues with her breast (right breast is tender with some painful, hard lumps).  Left breast is fine per patient. Please advise. Landis Gandy, RN

## 2018-09-21 ENCOUNTER — Other Ambulatory Visit: Payer: Self-pay | Admitting: Infectious Diseases

## 2018-09-21 DIAGNOSIS — N6310 Unspecified lump in the right breast, unspecified quadrant: Secondary | ICD-10-CM

## 2018-09-29 ENCOUNTER — Ambulatory Visit
Admission: RE | Admit: 2018-09-29 | Discharge: 2018-09-29 | Disposition: A | Discharge status: Home / Self Care | Payer: BC Managed Care – PPO | Source: Ambulatory Visit | Attending: Infectious Diseases | Admitting: Infectious Diseases

## 2018-09-29 ENCOUNTER — Other Ambulatory Visit: Payer: Self-pay

## 2018-09-29 DIAGNOSIS — R928 Other abnormal and inconclusive findings on diagnostic imaging of breast: Secondary | ICD-10-CM | POA: Diagnosis not present

## 2018-09-29 DIAGNOSIS — N6310 Unspecified lump in the right breast, unspecified quadrant: Secondary | ICD-10-CM

## 2018-09-29 DIAGNOSIS — N6489 Other specified disorders of breast: Secondary | ICD-10-CM | POA: Diagnosis not present

## 2018-10-11 ENCOUNTER — Other Ambulatory Visit: Payer: Self-pay

## 2018-10-11 ENCOUNTER — Encounter: Payer: Self-pay | Admitting: Internal Medicine

## 2018-10-11 ENCOUNTER — Ambulatory Visit (INDEPENDENT_AMBULATORY_CARE_PROVIDER_SITE_OTHER): Payer: BC Managed Care – PPO | Admitting: Internal Medicine

## 2018-10-11 VITALS — BP 132/84 | HR 87 | Temp 98.6°F | Ht 62.0 in | Wt 210.1 lb

## 2018-10-11 DIAGNOSIS — F5101 Primary insomnia: Secondary | ICD-10-CM

## 2018-10-11 DIAGNOSIS — K2100 Gastro-esophageal reflux disease with esophagitis, without bleeding: Secondary | ICD-10-CM

## 2018-10-11 DIAGNOSIS — E669 Obesity, unspecified: Secondary | ICD-10-CM | POA: Insufficient documentation

## 2018-10-11 DIAGNOSIS — R110 Nausea: Secondary | ICD-10-CM

## 2018-10-11 DIAGNOSIS — F17200 Nicotine dependence, unspecified, uncomplicated: Secondary | ICD-10-CM | POA: Insufficient documentation

## 2018-10-11 DIAGNOSIS — R112 Nausea with vomiting, unspecified: Secondary | ICD-10-CM | POA: Insufficient documentation

## 2018-10-11 DIAGNOSIS — K219 Gastro-esophageal reflux disease without esophagitis: Secondary | ICD-10-CM | POA: Insufficient documentation

## 2018-10-11 MED ORDER — MIRTAZAPINE 7.5 MG PO TABS: 7.5000 mg | ORAL_TABLET | Freq: Every day | ORAL | 1 refills | Status: DC

## 2018-10-11 MED ORDER — FAMOTIDINE 40 MG PO TABS: 40.0000 mg | ORAL_TABLET | Freq: Every day | ORAL | 1 refills | Status: DC

## 2018-10-11 NOTE — Progress Notes (Signed)
Amy Hernandez 53 y.o. [birth year] [id number]  Assessment & Plan:   Encounter Diagnoses  Name Primary?   Gastroesophageal reflux disease with esophagitis Yes   Primary insomnia    Obesity, Class II, BMI 35-39.9    Chronic nausea    Smoker      There are some interrelated problems here.  The treatment for chronic nausea and anorexia that she had the other year has caused weight gain.  The GERD is exacerbated by that.  I think the HIV medications can sometimes make people gain weight as well.  Her diet is making things worse for the GERD and probably adding to obesity.  4 sugary sodas a day is a big problem and I have asked her to reduce and to stop those.  Ambien has been linked to GERD problems as well.  Would be good if she could stop that and switch to something else but to when he moves it once is a problem.  Going to see if she can reduce to 7.5 mg of Remeron.  I am going to see her back in about 4 to 6 weeks.  We will add Pepcid at bedtime.  40 mg.  Work on lifestyle changes.  Handout about reducing carbs provided.  #1 stop sugary sodas and other drinks #2 do not eat dessert for breakfast, i.e. no cereals pastries muffins pancakes sweetened smoothies sweetened yogurt etc. #3 avoid packaged snacks #4 reduce super sized sweet foods/treats, eat dessert less often, low-carb dessert, smaller servings #5 stop low-fat and fat-free products #6 reduce servings of starch examples, bread Posta, potatoes, French fries  Needs to work on smoking as well but one thing at a time we discussed that and she is aware  I appreciate the opportunity to care for this patient. Copy to [name], NP  Subjective:   Chief Complaint: Heartburn reflux  HPI The patient is here with her young grandson today.  Follow-up of reflux., she had an EGD in July because of a history of Barrett's and's prior squamous papilloma.  It showed reflux esophagitis and no persistent nodule.  Pathology below.   Colonoscopy was performed as well at the time.   EGD/colonoscopy 08/17/2018 - reports reviewed Submucosal nodule found in the esophagus. Biopsied. ? persistent granular cell tumor - reactive squamous cmucosa, intraepithelial lymphocytosis  - Esophageal mucosal changes suspicious for short-segment Barrett's esophagus. Biopsied. ? of an [place] above a < 1cm tongue - looks inflamed - bxs show inflammation and no intestinal metaplasia - Small hiatal hernia. - Erythematous mucosa in the prepyloric region of the stomach. - The examination was otherwise normal.  - Diverticulosis in the left colon. - The examination was otherwise normal on direct and retroflexion views.   Since that time despite being on PPI she is still having problems.  She drinks 4 Pepsi's a day.  She reports having indigestion after eating hot dogs with catsup.  In the past she had had severe nausea and loss of weight problems and I put her on Remeron which helped but she has gained weight.  She has tried to reduce the Remeron or stop it more correctly but the nausea has come back.  Wt Readings from Last 3 Encounters:  10/11/18 210 lb 2 oz (95.3 kg)  08/17/18 195 lb (88.5 kg)  08/01/18 195 lb (88.5 kg)   In January 2019 she was 67 kg mirtazapine was started in October 2018.   She also continues to struggle with insomnia.  She cannot come  off Ambien either. Allergies  Allergen Reactions   Codeine     REACTION: hallucinations   Current Meds  Medication Sig   bictegravir-emtricitabine-tenofovir AF (BIKTARVY) 50-200-25 MG TABS tablet Take 1 tablet by mouth daily. Try to take at the same time each day with or without food.   lisinopril-hydrochlorothiazide (ZESTORETIC) 20-25 MG tablet TAKE 1 TABLET BY MOUTH DAILY   mirtazapine (REMERON) 15 MG tablet TAKE 1 TABLET BY MOUTH EVERY NIGHT AT BEDTIME   pantoprazole (PROTONIX) 40 MG tablet Take 40 mg by mouth daily before breakfast.   simvastatin (ZOCOR) 20 MG tablet TAKE 1  TABLET BY MOUTH DAILY   zolpidem (AMBIEN CR) 12.5 MG CR tablet Take 1 tablet (12.5 mg total) by mouth at bedtime as needed. 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 09/04/2016   HIV (human immunodeficiency virus infection) (HCC)    Hyperlipidemia    Hypertension    IBS (irritable bowel syndrome)    Insomnia    Substance abuse ([place])    Past Surgical History:  Procedure Laterality Date   ABDOMINAL HYSTERECTOMY  09/2003   ANAL SPHINCTEROTOMY     CHOLECYSTECTOMY  1990   COLONOSCOPY  2009   NL   ESOPHAGOGASTRODUODENOSCOPY  2018   LEEP  2000   history of CIN    TUBAL LIGATION     Social History   Social History Narrative   Married but legally separated, 2 daughters grandchild at home   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   Review of Systems As above  Objective:   Physical Exam BP 132/84 (BP Location: Right Arm, Patient Position: Sitting, Cuff Size: Large)    Pulse 87    Temp 98.6 F (37 C) (Oral)    Ht 5\' 2"  (1.575 m)    Wt 210 lb 2 oz (95.3 kg)    BMI 38.43 kg/m   Well-developed well-nourished obese black woman in no acute distress

## 2018-10-11 NOTE — Patient Instructions (Addendum)
  Please come back and see [place] on October 26th at 2:50pm.   We have sent the following medications to your pharmacy for you to pick up at your convenience: Remeron and Pepcid   We are giving you information to read on reducing carbs in your diet.   Continue to follow the GERD diet.   I appreciate the opportunity to care for you. Silvano Rusk, MD, [facility]

## 2018-10-11 NOTE — Assessment & Plan Note (Signed)
Ambien not god for GERD

## 2018-10-11 NOTE — Assessment & Plan Note (Signed)
[employer] Add Pepcid HS

## 2018-10-17 MED FILL — BIKTARVY 50-200-25 MG TABS: 50-200-25 | 30 days supply | Qty: 30 | Fill #4

## 2018-11-14 MED FILL — BIKTARVY 50-200-25 MG TABS: 50-200-25 | 30 days supply | Qty: 30 | Fill #5

## 2018-11-21 ENCOUNTER — Ambulatory Visit: Payer: BC Managed Care – PPO | Admitting: Internal Medicine

## 2018-11-24 ENCOUNTER — Other Ambulatory Visit: Payer: Self-pay

## 2018-11-24 ENCOUNTER — Ambulatory Visit (INDEPENDENT_AMBULATORY_CARE_PROVIDER_SITE_OTHER): Payer: BC Managed Care – PPO | Admitting: Internal Medicine

## 2018-11-24 ENCOUNTER — Encounter: Payer: Self-pay | Admitting: Internal Medicine

## 2018-11-24 DIAGNOSIS — R110 Nausea: Secondary | ICD-10-CM | POA: Diagnosis not present

## 2018-11-24 DIAGNOSIS — K219 Gastro-esophageal reflux disease without esophagitis: Secondary | ICD-10-CM

## 2018-11-24 DIAGNOSIS — E669 Obesity, unspecified: Secondary | ICD-10-CM | POA: Diagnosis not present

## 2018-11-24 NOTE — Assessment & Plan Note (Addendum)
better with the nocturnal famotidine.  We had another good discussion about weight loss and she is working on it and she is encouraged to continue to try.  I explained this is difficult and the odds are stacked against this with the types of foods that are out there but the fact that she is trying is helpful.

## 2018-11-24 NOTE — Assessment & Plan Note (Addendum)
Working on diet and has had some transient success with reducing sugary sodas.  She will continue to work on this and I have given her information for the [employer] healthy eating instruction  also diet [email or web address] for low-carb.

## 2018-11-24 NOTE — Progress Notes (Signed)
Amy Hernandez 53 y.o. 04-Apr-1965 [id number]  Assessment & Plan:  Obesity, Class II, BMI 35-39.9 Working on diet and has had some transient success with reducing sugary sodas.  She will continue to work on this and I have given her information for the [employer] healthy eating instruction  also diet [email or web address] for low-carb.  GERD (gastroesophageal reflux disease) better with the nocturnal famotidine.  We had another good discussion about weight loss and she is working on it and she is encouraged to continue to try.  I explained this is difficult and the odds are stacked against this with the types of foods that are out there but the fact that she is trying is helpful.  Chronic nausea Ok on lower dose mirtazapine Try qod as it stims appetite    She is to contact us in December to get an appointment for January  I appreciate the opportunity to care for this patient. Copy to [name], NP Subjective:   Chief Complaint: Reflux obesity  HPI Amy Hernandez is here for follow-up after a visit in September on the 15th with reflux issues.  Nocturnal famotidine 40 mg has made a big difference.  At that time I asked her to cut back on sugary sodas and to try to reduce carbohydrates.  She has had some intermittent success with reducing the sodas and is congratulated.  She has gained a lot of weight after being treated for loss of weight and chronic nausea with mirtazapine.  I reduced her mirtazapine last visit and she is tolerating it at 7.5 mg nightly.  She has tried to stop it before but after about a week or so she needs it again with insomnia and nausea issues. Allergies  Allergen Reactions  . Codeine     REACTION: hallucinations   Current Meds  Medication Sig  . bictegravir-emtricitabine-tenofovir AF (BIKTARVY) 50-200-25 MG TABS tablet Take 1 tablet by mouth daily. Try to take at the same time each day with or without food.  . famotidine (PEPCID) 40 MG tablet Take 1 tablet (40 mg total) by  mouth at bedtime.  Marland Kitchen ibuprofen (ADVIL) 600 MG tablet Take 1 tablet (600 mg total) by mouth every 6 (six) hours as needed.  Marland Kitchen lisinopril-hydrochlorothiazide (ZESTORETIC) 20-25 MG tablet TAKE 1 TABLET BY MOUTH DAILY  . mirtazapine (REMERON) 7.5 MG tablet Take 1 tablet (7.5 mg total) by mouth at bedtime.  . pantoprazole (PROTONIX) 40 MG tablet Take 40 mg by mouth daily before breakfast.  . potassium chloride SA (K-DUR,KLOR-CON) 20 MEQ tablet Take 1 tablet (20 mEq total) by mouth daily.  . simvastatin (ZOCOR) 20 MG tablet TAKE 1 TABLET BY MOUTH DAILY  . zolpidem (AMBIEN CR) 12.5 MG CR tablet Take 1 tablet (12.5 mg total) by mouth at bedtime as needed. for sleep   Past Medical History:  Diagnosis Date  . Barrett's esophagus - short segment 09/04/2016  . Bartholin gland cyst   . Cervical intraepithelial neoplasia (CIN) 2005   high grade cervical dysplasia prior to total hysterectomy - needs yearly vaginal paps   . GERD (gastroesophageal reflux disease)   . 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 HIV (human immunodeficiency virus infection) ([place])   . Hyperlipidemia   . Hypertension   . IBS (irritable bowel syndrome)   . Insomnia   . Substance abuse [facility])    Past Surgical History:  Procedure Laterality Date  . ABDOMINAL HYSTERECTOMY  09/2003  . ANAL SPHINCTEROTOMY    .  CHOLECYSTECTOMY  1990  . COLONOSCOPY  2009   NL  . ESOPHAGOGASTRODUODENOSCOPY  2018  . LEEP  2000   history of CIN   . 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   Review of Systems As above  Objective:    Physical Exam BP 130/72 (BP Location: Right Arm, Patient Position: Sitting, Cuff Size: Normal) Comment (Cuff Size): forearm  Pulse 80   Temp 98.5 F (36.9 C) (Oral)   Ht 5\' 2"  (1.575 m)   Wt 214 lb 2 oz (97.1 kg)   BMI 39.16 kg/m  No acute distress  15 minutes time spent with patient > half in counseling coordination of care

## 2018-11-24 NOTE — Assessment & Plan Note (Signed)
Ok on lower dose mirtazapine Try qod as it stims appetite

## 2018-11-24 NOTE — Patient Instructions (Signed)
Keep trying on changing the eating habits.  Try mirtazapine 7.5 mg every other night but ok if need it nightly.   For healthy eating info  1) www.[employer]in[employer].org - do the patient teaching mdules 2) www.dietdoctor.com and lear about low carb eating  Call us in early December to get a January appointment.  I appreciate the opportunity to care for you. Gatha Mayer, MD, [facility]

## 2018-12-09 MED FILL — BIKTARVY 50-200-25 MG TABS: 50-200-25 | 30 days supply | Qty: 30 | Fill #6

## 2019-01-12 MED FILL — BIKTARVY 50-200-25 MG TABS: 50-200-25 | 30 days supply | Qty: 30 | Fill #7

## 2019-01-17 ENCOUNTER — Other Ambulatory Visit: Payer: Self-pay | Admitting: Infectious Diseases

## 2019-01-17 DIAGNOSIS — I10 Essential (primary) hypertension: Secondary | ICD-10-CM

## 2019-01-17 DIAGNOSIS — E785 Hyperlipidemia, unspecified: Secondary | ICD-10-CM

## 2019-01-22 ENCOUNTER — Other Ambulatory Visit: Payer: Self-pay | Admitting: Infectious Diseases

## 2019-01-22 DIAGNOSIS — G4700 Insomnia, unspecified: Secondary | ICD-10-CM

## 2019-01-23 NOTE — Telephone Encounter (Signed)
Would you like to approve Ambien CR refill request?  Laverle Patter, RN

## 2019-02-16 ENCOUNTER — Telehealth: Payer: Self-pay

## 2019-02-16 ENCOUNTER — Telehealth: Payer: Self-pay | Admitting: Pharmacy Technician

## 2019-02-16 MED FILL — BIKTARVY 50-200-25 MG TABS: 50-200-25 | 30 days supply | Qty: 30 | Fill #8

## 2019-02-16 NOTE — Telephone Encounter (Signed)
[facility] Patient Advocate Encounter   Patient has a $3000.00 deductible that has been met with this fill. Was successful in obtaining a [place] copay card for [employer]. This copay card will make the patients copay $0 and bring down the deductible amount.  The billing information is as follows and has been shared with [place].  RxBin: [id number] PCN: [id number] Member ID: [id number] Group ID: [id number]  Bartholomew Crews, [place] Patient [facility] Phone: [phone] Fax: [phone] 02/16/2019 9:49 AM

## 2019-02-16 NOTE — Telephone Encounter (Signed)
Patient called triage requesting assistance with getting her Ambien filled. Patient stated that her father passed and she will be going to [place] to take care of the arrangements. She will be gone for 2 weeks and unable to fill her medication in that time. She was hoping to get the medication refilled prior to leaving. Patient was only able to fill her medication for 1 month and according to the pharmacist, she cannot refill it until 02/20/2019. The pharmacist stated that he would need a new prescription or to speak with the provider in order to fill it early. Confirmed with Colletta [place], NP that it is ok to refill the prescription early and relayed the confirmation to the pharmacist. Notified the patient that her medication will be refilled early.   Nadege Carriger Lorita Officer, RN

## 2019-03-16 MED FILL — BIKTARVY 50-200-25 MG TABS: 50-200-25 | 30 days supply | Qty: 30 | Fill #9

## 2019-04-03 ENCOUNTER — Other Ambulatory Visit: Payer: Self-pay

## 2019-04-03 ENCOUNTER — Encounter: Payer: Self-pay | Admitting: Infectious Diseases

## 2019-04-03 ENCOUNTER — Other Ambulatory Visit: Payer: Self-pay | Admitting: Infectious Diseases

## 2019-04-03 ENCOUNTER — Ambulatory Visit (INDEPENDENT_AMBULATORY_CARE_PROVIDER_SITE_OTHER): Payer: BC Managed Care – PPO | Admitting: Infectious Diseases

## 2019-04-03 VITALS — BP 108/74 | HR 89 | Temp 98.1°F | Ht 64.0 in | Wt 209.0 lb

## 2019-04-03 DIAGNOSIS — N62 Hypertrophy of breast: Secondary | ICD-10-CM

## 2019-04-03 DIAGNOSIS — G4700 Insomnia, unspecified: Secondary | ICD-10-CM | POA: Diagnosis not present

## 2019-04-03 DIAGNOSIS — Z23 Encounter for immunization: Secondary | ICD-10-CM

## 2019-04-03 DIAGNOSIS — F5101 Primary insomnia: Secondary | ICD-10-CM | POA: Diagnosis not present

## 2019-04-03 DIAGNOSIS — B20 Human immunodeficiency virus [HIV] disease: Secondary | ICD-10-CM

## 2019-04-03 DIAGNOSIS — Z79899 Other long term (current) drug therapy: Secondary | ICD-10-CM | POA: Diagnosis not present

## 2019-04-03 DIAGNOSIS — K2270 Barrett's esophagus without dysplasia: Secondary | ICD-10-CM

## 2019-04-03 MED ORDER — POTASSIUM CHLORIDE CRYS ER 20 MEQ PO TBCR: 20.0000 meq | EXTENDED_RELEASE_TABLET | Freq: Every day | ORAL | 3 refills | Status: DC

## 2019-04-03 NOTE — Progress Notes (Signed)
Patient Name: Amy Hernandez  Date of Birth: Sep 12, 1965 MRN: [id number]  PCP: Patient, No Pcp Per   Patient Active Problem List   Diagnosis Date Noted  . GERD (gastroesophageal reflux disease) 10/11/2018  . Smoker 10/11/2018  . Chronic nausea 10/11/2018  . Obesity, Class II, BMI 35-39.9 10/11/2018  . Large breasts 11/10/2017  . Back muscle spasm 11/10/2017  . Need for immunization against influenza 11/10/2017  . Depression 12/12/2016  . Elevated alkaline phosphatase level 12/12/2016  . 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 Barrett's esophagus - short segment 09/04/2016  . Weight gain 07/23/2016  . IBS (irritable bowel syndrome) 08/28/2014  . Insomnia 08/28/2014  . HLD (hyperlipidemia) 02/07/2009  . Human immunodeficiency virus (HIV) disease ([place]) 12/05/2005  . HTN (hypertension) 12/05/2005   SUBJECTIVE:  Brief Narrative:  Amy Hernandez is a 54 y.o. AA female with well controlled HIV on [place]. HIV Risk: heterosexual, OI Hx: none  Previous Regimens:   Atripla - well controlled but required switch d/t insurance   Odefsey - did not like s/e or food requirement   Biktarvy 06/2017 - suppressed   Genotype:   sensitive    CC:  Chief Complaint  Patient presents with  . Follow-up    Pt stated ----concern about her B/L breast.      HPI: Doing well but still struggling with a few things since last OV.  Taking her Biktarvy everyday without missed doses. No troubling side effects or access. Has noticed some weight gain since last visit.   Takes Ambien 10 mg every night. She has for years. Finds that it is only helpful 75% of the time. Mind racing and "can't shut off head" during. On a good night gets 7-8 hours but on a bad day gets only 4. Lights out at 10:00 pm. Would like to get off the Ambien completely but unclear how to do this.  Lost her father recently and experiencing higher stress than normal.   Mammogram in 09-29-2018 normal - often needs to go for  additional testing due to very dense breast tissue.  She is still very interested in breast reduction surgery. Has completed more than 4 sessions of physical therapy for back pain due to large breasts. Ready for consideration for reduction. Pap smear 10-2017 normal.    Review of Systems  Constitutional: Negative for chills, diaphoresis, fever and malaise/fatigue.       Weight gain  HENT: Negative for sore throat.   Respiratory: Negative for cough, hemoptysis, sputum production and shortness of breath.   Cardiovascular: Negative for chest pain, palpitations and leg swelling.  Gastrointestinal: Positive for heartburn. Negative for abdominal pain, blood in stool, constipation, diarrhea, nausea and vomiting.  Genitourinary: Negative for dysuria, frequency and urgency.  Musculoskeletal: Positive for back pain (upper). Negative for joint pain and myalgias.  Skin: Negative for rash.  Neurological: Negative for dizziness and headaches.  Endo/Heme/Allergies: Negative for polydipsia.  Psychiatric/Behavioral: Negative for depression. The patient does not have insomnia.     Past Medical History:  Diagnosis Date  . Barrett's esophagus - short segment 09/04/2016  . Bartholin gland cyst   . Cervical intraepithelial neoplasia (CIN) 2005   high grade cervical dysplasia prior to total hysterectomy - needs yearly vaginal paps   . GERD (gastroesophageal reflux disease)   . 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 HIV (human immunodeficiency virus infection) ([place])   . Hyperlipidemia   . Hypertension   . IBS (  irritable bowel syndrome)   . Insomnia   . Substance abuse [facility])    Outpatient Medications Prior to Visit  Medication Sig Dispense Refill  . lisinopril-hydrochlorothiazide (ZESTORETIC) 20-25 MG tablet TAKE 1 TABLET BY MOUTH DAILY 30 tablet 5  . simvastatin (ZOCOR) 20 MG tablet TAKE 1 TABLET BY MOUTH DAILY 30 tablet 5  . zolpidem (AMBIEN CR) 12.5 MG CR tablet TAKE 1 TABLET(12.5  MG) BY MOUTH AT BEDTIME AS NEEDED FOR SLEEP 90 tablet 1  . bictegravir-emtricitabine-tenofovir AF (BIKTARVY) 50-200-25 MG TABS tablet Take 1 tablet by mouth daily. Try to take at the same time each day with or without food. 30 tablet 11  . famotidine (PEPCID) 40 MG tablet Take 1 tablet (40 mg total) by mouth at bedtime. 90 tablet 1  . ibuprofen (ADVIL) 600 MG tablet Take 1 tablet (600 mg total) by mouth every 6 (six) hours as needed. 30 tablet 0  . mirtazapine (REMERON) 7.5 MG tablet Take 1 tablet (7.5 mg total) by mouth at bedtime. (Patient not taking: Reported on 04/03/2019) 30 tablet 1  . pantoprazole (PROTONIX) 40 MG tablet Take 40 mg by mouth daily before breakfast.    . potassium chloride SA (K-DUR,KLOR-CON) 20 MEQ tablet Take 1 tablet (20 mEq total) by mouth daily. (Patient not taking: Reported on 04/03/2019) 90 tablet 0   No facility-administered medications prior to visit.    Allergies  Allergen Reactions  . Codeine     REACTION: hallucinations    Physical Assessment & Diagnostic Findings: Vitals:   04/03/19 1440  BP: 108/74  Pulse: 89  Temp: 98.1 F (36.7 C)  SpO2: 99%  Weight: 209 lb (94.8 kg)  Height: 5\' 4"  (1.626 m)   Body mass index is 35.87 kg/m.  Physical Exam  Constitutional: She is oriented to person, place, and time and well-developed, well-nourished, and in no distress.  HENT:  Mouth/Throat: No oral lesions. No dental abscesses.  Cardiovascular: Normal rate, regular rhythm and normal heart sounds.  Pulmonary/Chest: Effort normal and breath sounds normal.  Abdominal: Soft. She exhibits no distension. There is no abdominal tenderness.  Musculoskeletal:        General: No tenderness. Normal range of motion.  Lymphadenopathy:    She has no cervical adenopathy.  Neurological: She is alert and oriented to person, place, and time.  Skin: Skin is warm and dry. No rash noted.  Psychiatric: Mood, affect and judgment normal.    Lab Results Lab Results  Component  Value Date   CREATININE 1.22 (H) 04/03/2019   BUN 16 04/03/2019   NA 138 04/03/2019   K 3.4 (L) 04/03/2019   CL 99 04/03/2019   CO2 31 04/03/2019    Lab Results  Component Value Date   ALT 17 04/03/2019   AST 19 04/03/2019   ALKPHOS 147 (H) 11/23/2016   BILITOT 0.3 04/03/2019    Lab Results  Component Value Date   CHOL 173 04/03/2019   HDL 40 (L) 04/03/2019   LDLCALC 104 (H) 04/03/2019   TRIG 172 (H) 04/03/2019   CHOLHDL 4.3 04/03/2019   HIV 1 RNA Quant (copies/mL)  Date Value  04/03/2019 <20 NOT DETECTED  06/28/2018 <20 NOT DETECTED  10/25/2017 <20 NOT DETECTED   CD4 T Cell Abs (/uL)  Date Value  04/03/2019 1,302  06/28/2018 1,298  10/25/2017 1,430   Problem List Items Addressed This Visit      Unprioritized   Human immunodeficiency virus (HIV) disease ([place]) - Primary (Chronic)    Well controlled  on Biktarvy. Will continue for her and update pertinent labs yearly.  Pap smear due in 2022.  Return in about 6 months (around 10/04/2019).       Relevant Orders   HIV-1 RNA quant-no reflex-bld (Completed)   T-helper cell (CD4)- ([facility] clinic only) (Completed)   COMPLETE METABOLIC PANEL WITH GFR (Completed)   CBC with Differential/Platelet (Completed)   Insomnia    She has been on Ambien for years and would like to consider dose reduction with eventual cessation. She would not like to consider this at this time given stress surrounding father's passing. Discussed Dr. Celesta Aver recommendation to get of Ambien to help with GERD symptoms.       Barrett's esophagus - short segment    Referred back to GI team for follow up assessment / treatment.       Large breasts    Discussed calling back for appointment with Dr. Marla Roe given failure of physical therapy for back pain.       Need for immunization against influenza   Relevant Orders   Flu Vaccine QUAD 36+ mos IM (Completed)    Other Visit Diagnoses    High risk medication use       Relevant Orders   Lipid  panel (Completed)      [name], MSN, NP-C [place] [facility] Pager: [phone]

## 2019-04-03 NOTE — Patient Instructions (Addendum)
Nice to see you today!  I would like to get you off your Ambien in time - in 6 months when you return after you have some time.   Please conitnue your Biktarvy every day.   Will refill your Potassium - resume one pill once a day for now  We gave you your flu shot today    COVID-19 Vaccine Information can be found at: [email or web address].uk For questions related to vaccine distribution or appointments, please email [email or web address] or call [phone].   No labs before your visit needed.

## 2019-04-04 LAB — T-HELPER CELL (CD4) - (RCID CLINIC ONLY)
CD4 % Helper T Cell: 29 % — ABNORMAL LOW (ref 33–65)
CD4 T Cell Abs: 1302 /uL (ref 400–1790)

## 2019-04-06 LAB — COMPLETE METABOLIC PANEL WITH GFR
AG Ratio: 1.7 (calc) (ref 1.0–2.5)
ALT: 17 U/L (ref 6–29)
AST: 19 U/L (ref 10–35)
Albumin: 4.7 g/dL (ref 3.6–5.1)
Alkaline phosphatase (APISO): 99 U/L (ref 37–153)
BUN/Creatinine Ratio: 13 (calc) (ref 6–22)
BUN: 16 mg/dL (ref 7–25)
CO2: 31 mmol/L (ref 20–32)
Calcium: 9.5 mg/dL (ref 8.6–10.4)
Chloride: 99 mmol/L (ref 98–110)
Creat: 1.22 mg/dL — ABNORMAL HIGH (ref 0.50–1.05)
GFR, Est African American: 59 mL/min/{1.73_m2} — ABNORMAL LOW (ref >=60)
GFR, Est Non African American: 51 mL/min/{1.73_m2} — ABNORMAL LOW (ref >=60)
Globulin: 2.7 g/dL (calc) (ref 1.9–3.7)
Glucose, Bld: 106 mg/dL — ABNORMAL HIGH (ref 65–99)
Potassium: 3.4 mmol/L — ABNORMAL LOW (ref 3.5–5.3)
Sodium: 138 mmol/L (ref 135–146)
Total Bilirubin: 0.3 mg/dL (ref 0.2–1.2)
Total Protein: 7.4 g/dL (ref 6.1–8.1)

## 2019-04-06 LAB — CBC WITH DIFFERENTIAL/PLATELET
Absolute Monocytes: 560 cells/uL (ref 200–950)
Basophils Absolute: 45 cells/uL (ref 0–200)
Basophils Relative: 0.4 %
Eosinophils Absolute: 168 cells/uL (ref 15–500)
Eosinophils Relative: 1.5 %
HCT: 42.4 % (ref 35.0–45.0)
Hemoglobin: 14.5 g/dL (ref 11.7–15.5)
Lymphs Abs: 4715 cells/uL — ABNORMAL HIGH (ref 850–3900)
MCH: 31.7 pg (ref 27.0–33.0)
MCHC: 34.2 g/dL (ref 32.0–36.0)
MCV: 92.8 fL (ref 80.0–100.0)
MPV: 11.4 fL (ref 7.5–12.5)
Monocytes Relative: 5 %
Neutro Abs: 5712 cells/uL (ref 1500–7800)
Neutrophils Relative %: 51 %
Platelets: 275 10*3/uL (ref 140–400)
RBC: 4.57 10*6/uL (ref 3.80–5.10)
RDW: 12.5 % (ref 11.0–15.0)
Total Lymphocyte: 42.1 %
WBC: 11.2 10*3/uL — ABNORMAL HIGH (ref 3.8–10.8)

## 2019-04-06 LAB — LIPID PANEL
Cholesterol: 173 mg/dL (ref <200)
HDL: 40 mg/dL — ABNORMAL LOW (ref >=50)
LDL Cholesterol (Calc): 104 mg/dL (calc) — ABNORMAL HIGH
Non-HDL Cholesterol (Calc): 133 mg/dL (calc) — ABNORMAL HIGH (ref <130)
Total CHOL/HDL Ratio: 4.3 (calc) (ref <5.0)
Triglycerides: 172 mg/dL — ABNORMAL HIGH (ref <150)

## 2019-04-06 LAB — HIV-1 RNA QUANT-NO REFLEX-BLD
HIV 1 RNA Quant: <20 copies/mL
HIV-1 RNA Quant, Log: <1.3 Log copies/mL

## 2019-04-10 MED FILL — BIKTARVY 50-200-25 MG TABS: 50-200-25 | 30 days supply | Qty: 30 | Fill #10

## 2019-04-18 ENCOUNTER — Other Ambulatory Visit: Payer: Self-pay | Admitting: Internal Medicine

## 2019-05-09 MED FILL — BIKTARVY 50-200-25 MG TABS: 50-200-25 | 30 days supply | Qty: 30 | Fill #11

## 2019-05-30 ENCOUNTER — Other Ambulatory Visit: Payer: Self-pay | Admitting: Infectious Diseases

## 2019-05-30 DIAGNOSIS — B20 Human immunodeficiency virus [HIV] disease: Secondary | ICD-10-CM

## 2019-06-12 MED FILL — BIKTARVY 50-200-25 MG TABS: 50-200-25 | 30 days supply | Qty: 30 | Fill #0

## 2019-07-01 NOTE — Assessment & Plan Note (Signed)
Discussed calling back for appointment with Dr. Marla Roe given failure of physical therapy for back pain.

## 2019-07-01 NOTE — Assessment & Plan Note (Signed)
Well controlled on [place]. Will continue for her and update pertinent labs yearly.  Pap smear due in 2022.  Return in about 6 months (around 10/04/2019).

## 2019-07-01 NOTE — Assessment & Plan Note (Signed)
Referred back to GI team for follow up assessment / treatment.

## 2019-07-01 NOTE — Assessment & Plan Note (Signed)
She has been on Ambien for years and would like to consider dose reduction with eventual cessation. She would not like to consider this at this time given stress surrounding father's passing. Discussed Dr. Celesta Aver recommendation to get of Ambien to help with GERD symptoms.

## 2019-07-06 DIAGNOSIS — H029 Unspecified disorder of eyelid: Secondary | ICD-10-CM | POA: Diagnosis not present

## 2019-07-07 DIAGNOSIS — L722 Steatocystoma multiplex: Secondary | ICD-10-CM | POA: Diagnosis not present

## 2019-07-18 MED FILL — BIKTARVY 50-200-25 MG TABS: 50-200-25 | 30 days supply | Qty: 30 | Fill #1

## 2019-07-20 ENCOUNTER — Other Ambulatory Visit: Payer: Self-pay | Admitting: Infectious Diseases

## 2019-07-20 DIAGNOSIS — H209 Unspecified iridocyclitis: Secondary | ICD-10-CM | POA: Diagnosis not present

## 2019-07-20 DIAGNOSIS — E785 Hyperlipidemia, unspecified: Secondary | ICD-10-CM

## 2019-07-20 DIAGNOSIS — I10 Essential (primary) hypertension: Secondary | ICD-10-CM

## 2019-07-20 DIAGNOSIS — G4700 Insomnia, unspecified: Secondary | ICD-10-CM

## 2019-07-21 ENCOUNTER — Other Ambulatory Visit: Payer: Self-pay | Admitting: Infectious Diseases

## 2019-07-21 NOTE — Telephone Encounter (Signed)
Please advise on refill.

## 2019-07-21 NOTE — Telephone Encounter (Signed)
Thanks Diminique -   I sent Amy Hernandez a message because our plan was to start weaning her off this. Her father passed recently and was not a great time to make changes a few months ago. If she is ready I will send in a lower dose; if not will refill at current dose x 3 months and discuss further in September.   Appreciate you

## 2019-07-24 NOTE — Telephone Encounter (Signed)
Patient requesting refill for Ambien. Routing to provider to fulfill refill request.  Amy Hernandez

## 2019-07-26 DIAGNOSIS — H209 Unspecified iridocyclitis: Secondary | ICD-10-CM | POA: Diagnosis not present

## 2019-08-10 DIAGNOSIS — H209 Unspecified iridocyclitis: Secondary | ICD-10-CM | POA: Diagnosis not present

## 2019-08-15 MED FILL — BIKTARVY 50-200-25 MG TABS: 50-200-25 | 30 days supply | Qty: 30 | Fill #2

## 2019-09-06 DIAGNOSIS — H209 Unspecified iridocyclitis: Secondary | ICD-10-CM | POA: Diagnosis not present

## 2019-09-11 MED FILL — BIKTARVY 50-200-25 MG TABS: 50-200-25 | 30 days supply | Qty: 30 | Fill #3

## 2019-09-12 ENCOUNTER — Telehealth: Payer: Self-pay

## 2019-09-12 ENCOUNTER — Telehealth: Payer: Self-pay | Admitting: Internal Medicine

## 2019-09-12 NOTE — Telephone Encounter (Signed)
Will need to see her in the office. Lot to work through to know what to do.   Does she also have a PCP to reach out to?

## 2019-09-12 NOTE — Telephone Encounter (Signed)
Patient called office today regarding concerns she is currently experiencing. States that she feels weak, loss of appetite, and occasional photosensitivity. Yesterday was nauseas/ vomiting. Is not able to be Dr. Carlean Purl until October 8th. Patient would like to be seen before then.   Patient is concerned about symptoms and would like to know what to do. Will forward message to [facility], NP. [place]

## 2019-09-12 NOTE — Telephone Encounter (Signed)
Patient reports that she is having abdominal pain that can last for days.  Diarrhea or vomiting after meals.  She will come in and see Alonza Bogus, PA on 09/21/19 1:30

## 2019-09-12 NOTE — Telephone Encounter (Signed)
Sounds good thank you

## 2019-09-12 NOTE — Telephone Encounter (Signed)
Patient does not have a PCP, but is working on finding one. Is scheduled for 8/31 at 84 with office. Understands to go to Urgent/ ED if symptoms worsen. [place]

## 2019-09-21 ENCOUNTER — Other Ambulatory Visit (INDEPENDENT_AMBULATORY_CARE_PROVIDER_SITE_OTHER): Payer: BC Managed Care – PPO

## 2019-09-21 ENCOUNTER — Encounter: Payer: Self-pay | Admitting: Gastroenterology

## 2019-09-21 ENCOUNTER — Ambulatory Visit (INDEPENDENT_AMBULATORY_CARE_PROVIDER_SITE_OTHER): Payer: BC Managed Care – PPO | Admitting: Gastroenterology

## 2019-09-21 VITALS — BP 152/98 | HR 92 | Ht 62.0 in | Wt 205.0 lb

## 2019-09-21 DIAGNOSIS — R197 Diarrhea, unspecified: Secondary | ICD-10-CM

## 2019-09-21 DIAGNOSIS — R1013 Epigastric pain: Secondary | ICD-10-CM | POA: Diagnosis not present

## 2019-09-21 DIAGNOSIS — R112 Nausea with vomiting, unspecified: Secondary | ICD-10-CM

## 2019-09-21 DIAGNOSIS — R140 Abdominal distension (gaseous): Secondary | ICD-10-CM

## 2019-09-21 LAB — LIPASE: Lipase: 26 U/L (ref 11.0–59.0)

## 2019-09-21 LAB — COMPREHENSIVE METABOLIC PANEL
ALT: 18 U/L (ref 0–35)
AST: 25 U/L (ref 0–37)
Albumin: 4.2 g/dL (ref 3.5–5.2)
Alkaline Phosphatase: 78 U/L (ref 39–117)
BUN: 13 mg/dL (ref 6–23)
CO2: 27 mEq/L (ref 19–32)
Calcium: 9.2 mg/dL (ref 8.4–10.5)
Chloride: 100 mEq/L (ref 96–112)
Creatinine, Ser: 1.16 mg/dL (ref 0.40–1.20)
GFR: 59.26 mL/min — ABNORMAL LOW (ref >60.00)
Glucose, Bld: 95 mg/dL (ref 70–99)
Potassium: 2.7 mEq/L — CL (ref 3.5–5.1)
Sodium: 138 mEq/L (ref 135–145)
Total Bilirubin: 0.3 mg/dL (ref 0.2–1.2)
Total Protein: 7.6 g/dL (ref 6.0–8.3)

## 2019-09-21 LAB — CBC WITH DIFFERENTIAL/PLATELET
Basophils Absolute: 0.1 10*3/uL (ref 0.0–0.1)
Basophils Relative: 0.6 % (ref 0.0–3.0)
Eosinophils Absolute: 0.3 10*3/uL (ref 0.0–0.7)
Eosinophils Relative: 2.3 % (ref 0.0–5.0)
HCT: 39.9 % (ref 36.0–46.0)
Hemoglobin: 13.9 g/dL (ref 12.0–15.0)
Lymphocytes Relative: 40.3 % (ref 12.0–46.0)
Lymphs Abs: 4.6 10*3/uL — ABNORMAL HIGH (ref 0.7–4.0)
MCHC: 34.7 g/dL (ref 30.0–36.0)
MCV: 94.8 fl (ref 78.0–100.0)
Monocytes Absolute: 0.7 10*3/uL (ref 0.1–1.0)
Monocytes Relative: 6.3 % (ref 3.0–12.0)
Neutro Abs: 5.8 10*3/uL (ref 1.4–7.7)
Neutrophils Relative %: 50.5 % (ref 43.0–77.0)
Platelets: 253 10*3/uL (ref 150.0–400.0)
RBC: 4.21 Mil/uL (ref 3.87–5.11)
RDW: 13.8 % (ref 11.5–15.5)
WBC: 11.5 10*3/uL — ABNORMAL HIGH (ref 4.0–10.5)

## 2019-09-21 MED ORDER — HYOSCYAMINE SULFATE 0.125 MG SL SUBL
0.1250 mg | SUBLINGUAL_TABLET | Freq: Four times a day (QID) | SUBLINGUAL | 1 refills | Status: DC | PRN
Start: 2019-09-21 — End: 2020-05-20

## 2019-09-21 MED ORDER — ONDANSETRON 4 MG PO TBDP
4.0000 mg | ORAL_TABLET | Freq: Four times a day (QID) | ORAL | 1 refills | Status: DC | PRN
Start: 2019-09-21 — End: 2020-05-20

## 2019-09-21 NOTE — Progress Notes (Signed)
09/21/2019 Amy Hernandez [id number] October 31, 1965   HISTORY OF PRESENT ILLNESS: This is a 54 year old female who is a patient of Dr. Celesta Aver.  Follows here regularly for issues with acid reflux and chronic nausea.  She was last seen by him in October 2020 at which time she was on Pepcid 40 mg daily at bedtime and mirtazapine 7.5 mg every other night for her reflux and chronic nausea symptoms.  She says that she still continues the Pepcid at bedtime, but eventually came off of the mirtazapine and had been doing well for several months without that ongoing nausea up until July.  She says that all of a sudden in July she woke up one morning and her eye could not tolerate the light.  She saw an eye doctor and they told her that her eyeball was inflamed.  They treated that, but then around the same time she started having a lot of nausea, vomiting, upper abdominal pain, and diarrhea.  She says that she can hardly keep any food down.  The stuff that she does keep down causes her to have diarrhea.  She says that she has already had diarrhea 4 times today and all she had was a sausage McMuffin this morning that she vomited back up again.  She says that the food just sits in her upper abdomen and then she says that she feels like she can kind of feel it moving down.  She says she has not any solid stools in weeks.  She complains of her abdomen feeling bloated and distended.  She is not on any new medications.   EGD/colonoscopy 08/17/2018 - reports reviewed Submucosal nodule found in the esophagus. Biopsied. ? persistent granular cell tumor - reactive squamous cmucosa, intraepithelial lymphocytosis  - Esophageal mucosal changes suspicious for short-segment Barrett's esophagus. Biopsied. ? of an [place] above a < 1cm tongue - looks inflamed - bxs show inflammation and no intestinal metaplasia - Small hiatal hernia. - Erythematous mucosa in the prepyloric region of the stomach. - The examination was otherwise  normal.  - Diverticulosis in the left colon. - The examination was otherwise normal on direct and retroflexion views.  1. Surgical [P], distal esophagus - SQUAMOCOLUMNAR JUNCTION WITH ULCERATION, ACUTE AND CHRONIC INFLAMMATION - NO INTESTINAL METAPLASIA, DYSPLASIA OR MALIGNANCY IDENTIFIED 2. Surgical [P], distal esophagus - REACTIVE SQUAMOUS MUCOSA WITH INCREASED INTRAEPITHELIAL LYMPHOCYTES - NO MALIGNANCY IDENTIFIED    Past Medical History:  Diagnosis Date  . Barrett's esophagus - short segment 09/04/2016  . Bartholin gland cyst   . Cervical intraepithelial neoplasia (CIN) 2005   high grade cervical dysplasia prior to total hysterectomy - needs yearly vaginal paps   . GERD (gastroesophageal reflux disease)   . 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 HIV (human immunodeficiency virus infection) ([place])   . Hyperlipidemia   . Hypertension   . IBS (irritable bowel syndrome)   . Insomnia   . Substance abuse [facility])    Past Surgical History:  Procedure Laterality Date  . ABDOMINAL HYSTERECTOMY  09/2003  . ANAL SPHINCTEROTOMY    . CHOLECYSTECTOMY  1990  . COLONOSCOPY  2009   NL  . ESOPHAGOGASTRODUODENOSCOPY  2018  . LEEP  2000   history of CIN   . TUBAL LIGATION      reports that she has been smoking cigarettes. She has a 7.50 pack-year smoking history. She has never used smokeless tobacco. She reports that she does not drink alcohol and does not  use drugs. family history includes Cancer in her father; Diabetes in her maternal grandmother and mother; Heart disease in her father; Hyperlipidemia in her maternal grandmother and mother; Hypertension in her father and mother. Allergies  Allergen Reactions  . Codeine     REACTION: hallucinations      Outpatient Encounter Medications as of 09/21/2019  Medication Sig  . BIKTARVY 50-200-25 MG TABS tablet TAKE 1 TABLET BY MOUTH DAILY. TRY TO TAKE AT THE SAME TIME EACH DAY WITH OR WITHOUT FOOD.  . famotidine  (PEPCID) 40 MG tablet TAKE 1 TABLET(40 MG) BY MOUTH AT BEDTIME  . lisinopril-hydrochlorothiazide (ZESTORETIC) 20-25 MG tablet TAKE 1 TABLET BY MOUTH DAILY  . potassium chloride SA (KLOR-CON) 20 MEQ tablet Take 1 tablet (20 mEq total) by mouth daily.  . simvastatin (ZOCOR) 20 MG tablet TAKE 1 TABLET BY MOUTH DAILY  . zolpidem (AMBIEN CR) 12.5 MG CR tablet TAKE 1 TABLET(12.5 MG) BY MOUTH AT BEDTIME AS NEEDED FOR SLEEP  . [DISCONTINUED] mirtazapine (REMERON) 7.5 MG tablet Take 1 tablet (7.5 mg total) by mouth at bedtime. (Patient not taking: Reported on 04/03/2019)  . [DISCONTINUED] pantoprazole (PROTONIX) 40 MG tablet TAKE 1 TABLET(40 MG) BY MOUTH TWICE DAILY BEFORE A MEAL   No facility-administered encounter medications on file as of 09/21/2019.    REVIEW OF SYSTEMS  : All other systems reviewed and negative except where noted in the History of Present Illness.   PHYSICAL EXAM: BP (!) 152/98   Pulse 92   Ht 5\' 2"  (1.575 m)   Wt 205 lb (93 kg)   BMI 37.49 kg/m  General: Well developed AA female in no acute distress Head: Normocephalic and atraumatic Eyes:  Sclerae anicteric, conjunctiva pink. Ears: Normal auditory acuity Lungs: Clear throughout to auscultation; no W/R/R. Heart: Regular rate and rhythm; no M/R/G. Abdomen: Soft, non-distended.  BS present.  Moderate diffuse TTP but more so in the epigastrium. Musculoskeletal: Symmetrical with no gross deformities  Skin: No lesions on visible extremities Extremities: No edema  Neurological: Alert oriented x 4, grossly non-focal Psychological:  Alert and cooperative. Normal mood and affect  ASSESSMENT AND PLAN: *GERD now with symptoms of nausea and vomiting and epigastric pain since July: She is only on Pepcid 40 mg daily at bedtime for acid reflux regimen.  She does not feel that this is acid reflux related.  She does have associated diarrhea with this as well.  Question if it has anything to do with her reflux or if it is some type of  infectious process or other issue.  We will schedule for EGD with Dr. Carlean Purl, but I am also going to schedule for CT scan of the abdomen and pelvis with contrast.  Will check CBC, CMP, lipase today.  Will check stool studies to rule out infectious source also.  We will try Zofran as needed for nausea and Levsin as needed for the abdominal pain.  Prescriptions both sent to pharmacy.   CC:  No ref. provider found

## 2019-09-21 NOTE — Patient Instructions (Addendum)
If you are age 52 or older, your body mass index should be between 23-30. Your Body mass index is 37.49 kg/m. If this is out of the aforementioned range listed, please consider follow up with your Primary Care Provider.  If you are age 78 or younger, your body mass index should be between 19-25. Your Body mass index is 37.49 kg/m. If this is out of the aformentioned range listed, please consider follow up with your Primary Care Provider.    Your provider has requested that you go to the basement level for lab work before leaving today. Press "B" on the elevator. The lab is located at the first door on the left as you exit the elevator.  We have sent the following medications to your pharmacy for you to pick up at your convenience: Zofran 4 mg every 6 hours as needed. Levsin 0.125 mg SL every 6 hours as needed.  You have been scheduled for a CT scan of the abdomen and pelvis at [facility] ([address]---this is in the same building as [employer]).   You are scheduled on Monday 09/25/19 at 1 pm. You should arrive 15 minutes prior to your appointment time for registration. Please follow the written instructions below on the day of your exam:  WARNING: IF YOU ARE ALLERGIC TO IODINE/X-RAY DYE, PLEASE NOTIFY RADIOLOGY IMMEDIATELY AT [phone]! YOU WILL BE GIVEN A 13 HOUR PREMEDICATION PREP.  1) Do not eat or drink anything after 9 am (4 hours prior to your test) 2) You have been given 2 bottles of oral contrast to drink. The solution may taste better if refrigerated, but do NOT add ice or any other liquid to this solution. Shake well before drinking.    Drink 1 bottle of contrast @ 11 am (2 hours prior to your exam)  Drink 1 bottle of contrast @ 12 pm (1 hour prior to your exam)  You may take any medications as prescribed with a small amount of water, if necessary. If you take any of the following medications: METFORMIN, GLUCOPHAGE, GLUCOVANCE, AVANDAMET, RIOMET,  FORTAMET, [place] MET, JANUMET, GLUMETZA or METAGLIP, you MAY be asked to HOLD this medication 48 hours AFTER the exam.  The purpose of you drinking the oral contrast is to aid in the visualization of your intestinal tract. The contrast solution may cause some diarrhea. Depending on your individual set of symptoms, you may also receive an intravenous injection of x-ray contrast/dye. Plan on being at [facility] for 30 minutes or longer, depending on the type of exam you are having performed.  This test typically takes 30-45 minutes to complete.  If you have any questions regarding your exam or if you need to reschedule, you may call the CT department at [phone] between the hours of 8:00 am and 5:00 pm, Monday-Friday.  _________________________________________________________________

## 2019-09-22 ENCOUNTER — Telehealth: Payer: Self-pay

## 2019-09-22 DIAGNOSIS — R112 Nausea with vomiting, unspecified: Secondary | ICD-10-CM

## 2019-09-22 NOTE — Telephone Encounter (Signed)
-----   Message from Loralie Champagne, PA-C sent at 09/21/2019  4:12 PM EDT ----- Please let her know that all of her labs look good except for her potassium is low at 2.7.  I see that she has lisinopril/HCTZ on her medication list as well as potassium chloride 20 mEq daily.  Please confirm that she is actually been taking these medicines since she has been having vomiting.  If she is taking them then please have her discontinue the lisinopril/HCTZ for a few days and have her increase her potassium chloride to twice daily.  Then have her return for repeat BMP on Monday.  Thank you,  Jess

## 2019-09-22 NOTE — Telephone Encounter (Signed)
Spoke to patient to inform her of recent lab results and Janett Billow Zehr's recommendations. Patient reports that she is taking Lisinopril/HCTZ but has stopped taking Potassium. Patient was instructed to discontinue her Lisinopril/HCTZ for a few days and restart Potassium 20 mEq twice daily. She knows to come by the Lab on Monday for repeat BMP. She will be further instructed regarding her medications pending lab results. All questions answered. Patient voiced understanding.

## 2019-09-23 LAB — GI PROFILE, STOOL, PCR

## 2019-09-25 ENCOUNTER — Other Ambulatory Visit: Payer: Self-pay

## 2019-09-25 ENCOUNTER — Ambulatory Visit (INDEPENDENT_AMBULATORY_CARE_PROVIDER_SITE_OTHER)
Admission: RE | Admit: 2019-09-25 | Discharge: 2019-09-25 | Disposition: A | Discharge status: Home / Self Care | Payer: BC Managed Care – PPO | Source: Ambulatory Visit | Attending: Gastroenterology | Admitting: Gastroenterology

## 2019-09-25 DIAGNOSIS — I700 Atherosclerosis of aorta: Secondary | ICD-10-CM | POA: Diagnosis not present

## 2019-09-25 DIAGNOSIS — R140 Abdominal distension (gaseous): Secondary | ICD-10-CM

## 2019-09-25 DIAGNOSIS — R197 Diarrhea, unspecified: Secondary | ICD-10-CM

## 2019-09-25 DIAGNOSIS — K439 Ventral hernia without obstruction or gangrene: Secondary | ICD-10-CM | POA: Diagnosis not present

## 2019-09-25 DIAGNOSIS — R112 Nausea with vomiting, unspecified: Secondary | ICD-10-CM

## 2019-09-25 DIAGNOSIS — R1013 Epigastric pain: Secondary | ICD-10-CM | POA: Diagnosis not present

## 2019-09-25 DIAGNOSIS — R1110 Vomiting, unspecified: Secondary | ICD-10-CM | POA: Diagnosis not present

## 2019-09-25 MED ORDER — IOHEXOL 300 MG/ML  SOLN
100.0000 mL | Freq: Once | INTRAMUSCULAR | Status: AC | PRN
  Administered 2019-09-25: 100 mL via INTRAVENOUS

## 2019-09-26 ENCOUNTER — Ambulatory Visit (INDEPENDENT_AMBULATORY_CARE_PROVIDER_SITE_OTHER): Payer: BC Managed Care – PPO | Admitting: Infectious Diseases

## 2019-09-26 ENCOUNTER — Other Ambulatory Visit (INDEPENDENT_AMBULATORY_CARE_PROVIDER_SITE_OTHER): Payer: BC Managed Care – PPO

## 2019-09-26 ENCOUNTER — Encounter: Payer: Self-pay | Admitting: Infectious Diseases

## 2019-09-26 ENCOUNTER — Other Ambulatory Visit: Payer: Self-pay

## 2019-09-26 DIAGNOSIS — A09 Infectious gastroenteritis and colitis, unspecified: Secondary | ICD-10-CM | POA: Diagnosis not present

## 2019-09-26 DIAGNOSIS — R112 Nausea with vomiting, unspecified: Secondary | ICD-10-CM

## 2019-09-26 DIAGNOSIS — B20 Human immunodeficiency virus [HIV] disease: Secondary | ICD-10-CM

## 2019-09-26 LAB — BASIC METABOLIC PANEL
BUN: 9 mg/dL (ref 6–23)
CO2: 24 mEq/L (ref 19–32)
Calcium: 8.3 mg/dL — ABNORMAL LOW (ref 8.4–10.5)
Chloride: 109 mEq/L (ref 96–112)
Creatinine, Ser: 1.1 mg/dL (ref 0.40–1.20)
GFR: 63 mL/min (ref >60.00)
Glucose, Bld: 92 mg/dL (ref 70–99)
Potassium: 3.6 mEq/L (ref 3.5–5.1)
Sodium: 141 mEq/L (ref 135–145)

## 2019-09-26 MED ORDER — AZITHROMYCIN 500 MG PO TABS: 500.0000 mg | ORAL_TABLET | Freq: Every day | ORAL | 0 refills | Status: DC

## 2019-09-26 MED ORDER — BIKTARVY 50-200-25 MG PO TABS: 1.0000 | ORAL_TABLET | Freq: Every day | ORAL | 11 refills | Status: DC

## 2019-09-26 NOTE — Patient Instructions (Addendum)
Lets try the antibiotic to see if it helps your symptoms   Please send me an update via MyChart in a week with an update.   Refills for your Biktarvy have been called in.   Please push your appointment out to late October / November

## 2019-09-26 NOTE — Progress Notes (Signed)
Patient Name: Amy Hernandez  Date of Birth: 11/17/1965 MRN: [id number]  PCP: Patient, No Pcp Per   Patient Active Problem List   Diagnosis Date Noted  . Abdominal distension (gaseous) 09/21/2019  . Abdominal pain, epigastric 09/21/2019  . GERD (gastroesophageal reflux disease) 10/11/2018  . Smoker 10/11/2018  . Nausea and vomiting 10/11/2018  . Obesity, Class II, BMI 35-39.9 10/11/2018  . Large breasts 11/10/2017  . Back muscle spasm 11/10/2017  . Need for immunization against influenza 11/10/2017  . Depression 12/12/2016  . Elevated alkaline phosphatase level 12/12/2016  . 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 Barrett's esophagus - short segment 09/04/2016  . Diarrhea 09/04/2016  . Weight gain 07/23/2016  . IBS (irritable bowel syndrome) 08/28/2014  . Insomnia 08/28/2014  . HLD (hyperlipidemia) 02/07/2009  . Human immunodeficiency virus (HIV) disease ([place]) 12/05/2005  . HTN (hypertension) 12/05/2005   SUBJECTIVE:  Brief Narrative:  Amy Hernandez is a 54 y.o. AA female with well controlled HIV on [place]. HIV Risk: heterosexual, OI Hx: none  Previous Regimens:   Atripla - well controlled but required switch d/t insurance   Odefsey - did not like s/e or food requirement   Biktarvy 06/2017 - suppressed   Genotype:   sensitive    CC:  Chief Complaint  Patient presents with  . Follow-up    No questions or concerns      HPI:  Taking her Biktarvy everyday without missed doses. No troubling side effects or access. Has noticed some weight gain since last visit.   Has been feeling poorly for nearly 2 months now. Started with significant left eye photosensitivity. She went back to her eye doctor for review - her eye doctor reported that she had inflammation that was similar to what he sees in autoimmune disorders. She was treated initially with drops.   About a week after the eye problems improved she attempted to eat a cheese steak and had double over  pain. Has had this multiple times and she keeps having vomiting.   Now she has severe post-prandial diarrhea, ongoing vomiting, pain and abdominal bloating. Even with non-meat, bland diet she has had persistent symptoms. She has hunger and appetite but weak and no energy to prepare much meals.    Review of Systems  Constitutional: Negative for chills, fever, malaise/fatigue and weight loss.  HENT: Negative for sore throat.   Respiratory: Negative for cough and sputum production.   Cardiovascular: Negative for chest pain and leg swelling.  Gastrointestinal: Negative for abdominal pain, diarrhea and vomiting.  Genitourinary: Negative for dysuria and flank pain.  Musculoskeletal: Negative for joint pain, myalgias and neck pain.  Skin: Negative for rash.  Neurological: Negative for dizziness, tingling and headaches.  Psychiatric/Behavioral: Negative for depression and substance abuse. The patient has insomnia. The patient is not nervous/anxious.     Past Medical History:  Diagnosis Date  . Barrett's esophagus - short segment 09/04/2016  . Bartholin gland cyst   . Cervical intraepithelial neoplasia (CIN) 2005   high grade cervical dysplasia prior to total hysterectomy - needs yearly vaginal paps   . GERD (gastroesophageal reflux disease)   . 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 HIV (human immunodeficiency virus infection) ([place])   . Hyperlipidemia   . Hypertension   . IBS (irritable bowel syndrome)   . Insomnia   . Substance abuse [facility])    Outpatient Medications Prior to Visit  Medication Sig Dispense Refill  .  hyoscyamine (LEVSIN SL) 0.125 MG SL tablet Place 1 tablet (0.125 mg total) under the tongue every 6 (six) hours as needed. 30 tablet 1  . lisinopril-hydrochlorothiazide (ZESTORETIC) 20-25 MG tablet TAKE 1 TABLET BY MOUTH DAILY 30 tablet 5  . potassium chloride SA (KLOR-CON) 20 MEQ tablet Take 1 tablet (20 mEq total) by mouth daily. 90 tablet 3  .  simvastatin (ZOCOR) 20 MG tablet TAKE 1 TABLET BY MOUTH DAILY 30 tablet 5  . BIKTARVY 50-200-25 MG TABS tablet TAKE 1 TABLET BY MOUTH DAILY. TRY TO TAKE AT THE SAME TIME EACH DAY WITH OR WITHOUT FOOD. 30 tablet 5  . famotidine (PEPCID) 40 MG tablet TAKE 1 TABLET(40 MG) BY MOUTH AT BEDTIME (Patient not taking: Reported on 09/29/2019) 90 tablet 1  . zolpidem (AMBIEN CR) 12.5 MG CR tablet TAKE 1 TABLET(12.5 MG) BY MOUTH AT BEDTIME AS NEEDED FOR SLEEP 90 tablet 0  . ondansetron (ZOFRAN ODT) 4 MG disintegrating tablet Take 1 tablet (4 mg total) by mouth every 6 (six) hours as needed for nausea or vomiting. 30 tablet 1   No facility-administered medications prior to visit.    Allergies  Allergen Reactions  . Codeine     REACTION: hallucinations    Physical Assessment & Diagnostic Findings: Vitals:   09/26/19 0928  BP: 138/82  Pulse: 79  SpO2: 98%  Weight: 208 lb (94.3 kg)   Body mass index is 38.04 kg/m.   Physical Exam HENT:     Mouth/Throat:     Mouth: No oral lesions.     Dentition: No dental abscesses.  Cardiovascular:     Rate and Rhythm: Normal rate and regular rhythm.     Heart sounds: Normal heart sounds.  Pulmonary:     Effort: Pulmonary effort is normal.     Breath sounds: Normal breath sounds.  Abdominal:     General: There is no distension.     Palpations: Abdomen is soft.     Tenderness: There is no abdominal tenderness.  Musculoskeletal:        General: No tenderness. Normal range of motion.  Lymphadenopathy:     Cervical: No cervical adenopathy.  Skin:    General: Skin is warm and dry.     Findings: No rash.  Neurological:     Mental Status: She is alert and oriented to person, place, and time.  Psychiatric:        Mood and Affect: Mood and affect normal.        Judgment: Judgment normal.     Lab Results Lab Results  Component Value Date   CREATININE 1.10 09/26/2019   BUN 9 09/26/2019   NA 141 09/26/2019   K 3.6 09/26/2019   CL 109 09/26/2019    CO2 24 09/26/2019    Lab Results  Component Value Date   ALT 18 09/21/2019   AST 25 09/21/2019   ALKPHOS 78 09/21/2019   BILITOT 0.3 09/21/2019    Lab Results  Component Value Date   CHOL 173 04/03/2019   HDL 40 (L) 04/03/2019   LDLCALC 104 (H) 04/03/2019   TRIG 172 (H) 04/03/2019   CHOLHDL 4.3 04/03/2019   HIV 1 RNA Quant (copies/mL)  Date Value  04/03/2019 <20 NOT DETECTED  06/28/2018 <20 NOT DETECTED  10/25/2017 <20 NOT DETECTED   CD4 T Cell Abs (/uL)  Date Value  04/03/2019 1,302  06/28/2018 1,298  10/25/2017 1,430   Problem List Items Addressed This Visit      Unprioritized  Human immunodeficiency virus (HIV) disease ([place]) (Chronic)    Remains under good control on Biktarvy once daily.  Viral load suppressed CD4 count looks good at 1302 cells. Recommended the flu shot sometime in September/October.  She is received both Covid vaccinations with Moderna.  She would like to come see me in [place].  Hopefully can start weaning some of her Ambien at that time.  Continues to have situational depression and increased stress over her health and the health of family and friends.      Relevant Medications   bictegravir-emtricitabine-tenofovir AF (BIKTARVY) 50-200-25 MG TABS tablet   Diarrhea    Stool pathogen panel recently positive for enteropathogenic E. Coli.  While this does not typically cause lingering symptoms in healthy adults, she has ongoing bouts of illness including diarrhea multiple times per day, abdominal distention, postprandial diarrhea.  We will go ahead and trial typical course of azithromycin to see if this helps.  I cautioned her that this may not be a true underlying cause to her symptoms and if she has no improvement would recommend earlier follow-up with GI for consideration of upper and lower endoscopy.           [name], MSN, NP-C [facility] [place] [facility] Pager: [phone]

## 2019-09-29 ENCOUNTER — Ambulatory Visit (AMBULATORY_SURGERY_CENTER): Payer: BC Managed Care – PPO | Admitting: Internal Medicine

## 2019-09-29 ENCOUNTER — Encounter: Payer: Self-pay | Admitting: Internal Medicine

## 2019-09-29 ENCOUNTER — Other Ambulatory Visit: Payer: Self-pay

## 2019-09-29 ENCOUNTER — Other Ambulatory Visit: Payer: Self-pay | Admitting: Internal Medicine

## 2019-09-29 VITALS — BP 108/70 | HR 79 | Temp 96.9°F | Resp 21 | Ht 62.0 in | Wt 208.0 lb

## 2019-09-29 DIAGNOSIS — K222 Esophageal obstruction: Secondary | ICD-10-CM

## 2019-09-29 DIAGNOSIS — K2100 Gastro-esophageal reflux disease with esophagitis, without bleeding: Secondary | ICD-10-CM | POA: Diagnosis not present

## 2019-09-29 DIAGNOSIS — K449 Diaphragmatic hernia without obstruction or gangrene: Secondary | ICD-10-CM | POA: Diagnosis not present

## 2019-09-29 DIAGNOSIS — K2270 Barrett's esophagus without dysplasia: Secondary | ICD-10-CM | POA: Diagnosis not present

## 2019-09-29 DIAGNOSIS — R112 Nausea with vomiting, unspecified: Secondary | ICD-10-CM

## 2019-09-29 DIAGNOSIS — K3189 Other diseases of stomach and duodenum: Secondary | ICD-10-CM | POA: Diagnosis not present

## 2019-09-29 DIAGNOSIS — R1013 Epigastric pain: Secondary | ICD-10-CM | POA: Diagnosis not present

## 2019-09-29 DIAGNOSIS — R140 Abdominal distension (gaseous): Secondary | ICD-10-CM

## 2019-09-29 MED ORDER — OMEPRAZOLE 40 MG PO CPDR: 40.0000 mg | DELAYED_RELEASE_CAPSULE | Freq: Every day | ORAL | 3 refills | Status: DC

## 2019-09-29 MED ORDER — DICYCLOMINE HCL 20 MG PO TABS: 20.0000 mg | ORAL_TABLET | Freq: Three times a day (TID) | ORAL | 2 refills | Status: DC

## 2019-09-29 MED ORDER — SODIUM CHLORIDE 0.9 % IV SOLN: 500.0000 mL | Freq: Once | INTRAVENOUS | Status: DC

## 2019-09-29 NOTE — Patient Instructions (Addendum)
There was mild inflammation seen at end of esophagus - better than last year. I took biopsies.  Also have a hiatal hernia.  I took intestinal biopsies also.  The esophagus looked mildly narrow so I dilated it.  I am prescribing medication to reduce acid (omeprazole  Dilation diet handout given to patient. Hiatal hernia handout given to patient.  Start Omeprazole 40 mg. Daily. Dicyclomine 20 mg for diarrhea that persists.  YOU HAD AN ENDOSCOPIC PROCEDURE TODAY AT [facility]:   Refer to the procedure report that was given to you for any specific questions about what was found during the examination.  If the procedure report does not answer your questions, please call your gastroenterologist to clarify.  If you requested that your care partner not be given the details of your procedure findings, then the procedure report has been included in a sealed envelope for you to review at your convenience later.  YOU SHOULD EXPECT: Some feelings of bloating in the abdomen. Passage of more gas than usual.  Walking can help get rid of the air that was put into your GI tract during the procedure and reduce the bloating. If you had a lower endoscopy (such as a colonoscopy or flexible sigmoidoscopy) you may notice spotting of blood in your stool or on the toilet paper. If you underwent a bowel prep for your procedure, you may not have a normal bowel movement for a few days.  Please Note:  You might notice some irritation and congestion in your nose or some drainage.  This is from the oxygen used during your procedure.  There is no need for concern and it should clear up in a day or so.  SYMPTOMS TO REPORT IMMEDIATELY:   Following upper endoscopy (EGD)  Vomiting of blood or coffee ground material  New chest pain or pain under the shoulder blades  Painful or persistently difficult swallowing  New shortness of breath  Fever of 100F or higher  Black, tarry-looking stools  For urgent or  emergent issues, a gastroenterologist can be reached at any hour by calling [phone]. Do not use MyChart messaging for urgent concerns.    DIET:  We do recommend a small meal at first, but then you may proceed to your regular diet.  Drink plenty of fluids but you should avoid alcoholic beverages for 24 hours.  ACTIVITY:  You should plan to take it easy for the rest of today and you should NOT DRIVE or use heavy machinery until tomorrow (because of the sedation medicines used during the test).    FOLLOW UP: Our staff will call the number listed on your records 48-72 hours following your procedure to check on you and address any questions or concerns that you may have regarding the information given to you following your procedure. If we do not reach you, we will leave a message.  We will attempt to reach you two times.  During this call, we will ask if you have developed any symptoms of COVID 19. If you develop any symptoms (ie: fever, flu-like symptoms, shortness of breath, cough etc.) before then, please call [phone].  If you test positive for Covid 19 in the 2 weeks post procedure, please call and report this information to [place].    If any biopsies were taken you will be contacted by phone or by letter within the next 1-3 weeks.  Please call us at [phone] if you have not heard about the biopsies in 3  weeks.    SIGNATURES/CONFIDENTIALITY: You and/or your care partner have signed paperwork which will be entered into your electronic medical record.  These signatures attest to the fact that that the information above on your After Visit Summary has been reviewed and is understood.  Full responsibility of the confidentiality of this discharge information lies with you and/or your care-partner.

## 2019-09-29 NOTE — Progress Notes (Signed)
Pt's states no medical or surgical changes since previsit or office visit.  Courtney- vitals 

## 2019-09-29 NOTE — Progress Notes (Signed)
Called to room to assist during endoscopic procedure.  Patient ID and intended procedure confirmed with present staff. Received instructions for my participation in the procedure from the performing physician.  

## 2019-09-29 NOTE — Op Note (Signed)
[facility] Patient Name: Amy Hernandez Procedure Date: 09/29/2019 11:07 AM MRN: [id number] Endoscopist: Gatha Mayer , MD Age: 54 Referring MD:  Date of Birth: Feb 03, 1965 Gender: Female Account #: [id number] Procedure:                Upper GI endoscopy Indications:              Diarrhea, Nausea with vomiting Medicines:                Propofol per Anesthesia, Monitored Anesthesia Care Procedure:                Pre-Anesthesia Assessment:                           - Prior to the procedure, a History and Physical                            was performed, and patient medications and                            allergies were reviewed. The patient's tolerance of                            previous anesthesia was also reviewed. The risks                            and benefits of the procedure and the sedation                            options and risks were discussed with the patient.                            All questions were answered, and informed consent                            was obtained. Prior Anticoagulants: The patient has                            taken no previous anticoagulant or antiplatelet                            agents. ASA Grade Assessment: III - A patient with                            severe systemic disease. After reviewing the risks                            and benefits, the patient was deemed in                            satisfactory condition to undergo the procedure.                           After obtaining informed consent, the endoscope was  passed under direct vision. Throughout the                            procedure, the patient's blood pressure, pulse, and                            oxygen saturations were monitored continuously. The                            Endoscope was introduced through the mouth, and                            advanced to the second part of duodenum. The upper                             GI endoscopy was accomplished without difficulty.                            The patient tolerated the procedure well. Scope In: Scope Out: Findings:                 The examined esophagus was mildly tortuous.                           One benign-appearing, intrinsic mild stenosis was                            found in the distal esophagus. The stenosis was                            traversed. A TTS dilator was passed through the                            scope. Dilation with an 18-19-20 mm balloon dilator                            was performed to 20 mm. The dilation site was                            examined and showed no change. Estimated blood                            loss: none.                           LA Grade A (one or more mucosal breaks less than 5                            mm, not extending between tops of 2 mucosal folds)                            esophagitis with no bleeding was found in the  distal esophagus. Biopsies were taken with a cold                            forceps for histology. Verification of patient                            identification for the specimen was done. Estimated                            blood loss was minimal.                           A 5 cm hiatal hernia was present.                           The cardia and gastric fundus were normal on                            retroflexion.                           The entire examined stomach was normal.                           The examined duodenum was normal. Biopsies were                            taken with a cold forceps for histology.                            Verification of patient identification for the                            specimen was done. Estimated blood loss was minimal. Complications:            No immediate complications. Estimated Blood Loss:     Estimated blood loss was minimal. Impression:               - Tortuous esophagus.                            - Benign-appearing esophageal stenosis. Dilated.                           - LA Grade A reflux esophagitis with no bleeding.                            Biopsied.                           - 5 cm hiatal hernia.                           - Normal stomach.                           - Normal examined duodenum. Biopsied. Recommendation:           -  Patient has a contact number available for                            emergencies. The signs and symptoms of potential                            delayed complications were discussed with the                            patient. Return to normal activities tomorrow.                            Written discharge instructions were provided to the                            patient.                           - Clear liquids x 1 hour then soft foods rest of                            day. Start prior diet tomorrow.                           - Continue present medications.                           - Await pathology results.                           - Start omeprazole 40 mg daily                           dicyclomine 20 mg ac for diarrhea that persists                            even after Azithromycin Tx for PCR + EPEC                           She has a hx of intestinal metaplasia x 1 and a                            granular cell tumor of esophagus in past but last                            year no intestinal metaplasia                           Based upon current and past sxs think probably a                            functional disturbance and may have been a false +  EPEC + GI pathogen panel given sensitivity of PCR                            testing and persistent Sxs Gatha Mayer, MD 09/29/2019 11:45:49 AM This report has been signed electronically.

## 2019-09-29 NOTE — Progress Notes (Signed)
Report given to PACU, vss  Robinul 0.1 mg IV given due large amount of secretions upon assessment.  MD made aware, vss

## 2019-10-04 ENCOUNTER — Telehealth: Payer: Self-pay | Admitting: *Deleted

## 2019-10-04 ENCOUNTER — Telehealth: Payer: Self-pay

## 2019-10-04 NOTE — Telephone Encounter (Signed)
1st follow up call made.  NALM 

## 2019-10-04 NOTE — Telephone Encounter (Signed)
No answer for post procedure call back. Left message for patient to call with questions or concerns. 

## 2019-10-09 MED FILL — BIKTARVY 50-200-25 MG TABS: 50-200-25 | 30 days supply | Qty: 30 | Fill #4

## 2019-10-16 ENCOUNTER — Ambulatory Visit: Payer: BC Managed Care – PPO | Admitting: Infectious Diseases

## 2019-10-19 ENCOUNTER — Other Ambulatory Visit: Payer: Self-pay | Admitting: Infectious Diseases

## 2019-10-19 ENCOUNTER — Other Ambulatory Visit: Payer: Self-pay | Admitting: Internal Medicine

## 2019-10-19 DIAGNOSIS — G4700 Insomnia, unspecified: Secondary | ICD-10-CM

## 2019-10-20 ENCOUNTER — Telehealth: Payer: Self-pay | Admitting: *Deleted

## 2019-10-20 ENCOUNTER — Other Ambulatory Visit: Payer: Self-pay | Admitting: Internal Medicine

## 2019-10-20 DIAGNOSIS — G4700 Insomnia, unspecified: Secondary | ICD-10-CM

## 2019-10-20 MED ORDER — ZOLPIDEM TARTRATE ER 12.5 MG PO TBCR: EXTENDED_RELEASE_TABLET | ORAL | 0 refills | Status: DC

## 2019-10-20 NOTE — Telephone Encounter (Signed)
Done

## 2019-10-20 NOTE — Addendum Note (Signed)
Addended by: Canfield Callas on: 10/20/2019 02:32 PM   Modules accepted: Orders

## 2019-10-20 NOTE — Telephone Encounter (Signed)
Patient requesting refill of Ambien. Will forward to provider for approval/e-script. Landis Gandy, RN

## 2019-10-22 NOTE — Assessment & Plan Note (Signed)
Remains under good control on Biktarvy once daily.  Viral load suppressed CD4 count looks good at 1302 cells. Recommended the flu shot sometime in September/October.  She is received both Covid vaccinations with Moderna.  She would like to come see me in [place].  Hopefully can start weaning some of her Ambien at that time.  Continues to have situational depression and increased stress over her health and the health of family and friends.

## 2019-10-22 NOTE — Assessment & Plan Note (Signed)
Stool pathogen panel recently positive for enteropathogenic E. Coli.  While this does not typically cause lingering symptoms in healthy adults, she has ongoing bouts of illness including diarrhea multiple times per day, abdominal distention, postprandial diarrhea.  We will go ahead and trial typical course of azithromycin to see if this helps.  I cautioned her that this may not be a true underlying cause to her symptoms and if she has no improvement would recommend earlier follow-up with GI for consideration of upper and lower endoscopy.

## 2019-10-24 ENCOUNTER — Telehealth: Payer: Self-pay | Admitting: Internal Medicine

## 2019-10-24 NOTE — Telephone Encounter (Signed)
Patient is asking about the pathology results in Epic.  We discussed the results and notes that Dr. Carlean Purl sent to her.  She is asked to keep her app with Dr. Carlean Purl on 10/7 to further discuss

## 2019-10-28 IMAGING — MG MM DIGITAL DIAGNOSTIC BILAT W/ TOMO W/ CAD
8 series · 8 of 24 positions shown · non-contrast
Comparison: Previous exam(s).

ACR Breast Density Category a: The breast tissue is almost entirely
fatty.

CLINICAL DATA: 53-year-old female with focal pain in the OUTER
RIGHT breast and for annual bilateral mammogram.

EXAM:
DIGITAL DIAGNOSTIC BILATERAL MAMMOGRAM WITH CAD AND TOMO
ULTRASOUND RIGHT BREAST

[R MLO synth-2D]
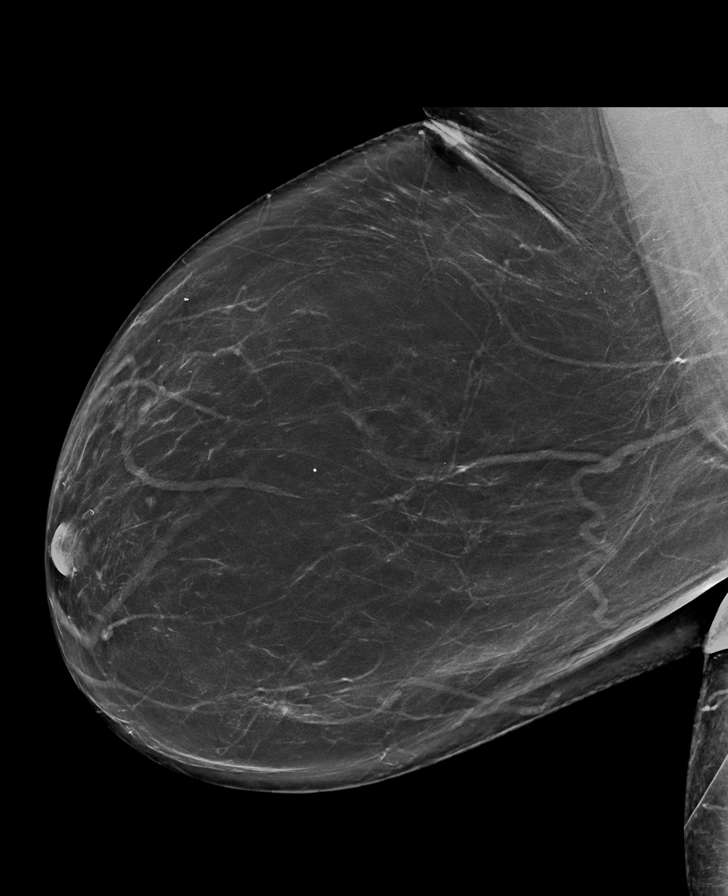

[L MLO synth-2D]
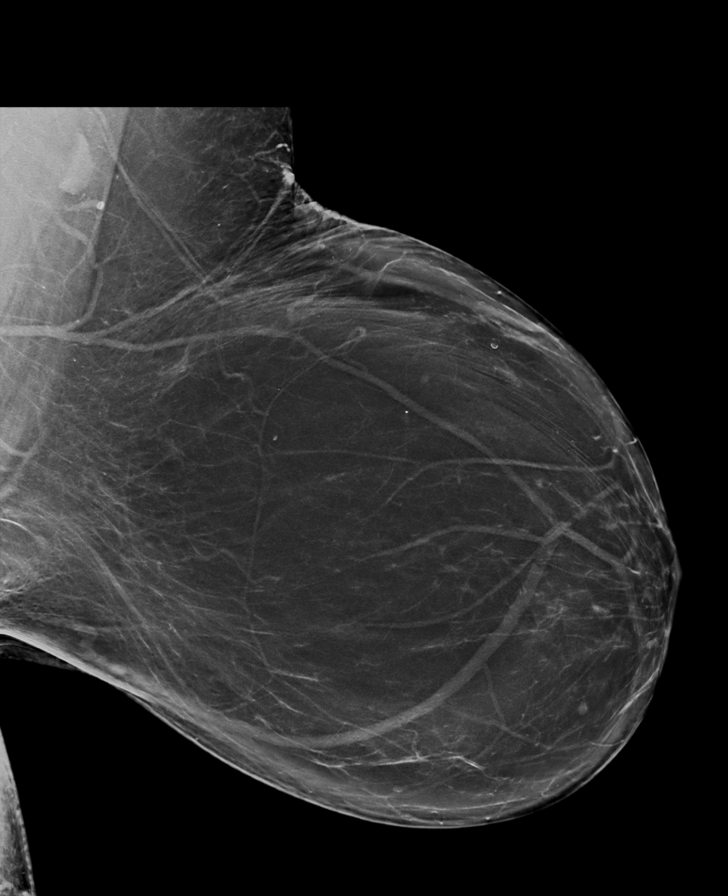

[R CC synth-2D]
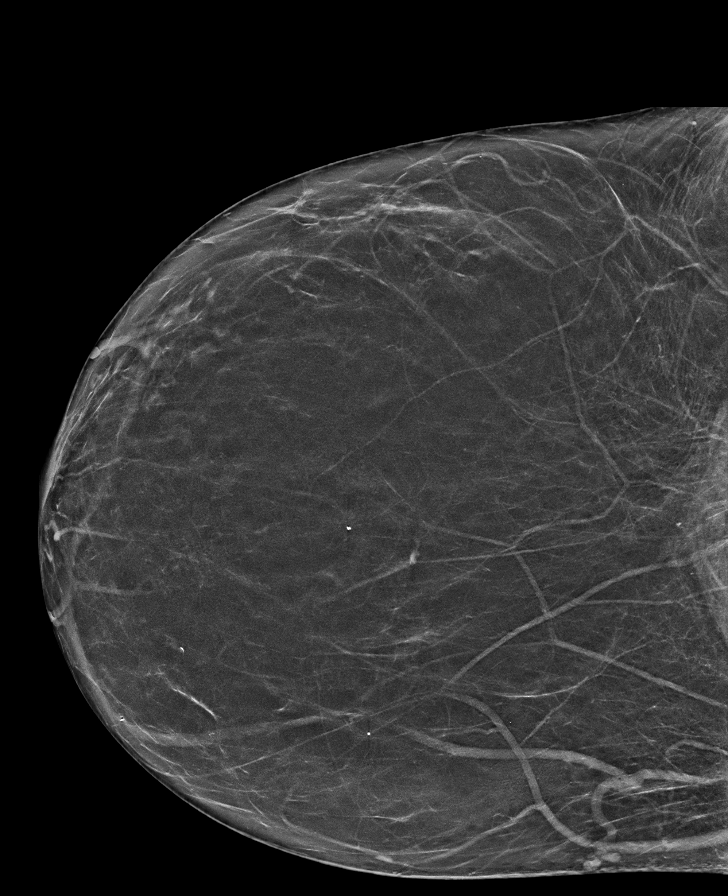

[L CC synth-2D]
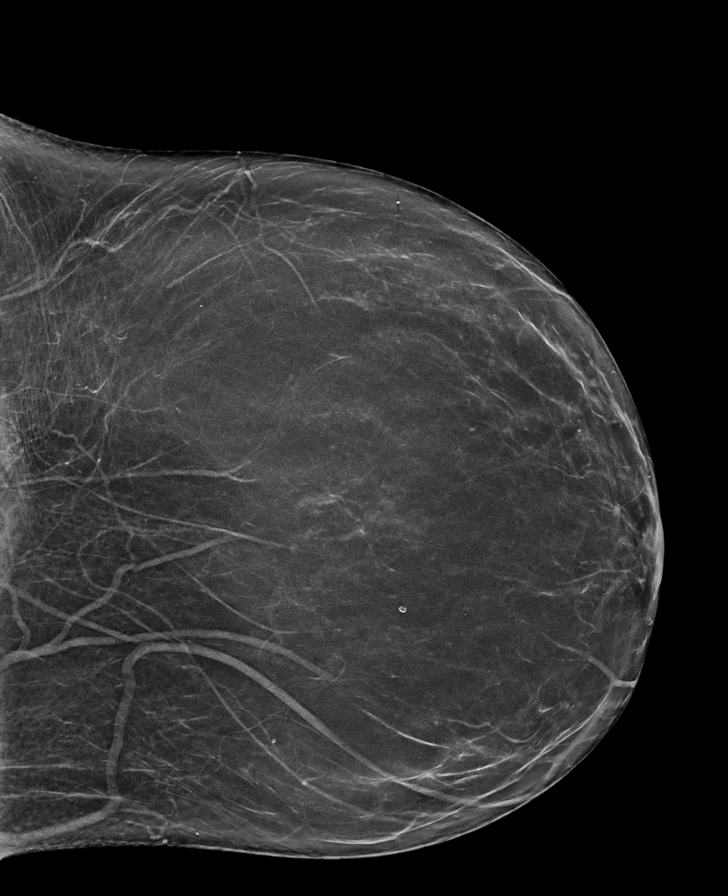

[L CC tomo · tomo slice 35/69.0]
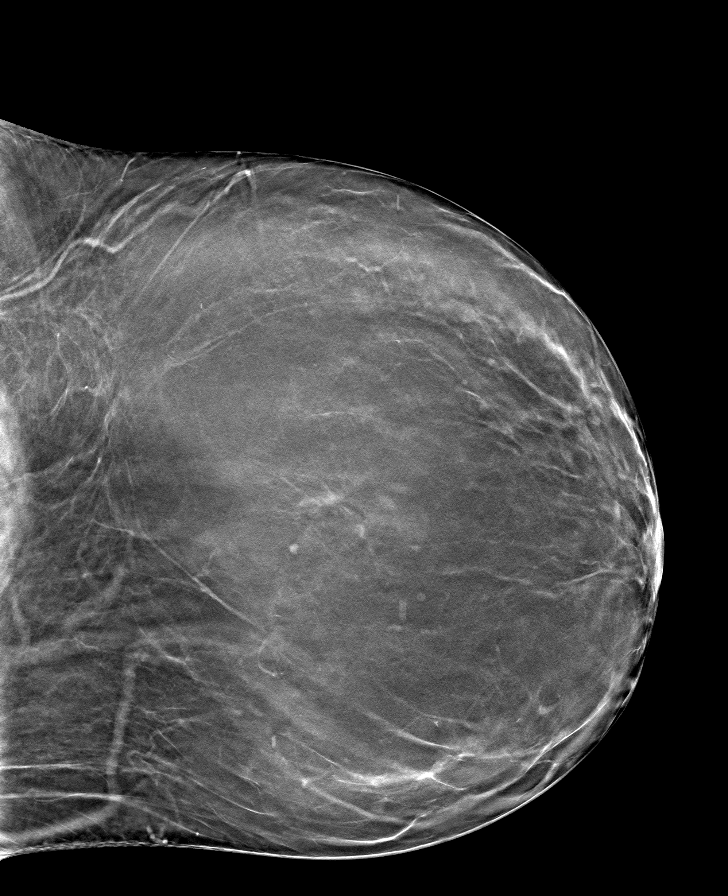

[R CC tomo · tomo slice 35/70.0]
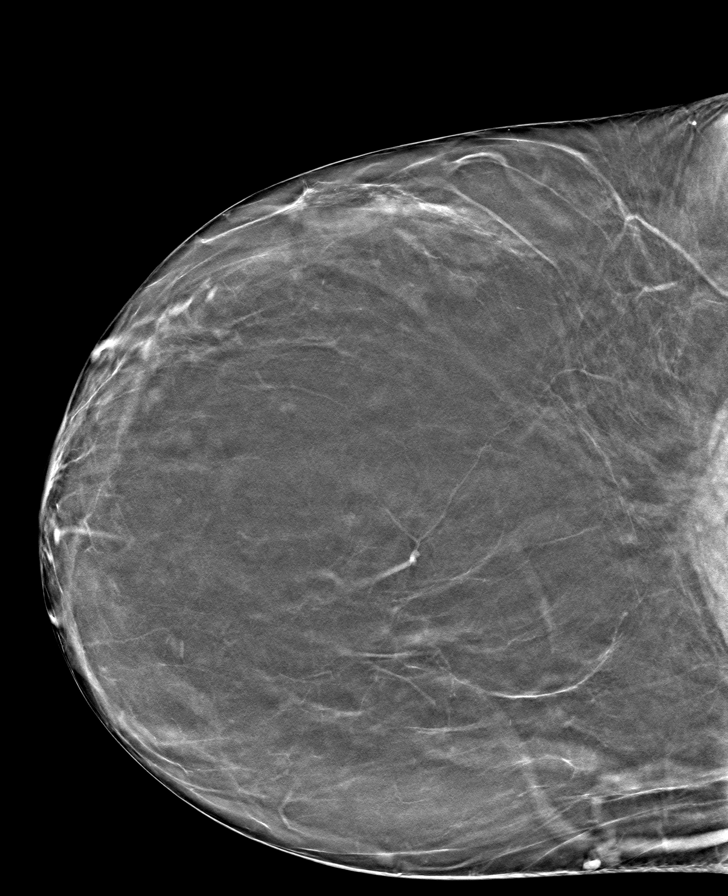

[R MLO tomo · tomo slice 46/91.0]
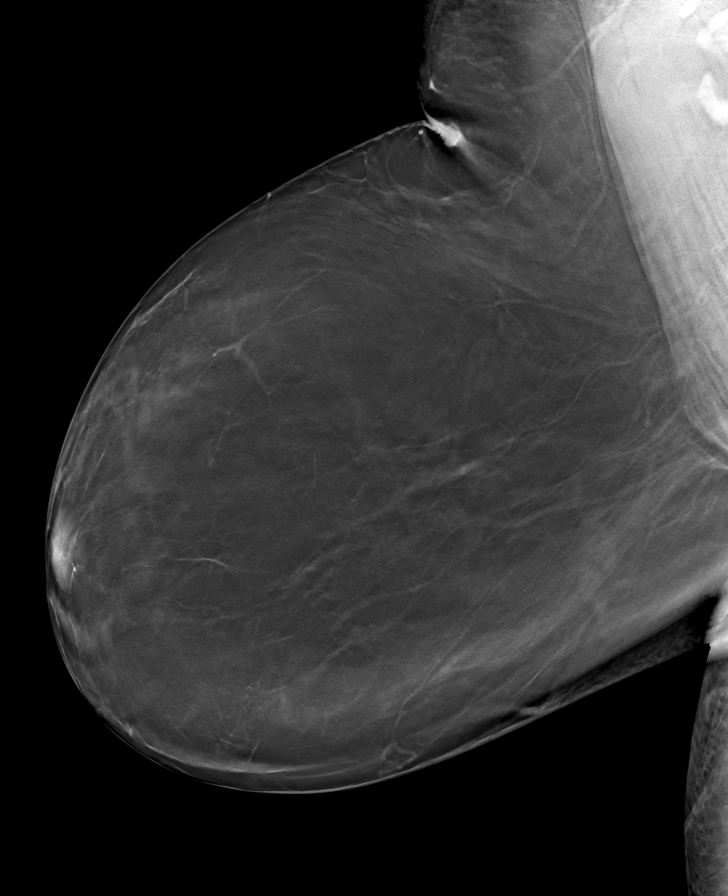

[L MLO tomo · tomo slice 46/91.0]
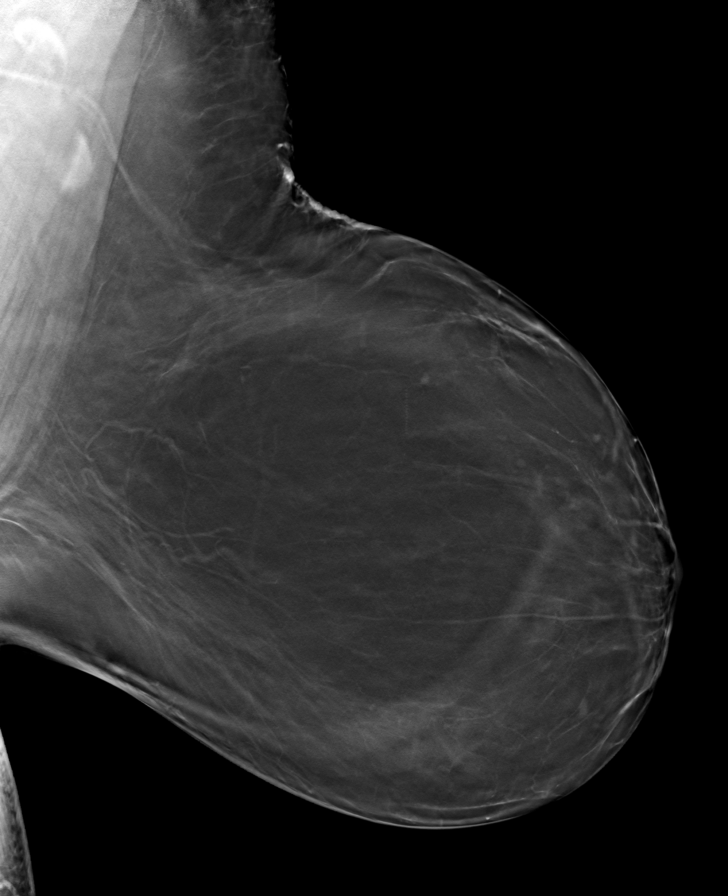

[8 of 24 positions shown; findings below may reference images not displayed]

FINDINGS: 2D/3D full field views of both breasts demonstrate no suspicious
mass, distortion or worrisome calcifications.

Mammographic images were processed with CAD.

Targeted ultrasound is performed, showing no solid or cystic mass,
distortion or abnormal shadowing within the OUTER RIGHT breast.
IMPRESSION: No mammographic or sonographic abnormality within the OUTER RIGHT
breast, in the area of patient's pain.

No mammographic evidence of breast malignancy.

RECOMMENDATION:
Bilateral screening mammogram in 1 year.

I have discussed the findings, causes of breast pain and
recommendations with the patient. If applicable, a reminder letter
will be sent to the patient regarding the next appointment.

BI-RADS CATEGORY  1: Negative.

## 2019-11-02 ENCOUNTER — Ambulatory Visit (INDEPENDENT_AMBULATORY_CARE_PROVIDER_SITE_OTHER): Payer: BC Managed Care – PPO | Admitting: Internal Medicine

## 2019-11-02 ENCOUNTER — Encounter: Payer: Self-pay | Admitting: Internal Medicine

## 2019-11-02 VITALS — BP 120/86 | HR 91 | Ht 62.0 in | Wt 207.0 lb

## 2019-11-02 DIAGNOSIS — K580 Irritable bowel syndrome with diarrhea: Secondary | ICD-10-CM | POA: Diagnosis not present

## 2019-11-02 DIAGNOSIS — K219 Gastro-esophageal reflux disease without esophagitis: Secondary | ICD-10-CM

## 2019-11-02 DIAGNOSIS — K222 Esophageal obstruction: Secondary | ICD-10-CM

## 2019-11-02 DIAGNOSIS — K2270 Barrett's esophagus without dysplasia: Secondary | ICD-10-CM

## 2019-11-02 MED ORDER — CHOLESTYRAMINE 4 G PO PACK
4.0000 g | PACK | Freq: Every day | ORAL | 0 refills | Status: DC
Start: 2019-11-02 — End: 2020-02-08

## 2019-11-02 MED ORDER — DICYCLOMINE HCL 20 MG PO TABS: 20.0000 mg | ORAL_TABLET | Freq: Three times a day (TID) | ORAL | 2 refills | Status: DC

## 2019-11-02 NOTE — Progress Notes (Signed)
Amy Hernandez 54 y.o. 03/03/1965 [id number]  Assessment & Plan:   Encounter Diagnoses  Name Primary?  . Barrett's esophagus without dysplasia Yes  . Irritable bowel syndrome with diarrhea   . GERD with stricture    Dysphagia is improved after dilation.  Continue PPI.  Plan for repeat EGD 6 months after last to follow-up focal glandular atypia and Barrett's esophagus.  Regarding diarrhea I think this is an irritable bowel phenomenon.  Colonoscopy is up-to-date July 2020 with diverticulosis no polyps on screening exam.  She has some hyoscyamine at home and some dicyclomine she probably did not quite understand how to use these, but after talking to her I want to try her on some cholestyramine 4 g with supper.  She is to message me with an update as to the effectiveness of this.  Further plans pending clinical course.  It is still okay to use dicyclomine as needed and/or hyoscyamine as needed.    Subjective:   Chief Complaint: Follow-up after EGD, having diarrhea  HPI Status post upper GI endoscopy 09/29/2019 with GE junction stenosis dilated to 20 mm, 5 cm hiatal hernia and biopsies of esophagitis showing short segment Barrett's esophagus with focal glandular atypia and duodenal biopsies with mild peptic injury changes.  She reports she is feeling better with no dysphagia.  Bloating is improved.  However she has significant postprandial diarrhea problems.  1 to 2 hours after eating she has to rush to the bathroom to defecate.  Not a nocturnal problem.  Stools are soft to loose.  Wt Readings from Last 3 Encounters:  11/02/19 207 lb (93.9 kg)  09/29/19 208 lb (94.3 kg)  09/26/19 208 lb (94.3 kg)     Allergies  Allergen Reactions  . Codeine     REACTION: hallucinations   Current Meds  Medication Sig  . bictegravir-emtricitabine-tenofovir AF (BIKTARVY) 50-200-25 MG TABS tablet Take 1 tablet by mouth daily.  Marland Kitchen dicyclomine (BENTYL) 20 MG tablet Take 1 tablet (20 mg total) by mouth 3  (three) times daily before meals. As needed  . famotidine (PEPCID) 40 MG tablet TAKE 1 TABLET(40 MG) BY MOUTH AT BEDTIME  . hyoscyamine (LEVSIN SL) 0.125 MG SL tablet Place 1 tablet (0.125 mg total) under the tongue every 6 (six) hours as needed.  Marland Kitchen lisinopril-hydrochlorothiazide (ZESTORETIC) 20-25 MG tablet TAKE 1 TABLET BY MOUTH DAILY  . omeprazole (PRILOSEC) 40 MG capsule Take 1 capsule (40 mg total) by mouth daily.  . ondansetron (ZOFRAN ODT) 4 MG disintegrating tablet Take 1 tablet (4 mg total) by mouth every 6 (six) hours as needed for nausea or vomiting.  . potassium chloride SA (KLOR-CON) 20 MEQ tablet Take 1 tablet (20 mEq total) by mouth daily.  . simvastatin (ZOCOR) 20 MG tablet TAKE 1 TABLET BY MOUTH DAILY  . zolpidem (AMBIEN CR) 12.5 MG CR tablet TAKE 1 TABLET(12.5 MG) BY MOUTH AT BEDTIME AS NEEDED FOR SLEEP  . [DISCONTINUED] dicyclomine (BENTYL) 20 MG tablet Take 1 tablet (20 mg total) by mouth 3 (three) times daily before meals.   Past Medical History:  Diagnosis Date  . Barrett's esophagus - short segment 09/04/2016  . Bartholin gland cyst   . Cervical intraepithelial neoplasia (CIN) 2005   high grade cervical dysplasia prior to total hysterectomy - needs yearly vaginal paps   . GERD (gastroesophageal reflux disease)   . 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 HIV (human immunodeficiency virus infection) ([place])   . Hyperlipidemia   .  Hypertension   . IBS (irritable bowel syndrome)   . Insomnia   . Substance abuse [facility])    Past Surgical History:  Procedure Laterality Date  . ABDOMINAL HYSTERECTOMY  09/2003  . ANAL SPHINCTEROTOMY    . CHOLECYSTECTOMY  1990  . COLONOSCOPY  2009   NL  . ESOPHAGOGASTRODUODENOSCOPY  2018  . LEEP  2000   history of CIN   . TUBAL LIGATION     Social History   Social History Narrative   Married but legally separated, 2 daughters grandchild at home   Customer service rep for [employer]   No  alcohol or drug use    She is a smoker   family history includes Cancer in her father; Diabetes in her maternal grandmother and mother; Heart disease in her father; Hyperlipidemia in her maternal grandmother and mother; Hypertension in her father and mother.   Review of Systems As above Objective:   Physical Exam  BP 120/86   Pulse 91   Ht 5\' 2"  (1.575 m)   Wt 207 lb (93.9 kg)   BMI 37.86 kg/m

## 2019-11-02 NOTE — Patient Instructions (Addendum)
  If you are age 52 or younger, your body mass index should be between 19-25. Your Body mass index is 37.86 kg/m. If this is out of the aformentioned range listed, please consider follow up with your Primary Care Provider.   We have sent the following medications to your pharmacy for you to pick up at your convenience: Cholestyramine  4 g daily with supper   Please send Dr.Gessner a message through [facility] to let him know how you are doing.   Thank you for choosing me and [facility].  Gatha Mayer, M.D., [facility]

## 2019-11-07 MED FILL — BIKTARVY 50-200-25 MG TABS: 50-200-25 | 30 days supply | Qty: 30 | Fill #5

## 2019-11-14 ENCOUNTER — Encounter: Payer: Self-pay | Admitting: Infectious Diseases

## 2019-11-14 ENCOUNTER — Other Ambulatory Visit: Payer: Self-pay

## 2019-11-14 ENCOUNTER — Ambulatory Visit (INDEPENDENT_AMBULATORY_CARE_PROVIDER_SITE_OTHER): Payer: BC Managed Care – PPO | Admitting: Infectious Diseases

## 2019-11-14 VITALS — Wt 209.0 lb

## 2019-11-14 DIAGNOSIS — F5101 Primary insomnia: Secondary | ICD-10-CM | POA: Diagnosis not present

## 2019-11-14 DIAGNOSIS — K580 Irritable bowel syndrome with diarrhea: Secondary | ICD-10-CM | POA: Diagnosis not present

## 2019-11-14 DIAGNOSIS — Z23 Encounter for immunization: Secondary | ICD-10-CM | POA: Diagnosis not present

## 2019-11-14 DIAGNOSIS — B20 Human immunodeficiency virus [HIV] disease: Secondary | ICD-10-CM

## 2019-11-14 NOTE — Assessment & Plan Note (Signed)
Given that her reflux symptoms are not currently aggravated and stable on medicine she would like to continue Ambien for now.  She is overall uncomfortable with how many medications she requires daily so would like to at some point get her off of this.

## 2019-11-14 NOTE — Assessment & Plan Note (Signed)
Continue Biktarvy once daily.  We'll continue with once yearly labs and see her back in 6 months. Discussed recommendations regarding Covid vaccine booster, 6 months following the completion of her primary series. Flu vaccine updated today.

## 2019-11-14 NOTE — Assessment & Plan Note (Signed)
She is working with Dr. Carlean Purl.  Recent colonoscopy in July 2020 did not reveal any concerns for inflammatory bowel disease.  Given her recent eye inflammation that she was under treatment for with an ophthalmologist she may be developing IBD.  I'm hopeful that she will have some good success on the cholestyramine packets at night as her largest complaint right now is postprandial diarrhea. I encouraged her to also consider symptom diary/food journal.  Helpful to help identify further triggers for diet elimination.  I also offered to refer her to dietitian for ongoing discussions and assistance to manage some of her GI symptoms.  She will consider and let me know

## 2019-11-14 NOTE — Progress Notes (Signed)
Patient Name: Amy Hernandez  Date of Birth: [birth year] MRN: [id number]  PCP: Patient, No Pcp Per     Patient Active Problem List   Diagnosis Date Noted  . Abdominal distension (gaseous) 09/21/2019  . Abdominal pain, epigastric 09/21/2019  . GERD (gastroesophageal reflux disease) 10/11/2018  . Smoker 10/11/2018  . Nausea and vomiting 10/11/2018  . Obesity, Class II, BMI 35-39.9 10/11/2018  . Large breasts 11/10/2017  . Back muscle spasm 11/10/2017  . Need for immunization against influenza 11/10/2017  . Depression 12/12/2016  . Elevated alkaline phosphatase level 12/12/2016  . 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 Barrett's esophagus - short segment 09/04/2016  . Diarrhea 09/04/2016  . Weight gain 07/23/2016  . IBS (irritable bowel syndrome) 08/28/2014  . Insomnia 08/28/2014  . HLD (hyperlipidemia) 02/07/2009  . Human immunodeficiency virus (HIV) disease ([place]) 12/05/2005  . HTN (hypertension) 12/05/2005     SUBJECTIVE:  Brief Narrative:  Amy Hernandez is a 54 y.o. female with well controlled HIV on Biktarvy. HIV Risk: heterosexual, OI Hx: none  Previous Regimens:   Atripla - well controlled but required switch d/t insurance   Odefsey - did not like s/e or food requirement   Biktarvy 06/2017 - suppressed   Genotype:   sensitive    CC:  Chief Complaint  Patient presents with  . Follow-up    flu vaccine,      HPI: Amy Hernandez has been struggling with some ongoing GI symptoms now but have worsened over the last 4 to 6 months.  She is working with Dr. Carlean Purl with new medications to help treat some of her symptoms.  When I saw her last a few months back we trialed an antibiotic for possibly infectious related diarrhea.  She had some benefit from this at first and since the nausea/vomiting has resolved, but reports that she still has significant urgency to use the bathroom about 90 minutes following any oral intake and it is always diarrhea.  She also feels  like she bloats significantly after eating anything.  She has not been able to determine a pattern necessarily aside from red meat being a major trigger to which she is avoiding exclusively now.  Taking her Biktarvy everyday without missed doses. No troubling side effects or access.   She says her stress is still an ongoing thing she is working on.  Working from home and being in environment 24/7 has been hard for her.  She'd like to implement some activities out of the house including a walking program in the evenings after work. She is very nervous to drop her Ambien down and would like to continue current dose.  She has not had any chest burning or reflux issues, Dr. Arelia Longest was worried that this was making those symptoms worse at one point.    Review of Systems  Constitutional: Negative for chills, fever, malaise/fatigue and weight loss.  HENT: Negative for sore throat.   Respiratory: Negative for cough and sputum production.   Cardiovascular: Negative for chest pain and leg swelling.  Gastrointestinal: Negative for abdominal pain, diarrhea and vomiting.  Genitourinary: Negative for dysuria and flank pain.  Musculoskeletal: Negative for joint pain, myalgias and neck pain.  Skin: Negative for rash.  Neurological: Negative for dizziness, tingling and headaches.  Psychiatric/Behavioral: Negative for depression and substance abuse. The patient has insomnia. The patient is not nervous/anxious.     Past Medical History:  Diagnosis Date  . Barrett's esophagus - short segment 09/04/2016  .  Bartholin gland cyst   . Cervical intraepithelial neoplasia (CIN) 2005   high grade cervical dysplasia prior to total hysterectomy - needs yearly vaginal paps   . GERD (gastroesophageal reflux disease)   . 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 HIV (human immunodeficiency virus infection) ([place])   . Hyperlipidemia   . Hypertension   . IBS (irritable bowel syndrome)   . Insomnia   .  Substance abuse [facility])    Outpatient Medications Prior to Visit  Medication Sig Dispense Refill  . bictegravir-emtricitabine-tenofovir AF (BIKTARVY) 50-200-25 MG TABS tablet Take 1 tablet by mouth daily. 30 tablet 11  . cholestyramine (QUESTRAN) 4 g packet Take 1 packet (4 g total) by mouth daily with supper. 90 each 0  . dicyclomine (BENTYL) 20 MG tablet Take 1 tablet (20 mg total) by mouth 3 (three) times daily before meals. As needed 90 tablet 2  . famotidine (PEPCID) 40 MG tablet TAKE 1 TABLET(40 MG) BY MOUTH AT BEDTIME 90 tablet 1  . hyoscyamine (LEVSIN SL) 0.125 MG SL tablet Place 1 tablet (0.125 mg total) under the tongue every 6 (six) hours as needed. 30 tablet 1  . lisinopril-hydrochlorothiazide (ZESTORETIC) 20-25 MG tablet TAKE 1 TABLET BY MOUTH DAILY 30 tablet 5  . omeprazole (PRILOSEC) 40 MG capsule Take 1 capsule (40 mg total) by mouth daily. 90 capsule 3  . ondansetron (ZOFRAN ODT) 4 MG disintegrating tablet Take 1 tablet (4 mg total) by mouth every 6 (six) hours as needed for nausea or vomiting. 30 tablet 1  . potassium chloride SA (KLOR-CON) 20 MEQ tablet Take 1 tablet (20 mEq total) by mouth daily. 90 tablet 3  . simvastatin (ZOCOR) 20 MG tablet TAKE 1 TABLET BY MOUTH DAILY 30 tablet 5  . zolpidem (AMBIEN CR) 12.5 MG CR tablet TAKE 1 TABLET(12.5 MG) BY MOUTH AT BEDTIME AS NEEDED FOR SLEEP 90 tablet 0   No facility-administered medications prior to visit.    Allergies  Allergen Reactions  . Codeine     REACTION: hallucinations    Physical Assessment & Diagnostic Findings: Vitals:   11/14/19 0936  Weight: 209 lb (94.8 kg)   Body mass index is 38.23 kg/m.   Physical Exam Deferred as our visit was spent in direct counseling    Lab Results Lab Results  Component Value Date   CREATININE 1.10 09/26/2019   BUN 9 09/26/2019   NA 141 09/26/2019   K 3.6 09/26/2019   CL 109 09/26/2019   CO2 24 09/26/2019    Lab Results  Component Value Date   ALT 18 09/21/2019    AST 25 09/21/2019   ALKPHOS 78 09/21/2019   BILITOT 0.3 09/21/2019    Lab Results  Component Value Date   CHOL 173 04/03/2019   HDL 40 (L) 04/03/2019   LDLCALC 104 (H) 04/03/2019   TRIG 172 (H) 04/03/2019   CHOLHDL 4.3 04/03/2019   HIV 1 RNA Quant (copies/mL)  Date Value  04/03/2019 <20 NOT DETECTED  06/28/2018 <20 NOT DETECTED  10/25/2017 <20 NOT DETECTED   CD4 T Cell Abs (/uL)  Date Value  04/03/2019 1,302  06/28/2018 1,298  10/25/2017 1,430   Problem List Items Addressed This Visit      Unprioritized   Need for immunization against influenza - Primary    We'll give her her flu shot today.      Relevant Orders   Flu Vaccine QUAD 36+ mos IM (Completed)   Insomnia    Given  that her reflux symptoms are not currently aggravated and stable on medicine she would like to continue Ambien for now.  She is overall uncomfortable with how many medications she requires daily so would like to at some point get her off of this.      IBS (irritable bowel syndrome)    She is working with Dr. Carlean Purl.  Recent colonoscopy in July 2020 did not reveal any concerns for inflammatory bowel disease.  Given her recent eye inflammation that she was under treatment for with an ophthalmologist she may be developing IBD.  I'm hopeful that she will have some good success on the cholestyramine packets at night as her largest complaint right now is postprandial diarrhea. I encouraged her to also consider symptom diary/food journal.  Helpful to help identify further triggers for diet elimination.  I also offered to refer her to dietitian for ongoing discussions and assistance to manage some of her GI symptoms.  She will consider and let me know      Human immunodeficiency virus (HIV) disease ([place]) (Chronic)    Continue Biktarvy once daily.  We'll continue with once yearly labs and see her back in 6 months. Discussed recommendations regarding Covid vaccine booster, 6 months following the completion of  her primary series. Flu vaccine updated today.        I spent 15 minutes in direct discussion with the patient.   [name], MSN, NP-C [facility] [place] [facility] Pager: [phone]

## 2019-11-14 NOTE — Patient Instructions (Addendum)
Continue your Biktarvy everyday as you are Plan to follow up again in 6 months - we can do labs at the visit.   Recommendations just came out about COVID boosters for Moderna - currently recommended to consider a booster shot (so just one this time) 6 months after your last dose.  Food diary! Happy to refer you to a dietician if you want to talk with them about anything helpful to alleviate some of your symptoms.

## 2019-11-14 NOTE — Assessment & Plan Note (Signed)
We'll give her her flu shot today.

## 2019-11-24 DIAGNOSIS — G453 Amaurosis fugax: Secondary | ICD-10-CM | POA: Diagnosis not present

## 2019-11-27 ENCOUNTER — Emergency Department (HOSPITAL_COMMUNITY)
Admission: EM | Admit: 2019-11-27 | Discharge: 2019-11-27 | Disposition: A | Discharge status: Home / Self Care | Payer: BC Managed Care – PPO | Attending: Emergency Medicine | Admitting: Emergency Medicine

## 2019-11-27 ENCOUNTER — Encounter (HOSPITAL_COMMUNITY): Payer: Self-pay

## 2019-11-27 ENCOUNTER — Other Ambulatory Visit: Payer: Self-pay

## 2019-11-27 ENCOUNTER — Emergency Department (HOSPITAL_COMMUNITY): Payer: BC Managed Care – PPO

## 2019-11-27 DIAGNOSIS — F17210 Nicotine dependence, cigarettes, uncomplicated: Secondary | ICD-10-CM | POA: Diagnosis not present

## 2019-11-27 DIAGNOSIS — H538 Other visual disturbances: Secondary | ICD-10-CM | POA: Diagnosis not present

## 2019-11-27 DIAGNOSIS — G43809 Other migraine, not intractable, without status migrainosus: Secondary | ICD-10-CM | POA: Insufficient documentation

## 2019-11-27 DIAGNOSIS — R519 Headache, unspecified: Secondary | ICD-10-CM | POA: Diagnosis not present

## 2019-11-27 DIAGNOSIS — G453 Amaurosis fugax: Secondary | ICD-10-CM | POA: Diagnosis not present

## 2019-11-27 DIAGNOSIS — Z79899 Other long term (current) drug therapy: Secondary | ICD-10-CM | POA: Diagnosis not present

## 2019-11-27 DIAGNOSIS — I10 Essential (primary) hypertension: Secondary | ICD-10-CM | POA: Insufficient documentation

## 2019-11-27 LAB — I-STAT CHEM 8, ED
BUN: 17 mg/dL (ref 6–20)
Calcium, Ion: 1.15 mmol/L (ref 1.15–1.40)
Chloride: 102 mmol/L (ref 98–111)
Creatinine, Ser: 1.3 mg/dL — ABNORMAL HIGH (ref 0.44–1.00)
Glucose, Bld: 96 mg/dL (ref 70–99)
HCT: 45 % (ref 36.0–46.0)
Hemoglobin: 15.3 g/dL — ABNORMAL HIGH (ref 12.0–15.0)
Potassium: 4.9 mmol/L (ref 3.5–5.1)
Sodium: 139 mmol/L (ref 135–145)
TCO2: 29 mmol/L (ref 22–32)

## 2019-11-27 MED ORDER — GADOBUTROL 1 MMOL/ML IV SOLN
10.0000 mL | Freq: Once | INTRAVENOUS | Status: AC | PRN
  Administered 2019-11-27: 10 mL via INTRAVENOUS

## 2019-11-27 NOTE — Discharge Instructions (Signed)
Your MRI scans of the brain and neck did not show signs of stroke or blood clots.  I suspect your symptoms were related to a complicated migraine.  Please follow up with neurology - a referral was placed with them.

## 2019-11-27 NOTE — ED Provider Notes (Signed)
[facility] DEPT Provider Note   CSN: [id number] Arrival date & time: 11/27/19  1627     History CC:  Vision loss, headache  Amy Hernandez is a 54 y.o. female w/ hx of HTN, HLD, HIV, smoking, presenting to the ED with right eye vision loss episodes occurring for the past 3 days.  She reports an episode 3 days ago of gradual vision loss in her right eye, over the period of several minutes to hours.  She says "my vision got blurrier until I couldn't see anything, like a total curtain."  This lasted a few hours then gradually resolved, until her vision was back to normal.  She reports that with improvement of her vision, she began having a headache that was throbbing directly in her right temple and behind her right eye.  She has never had this kind of headache before, and does not have a prior hx of migraines.  There was no associated numbness, weakness, or vomiting with this.  She does report some photophobia in her right eye during the symptoms.  The vision loss again occurred yesterday in the same pattern.    She went to see Dr Manuella Ghazi an ophathlmologist who performed an office ocular exam and sent her to the ED for further evaluation.  Per the office papers from Dr Manuella Ghazi, the patient had a normal ocular and fundic exam, no report of retinal detachment, no clear stigmata of CRAO or CRVO reported on this assessment.  However, Dr Manuella Ghazi was concerned about TIA and recommended MR imaging and evaluation for this.  The patient denies personal or family hx of autoimmune disease or MS.   She denies any personal hx of stroke or TIA.  She reports multiple stressors in her life and poor sleep.  The patient is currently asymptomatic with no active symptoms.  HPI     Past Medical History:  Diagnosis Date  . Barrett's esophagus - short segment 09/04/2016  . Bartholin gland cyst   . Cervical intraepithelial neoplasia (CIN) 2005   high grade cervical dysplasia prior to total  hysterectomy - needs yearly vaginal paps   . GERD (gastroesophageal reflux disease)   . 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 HIV (human immunodeficiency virus infection) ([place])   . Hyperlipidemia   . Hypertension   . IBS (irritable bowel syndrome)   . Insomnia   . Substance abuse [facility])     Patient Active Problem List   Diagnosis Date Noted  . Abdominal distension (gaseous) 09/21/2019  . Abdominal pain, epigastric 09/21/2019  . GERD (gastroesophageal reflux disease) 10/11/2018  . Smoker 10/11/2018  . Nausea and vomiting 10/11/2018  . Obesity, Class II, BMI 35-39.9 10/11/2018  . Large breasts 11/10/2017  . Back muscle spasm 11/10/2017  . Need for immunization against influenza 11/10/2017  . Depression 12/12/2016  . Elevated alkaline phosphatase level 12/12/2016  . 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 Barrett's esophagus - short segment 09/04/2016  . Diarrhea 09/04/2016  . Weight gain 07/23/2016  . IBS (irritable bowel syndrome) 08/28/2014  . Insomnia 08/28/2014  . HLD (hyperlipidemia) 02/07/2009  . Human immunodeficiency virus (HIV) disease ([place]) 12/05/2005  . HTN (hypertension) 12/05/2005    Past Surgical History:  Procedure Laterality Date  . ABDOMINAL HYSTERECTOMY  09/2003  . ANAL SPHINCTEROTOMY    . CHOLECYSTECTOMY  1990  . COLONOSCOPY  2009   NL  . ESOPHAGOGASTRODUODENOSCOPY  2018  . LEEP  2000  history of CIN   . TUBAL LIGATION       OB History   No obstetric history on file.     Family History  Problem Relation Age of Onset  . Diabetes Mother   . Hyperlipidemia Mother   . Hypertension Mother   . Cancer Father        bladder cancer   . Heart disease Father   . Hypertension Father   . Diabetes Maternal Grandmother   . Hyperlipidemia Maternal Grandmother   . Colon cancer Neg Hx   . Esophageal cancer Neg Hx   . Rectal cancer Neg Hx   . Stomach cancer Neg Hx     Social History   Tobacco Use  .  Smoking status: Current Every Day Smoker    Packs/day: 0.25    Years: 30.00    Pack years: 7.50    Types: Cigarettes  . Smokeless tobacco: Never Used  . Tobacco comment:  pt. trying to quit  Vaping Use  . Vaping Use: Never used  Substance Use Topics  . Alcohol use: No    Alcohol/week: 0.0 standard drinks  . Drug use: No    Home Medications Prior to Admission medications   Medication Sig Start Date End Date Taking? Authorizing Provider  bictegravir-emtricitabine-tenofovir AF (BIKTARVY) 50-200-25 MG TABS tablet Take 1 tablet by mouth daily. 09/26/19   [place] Callas, NP  cholestyramine (QUESTRAN) 4 g packet Take 1 packet (4 g total) by mouth daily with supper. 11/02/19   Gatha Mayer, MD  dicyclomine (BENTYL) 20 MG tablet Take 1 tablet (20 mg total) by mouth 3 (three) times daily before meals. As needed 11/02/19   Gatha Mayer, MD  famotidine (PEPCID) 40 MG tablet TAKE 1 TABLET(40 MG) BY MOUTH AT BEDTIME 10/20/19   Gatha Mayer, MD  hyoscyamine (LEVSIN SL) 0.125 MG SL tablet Place 1 tablet (0.125 mg total) under the tongue every 6 (six) hours as needed. 09/21/19   Zehr, Janett Billow D, PA-C  lisinopril-hydrochlorothiazide (ZESTORETIC) 20-25 MG tablet TAKE 1 TABLET BY MOUTH DAILY 07/21/19   [place] Callas, NP  omeprazole (PRILOSEC) 40 MG capsule Take 1 capsule (40 mg total) by mouth daily. 09/29/19   Gatha Mayer, MD  ondansetron (ZOFRAN ODT) 4 MG disintegrating tablet Take 1 tablet (4 mg total) by mouth every 6 (six) hours as needed for nausea or vomiting. 09/21/19   Zehr, Laban Emperor, PA-C  potassium chloride SA (KLOR-CON) 20 MEQ tablet Take 1 tablet (20 mEq total) by mouth daily. 04/03/19   [place] Callas, NP  simvastatin (ZOCOR) 20 MG tablet TAKE 1 TABLET BY MOUTH DAILY 07/21/19   [place] Callas, NP  zolpidem (AMBIEN CR) 12.5 MG CR tablet TAKE 1 TABLET(12.5 MG) BY MOUTH AT BEDTIME AS NEEDED FOR SLEEP 10/20/19    Callas, NP    Allergies    Codeine  Review of  Systems   Review of Systems  Constitutional: Negative for chills and fever.  Eyes: Positive for photophobia and visual disturbance. Negative for discharge and itching.  Respiratory: Negative for cough and shortness of breath.   Cardiovascular: Negative for chest pain and palpitations.  Gastrointestinal: Negative for abdominal pain and vomiting.  Genitourinary: Negative for dysuria and hematuria.  Musculoskeletal: Negative for arthralgias and neck pain.  Skin: Negative for color change and rash.  Neurological: Positive for headaches. Negative for syncope, speech difficulty, weakness and numbness.  Psychiatric/Behavioral: Negative for agitation and confusion.  All other systems reviewed  and are negative.   Physical Exam Updated Vital Signs BP (!) 149/95 (BP Location: Left Arm)   Pulse 81   Temp 98.7 F (37.1 C) (Oral)   Resp 16   SpO2 98%   Physical Exam Vitals and nursing note reviewed.  Constitutional:      General: She is not in acute distress.    Appearance: She is well-developed.  HENT:     Head: Normocephalic and atraumatic.  Eyes:     Extraocular Movements: Extraocular movements intact.     Conjunctiva/sclera: Conjunctivae normal.     Pupils: Pupils are equal, round, and reactive to light.     Comments: Vision grossly intact  Cardiovascular:     Rate and Rhythm: Normal rate and regular rhythm.     Pulses: Normal pulses.  Pulmonary:     Effort: Pulmonary effort is normal. No respiratory distress.     Breath sounds: Normal breath sounds.  Musculoskeletal:     Cervical back: Neck supple.  Skin:    General: Skin is warm and dry.  Neurological:     General: No focal deficit present.     Mental Status: She is alert and oriented to person, place, and time.     Cranial Nerves: No cranial nerve deficit.     Sensory: No sensory deficit.     Gait: Gait normal.  Psychiatric:        Mood and Affect: Mood normal.        Behavior: Behavior normal.     ED Results /  Procedures / Treatments   Labs (all labs ordered are listed, but only abnormal results are displayed) Labs Reviewed  I-STAT CHEM 8, ED - Abnormal; Notable for the following components:      Result Value   Creatinine, Ser 1.30 (*)    Hemoglobin 15.3 (*)    All other components within normal limits    EKG None  Radiology MR ANGIO HEAD WO CONTRAST  Result Date: 11/27/2019 CLINICAL DATA:  Initial evaluation for transient visual loss to right eye, now resolved. EXAM: MRI HEAD WITHOUT CONTRAST MRA HEAD WITHOUT CONTRAST MRA NECK WITHOUT AND WITH CONTRAST TECHNIQUE: Multiplanar, multiecho pulse sequences of the brain and surrounding structures were obtained without intravenous contrast. Angiographic images of the Circle of Willis were obtained using MRA technique without intravenous contrast. Angiographic images of the neck were obtained using MRA technique without and with intravenous contrast. Carotid stenosis measurements (when applicable) are obtained utilizing NASCET criteria, using the distal internal carotid diameter as the denominator. CONTRAST:  94mL GADAVIST GADOBUTROL 1 MMOL/ML IV SOLN COMPARISON:  None available. FINDINGS: MRI HEAD FINDINGS Brain: Cerebral volume within normal limits for patient age. No focal parenchymal signal abnormality identified. No abnormal foci of restricted diffusion to suggest acute or subacute ischemia. Gray-white matter differentiation well maintained. No encephalomalacia to suggest chronic infarction. No foci of susceptibility artifact to suggest acute or chronic intracranial hemorrhage. No mass lesion, midline shift or mass effect. No hydrocephalus. No extra-axial fluid collection. Major dural sinuses are grossly patent. Pituitary gland and suprasellar region are normal. Midline structures intact and normal. Vascular: Major intracranial vascular flow voids well maintained and normal in appearance. Skull and upper cervical spine: Craniocervical junction normal.  Visualized upper cervical spine within normal limits. Bone marrow signal intensity normal. No scalp soft tissue abnormality. Sinuses/Orbits: Globes and orbital soft tissues within normal limits. Mild scattered mucosal thickening noted throughout the paranasal sinuses. No air-fluid level to suggest acute sinusitis. No mastoid effusion. [place]  ear structures grossly within normal limits. Other: None. MRA HEAD FINDINGS ANTERIOR CIRCULATION: Distal cervical segments of the internal carotid arteries are widely patent with symmetric antegrade flow. Petrous, cavernous, and supraclinoid ICAs widely patent without stenosis or other abnormality. Ophthalmic arteries patent bilaterally. A1 segments widely patent. Normal anterior communicating artery complex. Anterior cerebral arteries widely patent their distal aspects without stenosis. No M1 stenosis or occlusion. Normal MCA bifurcations. Distal MCA branches well perfused and symmetric. POSTERIOR CIRCULATION: Vertebral arteries widely patent to the vertebrobasilar junction without stenosis. Both PICAs patent. Basilar widely patent to its distal aspect without stenosis. Superior cerebral arteries patent bilaterally. Both PCAs primarily supplied via the basilar well perfused to their distal aspects. No intracranial aneurysm or other vascular abnormality. MRA NECK FINDINGS AORTIC ARCH: Examination degraded by motion artifact. Visualized aortic arch of normal caliber with normal branch pattern. No hemodynamically significant stenosis seen about the origin of the great vessels. RIGHT CAROTID SYSTEM: Right common carotid artery patent from its origin to the bifurcation without stenosis. No definite atheromatous stenosis seen about the right bifurcation allowing for motion. Right ICA patent distally without stenosis, occlusion, or findings of dissection. LEFT CAROTID SYSTEM: Left common carotid artery patent from its origin to the bifurcation without stenosis. No significant  atheromatous narrowing about the left bifurcation allowing for motion. Left ICA patent distally without stenosis, occlusion, or evidence for dissection. VERTEBRAL ARTERIES: Both vertebral arteries arise from subclavian arteries. Proximal left vertebral artery not well assessed due to motion. Visualized vertebral arteries widely patent without stenosis, occlusion, or evidence for dissection. IMPRESSION: 1. Normal brain MRI. No acute intracranial abnormality or findings to explain patient's symptoms identified. 2. Normal intracranial MRA. 3. Normal MRA of the neck allowing for motion artifact. Electronically Signed   By: Jeannine Boga M.D.   On: 11/27/2019 22:11   MR Angiogram Neck W or Wo Contrast  Result Date: 11/27/2019 CLINICAL DATA:  Initial evaluation for transient visual loss to right eye, now resolved. EXAM: MRI HEAD WITHOUT CONTRAST MRA HEAD WITHOUT CONTRAST MRA NECK WITHOUT AND WITH CONTRAST TECHNIQUE: Multiplanar, multiecho pulse sequences of the brain and surrounding structures were obtained without intravenous contrast. Angiographic images of the Circle of Willis were obtained using MRA technique without intravenous contrast. Angiographic images of the neck were obtained using MRA technique without and with intravenous contrast. Carotid stenosis measurements (when applicable) are obtained utilizing NASCET criteria, using the distal internal carotid diameter as the denominator. CONTRAST:  68mL GADAVIST GADOBUTROL 1 MMOL/ML IV SOLN COMPARISON:  None available. FINDINGS: MRI HEAD FINDINGS Brain: Cerebral volume within normal limits for patient age. No focal parenchymal signal abnormality identified. No abnormal foci of restricted diffusion to suggest acute or subacute ischemia. Gray-white matter differentiation well maintained. No encephalomalacia to suggest chronic infarction. No foci of susceptibility artifact to suggest acute or chronic intracranial hemorrhage. No mass lesion, midline shift or  mass effect. No hydrocephalus. No extra-axial fluid collection. Major dural sinuses are grossly patent. Pituitary gland and suprasellar region are normal. Midline structures intact and normal. Vascular: Major intracranial vascular flow voids well maintained and normal in appearance. Skull and upper cervical spine: Craniocervical junction normal. Visualized upper cervical spine within normal limits. Bone marrow signal intensity normal. No scalp soft tissue abnormality. Sinuses/Orbits: Globes and orbital soft tissues within normal limits. Mild scattered mucosal thickening noted throughout the paranasal sinuses. No air-fluid level to suggest acute sinusitis. No mastoid effusion. Inner ear structures grossly within normal limits. Other: None. MRA HEAD FINDINGS ANTERIOR CIRCULATION: Distal cervical  segments of the internal carotid arteries are widely patent with symmetric antegrade flow. Petrous, cavernous, and supraclinoid ICAs widely patent without stenosis or other abnormality. Ophthalmic arteries patent bilaterally. A1 segments widely patent. Normal anterior communicating artery complex. Anterior cerebral arteries widely patent their distal aspects without stenosis. No M1 stenosis or occlusion. Normal MCA bifurcations. Distal MCA branches well perfused and symmetric. POSTERIOR CIRCULATION: Vertebral arteries widely patent to the vertebrobasilar junction without stenosis. Both PICAs patent. Basilar widely patent to its distal aspect without stenosis. Superior cerebral arteries patent bilaterally. Both PCAs primarily supplied via the basilar well perfused to their distal aspects. No intracranial aneurysm or other vascular abnormality. MRA NECK FINDINGS AORTIC ARCH: Examination degraded by motion artifact. Visualized aortic arch of normal caliber with normal branch pattern. No hemodynamically significant stenosis seen about the origin of the great vessels. RIGHT CAROTID SYSTEM: Right common carotid artery patent from  its origin to the bifurcation without stenosis. No definite atheromatous stenosis seen about the right bifurcation allowing for motion. Right ICA patent distally without stenosis, occlusion, or findings of dissection. LEFT CAROTID SYSTEM: Left common carotid artery patent from its origin to the bifurcation without stenosis. No significant atheromatous narrowing about the left bifurcation allowing for motion. Left ICA patent distally without stenosis, occlusion, or evidence for dissection. VERTEBRAL ARTERIES: Both vertebral arteries arise from subclavian arteries. Proximal left vertebral artery not well assessed due to motion. Visualized vertebral arteries widely patent without stenosis, occlusion, or evidence for dissection. IMPRESSION: 1. Normal brain MRI. No acute intracranial abnormality or findings to explain patient's symptoms identified. 2. Normal intracranial MRA. 3. Normal MRA of the neck allowing for motion artifact. Electronically Signed   By: Jeannine Boga M.D.   On: 11/27/2019 22:11   MR BRAIN WO CONTRAST  Result Date: 11/27/2019 CLINICAL DATA:  Initial evaluation for transient visual loss to right eye, now resolved. EXAM: MRI HEAD WITHOUT CONTRAST MRA HEAD WITHOUT CONTRAST MRA NECK WITHOUT AND WITH CONTRAST TECHNIQUE: Multiplanar, multiecho pulse sequences of the brain and surrounding structures were obtained without intravenous contrast. Angiographic images of the Circle of Willis were obtained using MRA technique without intravenous contrast. Angiographic images of the neck were obtained using MRA technique without and with intravenous contrast. Carotid stenosis measurements (when applicable) are obtained utilizing NASCET criteria, using the distal internal carotid diameter as the denominator. CONTRAST:  100mL GADAVIST GADOBUTROL 1 MMOL/ML IV SOLN COMPARISON:  None available. FINDINGS: MRI HEAD FINDINGS Brain: Cerebral volume within normal limits for patient age. No focal parenchymal signal  abnormality identified. No abnormal foci of restricted diffusion to suggest acute or subacute ischemia. Gray-white matter differentiation well maintained. No encephalomalacia to suggest chronic infarction. No foci of susceptibility artifact to suggest acute or chronic intracranial hemorrhage. No mass lesion, midline shift or mass effect. No hydrocephalus. No extra-axial fluid collection. Major dural sinuses are grossly patent. Pituitary gland and suprasellar region are normal. Midline structures intact and normal. Vascular: Major intracranial vascular flow voids well maintained and normal in appearance. Skull and upper cervical spine: Craniocervical junction normal. Visualized upper cervical spine within normal limits. Bone marrow signal intensity normal. No scalp soft tissue abnormality. Sinuses/Orbits: Globes and orbital soft tissues within normal limits. Mild scattered mucosal thickening noted throughout the paranasal sinuses. No air-fluid level to suggest acute sinusitis. No mastoid effusion. Inner ear structures grossly within normal limits. Other: None. MRA HEAD FINDINGS ANTERIOR CIRCULATION: Distal cervical segments of the internal carotid arteries are widely patent with symmetric antegrade flow. Petrous, cavernous, and supraclinoid ICAs  widely patent without stenosis or other abnormality. Ophthalmic arteries patent bilaterally. A1 segments widely patent. Normal anterior communicating artery complex. Anterior cerebral arteries widely patent their distal aspects without stenosis. No M1 stenosis or occlusion. Normal MCA bifurcations. Distal MCA branches well perfused and symmetric. POSTERIOR CIRCULATION: Vertebral arteries widely patent to the vertebrobasilar junction without stenosis. Both PICAs patent. Basilar widely patent to its distal aspect without stenosis. Superior cerebral arteries patent bilaterally. Both PCAs primarily supplied via the basilar well perfused to their distal aspects. No intracranial  aneurysm or other vascular abnormality. MRA NECK FINDINGS AORTIC ARCH: Examination degraded by motion artifact. Visualized aortic arch of normal caliber with normal branch pattern. No hemodynamically significant stenosis seen about the origin of the great vessels. RIGHT CAROTID SYSTEM: Right common carotid artery patent from its origin to the bifurcation without stenosis. No definite atheromatous stenosis seen about the right bifurcation allowing for motion. Right ICA patent distally without stenosis, occlusion, or findings of dissection. LEFT CAROTID SYSTEM: Left common carotid artery patent from its origin to the bifurcation without stenosis. No significant atheromatous narrowing about the left bifurcation allowing for motion. Left ICA patent distally without stenosis, occlusion, or evidence for dissection. VERTEBRAL ARTERIES: Both vertebral arteries arise from subclavian arteries. Proximal left vertebral artery not well assessed due to motion. Visualized vertebral arteries widely patent without stenosis, occlusion, or evidence for dissection. IMPRESSION: 1. Normal brain MRI. No acute intracranial abnormality or findings to explain patient's symptoms identified. 2. Normal intracranial MRA. 3. Normal MRA of the neck allowing for motion artifact. Electronically Signed   By: Jeannine Boga M.D.   On: 11/27/2019 22:11    Procedures Ultrasound ED Ocular  Date/Time: 11/28/2019 11:30 AM Performed by: Wyvonnia Dusky, MD Authorized by: Wyvonnia Dusky, MD   PROCEDURE DETAILS:    Indications: visual change     Assessed:  Right eye   Right eye axial view: obtained     Right eye sagittal view: obtained     Images: archived     Limitations:  None RIGHT EYE FINDINGS:     no evidence of retinal detachment of the right eye    no vitreous hemorrhage in right eye   (including critical care time)  Medications Ordered in ED Medications  gadobutrol (GADAVIST) 1 MMOL/ML injection 10 mL (10 mLs  Intravenous Contrast Given 11/27/19 2141)    ED Course  I have reviewed the triage vital signs and the nursing notes.  Pertinent labs & imaging results that were available during my care of the patient were reviewed by me and considered in my medical decision making (see chart for details).  54 yo female presenting to ED with episodes of transient vision loss over the past 3 days, in her right eye only, associated with photophobia and subsequent headache behind her right eye.  The differential includes complex migraines as the most likely etiology given this description.  Retinal detachment is less likely with a normal ophtho exam in office today and no detachment seen on ultrasound here in the ED.  I cannot exclude the possibility of TIA - although her gradual onset and offset of symptoms, photophobia, and temporal headache make this less likely.  Given her stroke risk factors, I did feel it was prudent to obtain MR imaging of the brain and MRA of the neck to look for signs of TIA or carotid/vascular occlusions.  I discussed this with the patient who was agreeable for this workup.  The imaging was subsequently found to be  benign.  This history and exam was also not consistent to me for optic neuritis or MS.   There is no persistent vision loss or new symptoms.  I did not feel she needed emergent MR imaging of the orbits in the ER today. I did refer the patient to neurology for complex migraine management.  The patient was agreeable with this workup, and discharged in stable condition.   Clinical Course as of Nov 27 1133  Mon Nov 27, 2019  2215 IMPRESSION: 1. Normal brain MRI. No acute intracranial abnormality or findings to explain patient's symptoms identified. 2. Normal intracranial MRA. 3. Normal MRA of the neck allowing for motion artifact.    [MT]  2216 She remains asymptomatic, will discharge   [MT]    Clinical Course User Index [MT] Juandedios Dudash, Carola Rhine, MD    Final Clinical  Impression(s) / ED Diagnoses Final diagnoses:  Other migraine without status migrainosus, not intractable    Rx / DC Orders ED Discharge Orders         Ordered    Ambulatory referral to Neurology       Comments: An appointment is requested in approximately: 1 week Seen in ED for suspected new onset complex migraines with intermittent visual loss in right eye   11/27/19 2230           Wyvonnia Dusky, MD 11/28/19 1135

## 2019-11-27 NOTE — ED Triage Notes (Signed)
Pt states that starting Friday her vision in her right eye has been intermittently going out. Pt states the right eye goes completely black and she is not able to see.  When she is able to see out of the right eye she feels as though lights are very bright and she is seeing spots.

## 2019-11-28 ENCOUNTER — Encounter: Payer: Self-pay | Admitting: Neurology

## 2019-11-29 ENCOUNTER — Other Ambulatory Visit: Payer: Self-pay | Admitting: Infectious Diseases

## 2019-11-29 DIAGNOSIS — B20 Human immunodeficiency virus [HIV] disease: Secondary | ICD-10-CM

## 2019-11-30 ENCOUNTER — Other Ambulatory Visit: Payer: Self-pay

## 2019-11-30 ENCOUNTER — Other Ambulatory Visit: Payer: Self-pay | Admitting: Infectious Diseases

## 2019-12-04 MED FILL — BIKTARVY 50-200-25 MG TABS: 50-200-25 | 30 days supply | Qty: 30 | Fill #0

## 2019-12-19 ENCOUNTER — Telehealth: Payer: Self-pay

## 2019-12-19 NOTE — Telephone Encounter (Signed)
Patient called requesting to have all medications that Colletta Maryland prescribes transferred to [facility]. RN informed patient that she can call [facility] and give them [facility]' number to have them transferred. Patient verbalized understanding and has no further questions.  Beryle Flock, RN

## 2020-01-01 MED FILL — BIKTARVY 50-200-25 MG TABS: 50-200-25 | 30 days supply | Qty: 30 | Fill #1

## 2020-01-15 ENCOUNTER — Other Ambulatory Visit: Payer: Self-pay | Admitting: Infectious Diseases

## 2020-01-15 DIAGNOSIS — E785 Hyperlipidemia, unspecified: Secondary | ICD-10-CM

## 2020-01-15 DIAGNOSIS — I10 Essential (primary) hypertension: Secondary | ICD-10-CM

## 2020-01-15 DIAGNOSIS — G4700 Insomnia, unspecified: Secondary | ICD-10-CM

## 2020-01-15 MED ORDER — ZOLPIDEM TARTRATE ER 12.5 MG PO TBCR: EXTENDED_RELEASE_TABLET | ORAL | 0 refills | Status: DC

## 2020-01-15 MED ORDER — LISINOPRIL-HYDROCHLOROTHIAZIDE 20-25 MG PO TABS: 1.0000 | ORAL_TABLET | Freq: Every day | ORAL | 3 refills | Status: DC

## 2020-01-15 MED ORDER — SIMVASTATIN 20 MG PO TABS: 20.0000 mg | ORAL_TABLET | Freq: Every day | ORAL | 3 refills | Status: DC

## 2020-01-15 MED FILL — SIMVASTATIN 20 MG TABLET: 20 | 30 days supply | Qty: 30 | Fill #0

## 2020-01-15 MED FILL — ZOLPIDEM TART ER 12.5 MG TA: 12.5 | 30 days supply | Qty: 30 | Fill #0

## 2020-01-15 MED FILL — LISINOPRIL-HCTZ 20-25 MG TA: 20-25 | 30 days supply | Qty: 30 | Fill #0

## 2020-01-15 MED FILL — OMEPRAZOLE 40 MG CPDR: 40 | 30 days supply | Qty: 30 | Fill #0

## 2020-01-15 MED FILL — POTASSIUM CHLORIDE CRYS ER: 20 | 30 days supply | Qty: 30 | Fill #0

## 2020-01-15 MED FILL — FAMOTIDINE 40 MG TABS: 40 | 30 days supply | Qty: 30 | Fill #0

## 2020-02-07 NOTE — Progress Notes (Addendum)
NEUROLOGY CONSULTATION NOTE  Amy Hernandez MRN: [id number] DOB: 28-Jun-1965  Referring provider: Octaviano Glow, MD (ED referral) Primary care provider: No PCP  Reason for consult:  migraines   Subjective:  Amy Hernandez is a 55 year old right-handed female with HIV, HTN, HLD, Barrett's esophagus and IBS who presents for migraines.  History supplemented by ED note.  Over the summer, she had cysts removed from both eyes.  Afterwards, she started experiencing photosensitivity in the right eye.  In late October, she had an episode of gradual vision loss in her right eye described as black out of vision.  There may have been some specks of light.  It lasted about 7 to 11 minutes.  No associated headache, nausea, numbness or weakness.  It occurred off and on for the next 3 days..  She was evaluated by ophthalmology, Dr. Manuella Ghazi, on 11/27/2019 who found no abnormalities on exam but was advised to go to the ED for evaluation of possible TIAs.  Ocular ultrasound was negative. MRI of brain and MRA of head and neck personally reviewed were normal.  She was diagnosed with probable migraine.  Over the next several weeks, she would have similar episodes off and on for 3 days at a time.  It subsequently just stopped.  She reports no prior history of similar symptoms.  She denies prior history of headache.  PAST MEDICAL HISTORY: Past Medical History:  Diagnosis Date   Barrett's esophagus - short segment 09/04/2016   Bartholin gland cyst    Cervical intraepithelial neoplasia (CIN) 2005   high grade cervical dysplasia prior to total hysterectomy - needs yearly vaginal paps    GERD (gastroesophageal reflux disease)    Granular cell tumor - distal esophagus - smal 09/04/2016   HIV (human immunodeficiency virus infection) ([place])    Hyperlipidemia    Hypertension    IBS (irritable bowel syndrome)    Insomnia    Substance abuse ([place])     PAST SURGICAL HISTORY: Past Surgical History:  Procedure Laterality  Date   ABDOMINAL HYSTERECTOMY  09/2003   ANAL SPHINCTEROTOMY     CHOLECYSTECTOMY  1990   COLONOSCOPY  2009   NL   ESOPHAGOGASTRODUODENOSCOPY  2018   LEEP  2000   history of CIN    TUBAL LIGATION      MEDICATIONS: Current Outpatient Medications on File Prior to Visit  Medication Sig Dispense Refill   BIKTARVY 50-200-25 MG TABS tablet TAKE 1 TABLET BY MOUTH DAILY. TRY TO TAKE AT THE SAME TIME EACH DAY WITH OR WITHOUT FOOD. 30 tablet 5   cholestyramine (QUESTRAN) 4 g packet Take 1 packet (4 g total) by mouth daily with supper. 90 each 0   dicyclomine (BENTYL) 20 MG tablet Take 1 tablet (20 mg total) by mouth 3 (three) times daily before meals. As needed 90 tablet 2   famotidine (PEPCID) 40 MG tablet TAKE 1 TABLET(40 MG) BY MOUTH AT BEDTIME 90 tablet 1   hyoscyamine (LEVSIN SL) 0.125 MG SL tablet Place 1 tablet (0.125 mg total) under the tongue every 6 (six) hours as needed. 30 tablet 1   lisinopril-hydrochlorothiazide (ZESTORETIC) 20-25 MG tablet Take 1 tablet by mouth daily. 30 tablet 3   omeprazole (PRILOSEC) 40 MG capsule Take 1 capsule (40 mg total) by mouth daily. 90 capsule 3   ondansetron (ZOFRAN ODT) 4 MG disintegrating tablet Take 1 tablet (4 mg total) by mouth every 6 (six) hours as needed for nausea or vomiting. 30 tablet 1  potassium chloride SA (KLOR-CON) 20 MEQ tablet Take 1 tablet (20 mEq total) by mouth daily. 90 tablet 3   simvastatin (ZOCOR) 20 MG tablet Take 1 tablet (20 mg total) by mouth daily. 30 tablet 3   zolpidem (AMBIEN CR) 12.5 MG CR tablet TAKE 1 TABLET(12.5 MG) BY MOUTH AT BEDTIME AS NEEDED FOR SLEEP 90 tablet 0   No current facility-administered medications on file prior to visit.    ALLERGIES: Allergies  Allergen Reactions   Codeine     REACTION: hallucinations    FAMILY HISTORY: Family History  Problem Relation Age of Onset   Diabetes Mother    Hyperlipidemia Mother    Hypertension Mother    Cancer Father        bladder  cancer    Heart disease Father    Hypertension Father    Diabetes Maternal Grandmother    Hyperlipidemia Maternal Grandmother    Colon cancer Neg Hx    Esophageal cancer Neg Hx    Rectal cancer Neg Hx    Stomach cancer Neg Hx     SOCIAL HISTORY: Social History   Socioeconomic History   Marital status: Legally Separated    Spouse name: Not on file   Number of children: 2   Years of education: Not on file   Highest education level: Not on file  Occupational History   Occupation: Customer service rep    Employer: [employer]  Tobacco Use   Smoking status: Current Every Day Smoker    Packs/day: 0.25    Years: 30.00    Pack years: 7.50    Types: Cigarettes   Smokeless tobacco: Never Used   Tobacco comment:  pt. trying to quit  Vaping Use   Vaping Use: Never used  Substance and Sexual Activity   Alcohol use: No    Alcohol/week: 0.0 standard drinks   Drug use: No   Sexual activity: Yes    Partners: Male    Comment:  declined condoms  Other Topics Concern   Not on file  Social History Narrative   Married but legally separated, 2 daughters grandchild at home   Customer service rep for [employer]   No  alcohol or drug use   She is a smoker   Investment banker, operational of Radio broadcast assistant Strain: Not on file  Food Insecurity: Not on file  Transportation Needs: Not on file  Physical Activity: Not on file  Stress: Not on file  Social Connections: Not on file  Intimate Partner Violence: Not on file    Objective:  Blood pressure (!) 145/109, pulse 82, height 5\' 2"  (1.575 m), weight 204 lb (92.5 kg), SpO2 97 %. General: No acute distress.  Patient appears well-groomed.   Head:  Normocephalic/atraumatic Eyes:  fundi examined but not visualized Neck: supple, no paraspinal tenderness, full range of motion Back: No paraspinal tenderness Heart: regular rate and rhythm Lungs: Clear to auscultation bilaterally. Vascular: No carotid  bruits. Neurological Exam: Mental status: alert and oriented to person, place, and time, recent and remote memory intact, fund of knowledge intact, attention and concentration intact, speech fluent and not dysarthric, language intact. Cranial nerves: CN I: not tested CN II: pupils equal, round and reactive to light, visual fields intact CN III, IV, VI:  full range of motion, no nystagmus, no ptosis CN V: facial sensation intact. CN VII: upper and lower face symmetric CN VIII: hearing intact CN IX, X: gag intact, uvula midline CN XI: sternocleidomastoid and trapezius muscles intact  CN XII: tongue midline Bulk & Tone: normal, no fasciculations. Motor:  muscle strength 5/5 throughout Sensation:  Pinprick, temperature and vibratory sensation intact. Deep Tendon Reflexes:  2+ throughout,  toes downgoing.   Finger to nose testing:  Without dysmetria.   Heel to shin:  Without dysmetria.   Gait:  Normal station and stride.  Romberg negative.  Assessment/Plan:   Recurrent transient vision loss in right eye.  Unclear etiology.  Amaurosis fugax less likely as I would have suspected that she have a retinal artery occlusion with residual vision loss, as symptoms frequently occurred off and on over several weeks.  Migraine is possible.  She did demonstrate seeing specks of light but otherwise she only exhibited negative visual symptoms without headache and without history of headaches or migraines.    1.  I would check sed rate to evaluate for possible temporal arteritis.  If unremarkable, follow up as needed.  ADDENDUM:  Sed rate 36, which is not clinically significant to suggest temporal arteritis.  Thank you for allowing me to take part in the care of this patient.  Metta Clines, DO

## 2020-02-08 ENCOUNTER — Other Ambulatory Visit: Payer: Self-pay

## 2020-02-08 ENCOUNTER — Other Ambulatory Visit (INDEPENDENT_AMBULATORY_CARE_PROVIDER_SITE_OTHER): Payer: BC Managed Care – PPO

## 2020-02-08 ENCOUNTER — Encounter: Payer: Self-pay | Admitting: Neurology

## 2020-02-08 ENCOUNTER — Ambulatory Visit (INDEPENDENT_AMBULATORY_CARE_PROVIDER_SITE_OTHER): Payer: BC Managed Care – PPO | Admitting: Neurology

## 2020-02-08 VITALS — BP 145/109 | HR 82 | Ht 62.0 in | Wt 204.0 lb

## 2020-02-08 DIAGNOSIS — H5461 Unqualified visual loss, right eye, normal vision left eye: Secondary | ICD-10-CM

## 2020-02-08 LAB — SEDIMENTATION RATE: Sed Rate: 36 mm/hr — ABNORMAL HIGH (ref 0–30)

## 2020-02-08 NOTE — Patient Instructions (Signed)
1.  We will check a sed rate.  Further recommendations pending results.

## 2020-02-09 NOTE — Progress Notes (Signed)
Pt advised of her lab results.

## 2020-02-14 MED FILL — BIKTARVY 50-200-25 MG TABS: 50-200-25 | 30 days supply | Qty: 30 | Fill #2

## 2020-02-15 MED FILL — ZOLPIDEM TART ER 12.5 MG TA: 12.5 | 30 days supply | Qty: 30 | Fill #1

## 2020-02-15 MED FILL — LISINOPRIL-HCTZ 20-25 MG TA: 20-25 | 30 days supply | Qty: 30 | Fill #1

## 2020-02-15 MED FILL — POTASSIUM CHLORIDE CRYS ER: 20 | 30 days supply | Qty: 30 | Fill #1

## 2020-02-15 MED FILL — FAMOTIDINE 40 MG TABS: 40 | 30 days supply | Qty: 30 | Fill #1

## 2020-02-15 MED FILL — OMEPRAZOLE 40 MG CPDR: 40 | 30 days supply | Qty: 30 | Fill #1

## 2020-02-15 MED FILL — SIMVASTATIN 20 MG TABLET: 20 | 30 days supply | Qty: 30 | Fill #1

## 2020-03-14 MED FILL — LISINOPRIL-HCTZ 20-25 MG TA: 20-25 | 30 days supply | Qty: 30 | Fill #2

## 2020-03-14 MED FILL — BIKTARVY 50-200-25 MG TABS: 50-200-25 | 30 days supply | Qty: 30 | Fill #3

## 2020-03-14 MED FILL — POTASSIUM CHLORIDE CRYS ER: 20 | 30 days supply | Qty: 30 | Fill #2

## 2020-03-14 MED FILL — OMEPRAZOLE 40 MG CPDR: 40 | 30 days supply | Qty: 30 | Fill #2

## 2020-03-14 MED FILL — FAMOTIDINE 40 MG TABS: 40 | 30 days supply | Qty: 30 | Fill #2

## 2020-03-14 MED FILL — SIMVASTATIN 20 MG TABLET: 20 | 30 days supply | Qty: 30 | Fill #2

## 2020-03-14 MED FILL — ZOLPIDEM TART ER 12.5 MG TA: 12.5 | 30 days supply | Qty: 30 | Fill #2

## 2020-04-15 MED FILL — BIKTARVY 50-200-25 MG TABS: 50-200-25 | 30 days supply | Qty: 30 | Fill #4

## 2020-04-16 MED FILL — FAMOTIDINE 40 MG TABS: 40 | 30 days supply | Qty: 30 | Fill #3

## 2020-04-16 MED FILL — LISINOPRIL-HCTZ 20-25 MG TA: 20-25 | 30 days supply | Qty: 30 | Fill #3

## 2020-04-16 MED FILL — SIMVASTATIN 20 MG TABLET: 20 | 30 days supply | Qty: 30 | Fill #3

## 2020-04-16 MED FILL — OMEPRAZOLE 40 MG CPDR: 40 | 30 days supply | Qty: 30 | Fill #3

## 2020-04-18 ENCOUNTER — Other Ambulatory Visit: Payer: Self-pay

## 2020-04-18 ENCOUNTER — Telehealth: Payer: Self-pay | Admitting: *Deleted

## 2020-04-18 DIAGNOSIS — G4700 Insomnia, unspecified: Secondary | ICD-10-CM

## 2020-04-18 NOTE — Telephone Encounter (Signed)
Received faxed refill request for potassium chloride 20 meq and ambien CR 12.5 mg from [facility]. Patient just received her last fill, this is an advanced request from the pharmacy.  Will send to provider, as patient has her visit scheduled for 4/12, before next fill of both are due. Landis Gandy, RN

## 2020-04-18 NOTE — Telephone Encounter (Signed)
Ok to fill potassium. I want her to drop the dose of the ambien. Do not fill that one please

## 2020-04-19 ENCOUNTER — Other Ambulatory Visit: Payer: Self-pay | Admitting: Infectious Diseases

## 2020-04-19 DIAGNOSIS — G4700 Insomnia, unspecified: Secondary | ICD-10-CM

## 2020-04-19 MED ORDER — ZOLPIDEM TARTRATE ER 6.25 MG PO TBCR: EXTENDED_RELEASE_TABLET | ORAL | 2 refills | Status: DC

## 2020-04-19 MED FILL — ZOLPIDEM TART ER 6.25 MG TA: 6.25 | 30 days supply | Qty: 30 | Fill #0

## 2020-04-19 NOTE — Telephone Encounter (Signed)
Reducing the dose to 6.25 mg QHS- I spoke with her on MyChart about this and will follow up with her at our next appt. Also offered sleep medicine evaluation.

## 2020-04-23 ENCOUNTER — Other Ambulatory Visit: Payer: Self-pay | Admitting: *Deleted

## 2020-04-23 DIAGNOSIS — K580 Irritable bowel syndrome with diarrhea: Secondary | ICD-10-CM

## 2020-04-23 MED ORDER — POTASSIUM CHLORIDE CRYS ER 20 MEQ PO TBCR: 20.0000 meq | EXTENDED_RELEASE_TABLET | Freq: Every day | ORAL | 1 refills | Status: DC

## 2020-04-23 MED FILL — POTASSIUM CHLORIDE CRYS ER: 20 | 30 days supply | Qty: 30 | Fill #0

## 2020-04-23 NOTE — Telephone Encounter (Signed)
Refilled potassium supplements. Thanks!

## 2020-04-25 ENCOUNTER — Other Ambulatory Visit (HOSPITAL_COMMUNITY): Payer: Self-pay

## 2020-05-06 NOTE — Assessment & Plan Note (Signed)
Decreased ambien to 6.25 mg ER and sleeping only 4 hours a night now. Long term user over the last several years. Referral recently sent to sleep medicine with [facility] to help with evaluation/work up for organic causes of sleep disorder and hopefully some assistance with medication dependency.

## 2020-05-06 NOTE — Progress Notes (Signed)
Patient Name: Amy Hernandez  Date of Birth: [birth year] MRN: [id number]  PCP: Patient, No Pcp Per (Inactive)     SUBJECTIVE:  Brief Narrative:  Amy Hernandez is a 55 y.o. female with well controlled HIV on Biktarvy. HIV Risk: heterosexual, OI Hx: none  Previous Regimens:   Atripla - well controlled but required switch d/t insurance   Odefsey - did not like s/e or food requirement   Biktarvy 06/2017 - suppressed   Genotype:   sensitive    CC:  Chief Complaint  Patient presents with  . Follow-up    Declined condoms; reports cold for about 1 week;     HPI: Amy Hernandez is here for routine follow-up care.  She states that Monday she caught a cold from her grandbaby. Having a lot of phlegm up now from chest. No fevers or chills.  Associated symptoms include hoarseness, watery eyes, nasal congestion.  She sleeps with her windows open at night and always has done this as she is intolerant of any hot temperatures.   Alternating between Tylenol cold and sinus and Mucinex currently with improvement.  Taking her Biktarvy everyday without missed doses. No troubling side effects or access.   Decreased ambien to 6.25 mg ER and sleeping only 4 hours a night now. Long term user over the last several years. First noticed sleep problems after hysterectomy many years ago.   Right cyst has returned on the right eye - needs to make a follow up visit with them.     Review of Systems  Constitutional: Negative for chills, fever, malaise/fatigue and weight loss.  HENT: Positive for congestion. Negative for sore throat.   Eyes: Positive for discharge.  Respiratory: Positive for cough and wheezing. Negative for sputum production and shortness of breath.   Cardiovascular: Negative for chest pain, orthopnea and leg swelling.  Gastrointestinal: Negative for abdominal pain, diarrhea and vomiting.  Genitourinary: Negative for dysuria and flank pain.  Musculoskeletal: Negative for joint pain, myalgias and neck  pain.  Skin: Negative for rash.  Neurological: Negative for dizziness, tingling and headaches.  Psychiatric/Behavioral: Negative for depression and substance abuse. The patient has insomnia. The patient is not nervous/anxious.     Past Medical History:  Diagnosis Date  . Barrett's esophagus - short segment 09/04/2016  . Bartholin gland cyst   . Cervical intraepithelial neoplasia (CIN) 2005   high grade cervical dysplasia prior to total hysterectomy - needs yearly vaginal paps   . GERD (gastroesophageal reflux disease)   . 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 HIV (human immunodeficiency virus infection) ([place])   . Hyperlipidemia   . Hypertension   . IBS (irritable bowel syndrome)   . Insomnia   . Substance abuse [facility])    Outpatient Medications Prior to Visit  Medication Sig Dispense Refill  . bictegravir-emtricitabine-tenofovir AF (BIKTARVY) 50-200-25 MG TABS tablet TAKE 1 TABLET BY MOUTH DAILY. TRY TO TAKE AT THE SAME TIME EACH DAY WITH OR WITHOUT FOOD. 30 tablet 5  . lisinopril-hydrochlorothiazide (ZESTORETIC) 20-25 MG tablet TAKE 1 TABLET BY MOUTH DAILY. 30 tablet 3  . omeprazole (PRILOSEC) 40 MG capsule TAKE 1 CAPSULE BY MOUTH ONCE A DAY 90 capsule 2  . simvastatin (ZOCOR) 20 MG tablet TAKE 1 TABLET BY MOUTH ONCE A DAY 30 tablet 3  . zolpidem (AMBIEN CR) 6.25 MG CR tablet TAKE 1 TABLET(12.5 MG) BY MOUTH AT BEDTIME AS NEEDED FOR SLEEP 30 tablet 2  . hyoscyamine (LEVSIN SL) 0.125 MG SL tablet  Place 1 tablet (0.125 mg total) under the tongue every 6 (six) hours as needed. (Patient not taking: Reported on 02/08/2020) 30 tablet 1  . ondansetron (ZOFRAN ODT) 4 MG disintegrating tablet Take 1 tablet (4 mg total) by mouth every 6 (six) hours as needed for nausea or vomiting. 30 tablet 1  . potassium chloride SA (KLOR-CON) 20 MEQ tablet TAKE 1 TABLET BY MOUTH ONCE DAILY 90 tablet 1  . famotidine (PEPCID) 40 MG tablet TAKE 1 TABLET BY MOUTH AT BEDTIME 120 tablet 0   No  facility-administered medications prior to visit.    Allergies  Allergen Reactions  . Codeine     REACTION: hallucinations    Physical Assessment & Diagnostic Findings: Vitals:   05/07/20 0928  BP: 114/78  Pulse: 94  Weight: 204 lb (92.5 kg)   Body mass index is 37.31 kg/m.   Physical Exam Vitals reviewed.  Constitutional:      General: She is not in acute distress.    Appearance: She is well-developed.     Comments: Seated comfortably in chair.   HENT:     Nose: Congestion present. No rhinorrhea.     Mouth/Throat:     Mouth: Mucous membranes are moist. No oral lesions.     Dentition: Normal dentition. No dental abscesses.     Pharynx: Oropharynx is clear. No oropharyngeal exudate.  Eyes:     Conjunctiva/sclera: Conjunctivae normal.     Comments: Small cyst noted on the lower right eyelid.  Cardiovascular:     Rate and Rhythm: Normal rate and regular rhythm.     Heart sounds: Normal heart sounds.  Pulmonary:     Effort: Pulmonary effort is normal.     Breath sounds: Normal breath sounds. No wheezing or rales.  Abdominal:     General: There is no distension.     Palpations: Abdomen is soft.     Tenderness: There is no abdominal tenderness.  Musculoskeletal:     Cervical back: Normal range of motion.  Lymphadenopathy:     Cervical: No cervical adenopathy.  Skin:    General: Skin is warm and dry.     Capillary Refill: Capillary refill takes less than 2 seconds.     Findings: No rash.  Neurological:     Mental Status: She is alert and oriented to person, place, and time.  Psychiatric:        Judgment: Judgment normal.     Comments: In good spirits today and engaged in care discussion    Deferred as our visit was spent in direct counseling    Lab Results Lab Results  Component Value Date   CREATININE 1.24 (H) 05/07/2020   BUN 14 05/07/2020   NA 137 05/07/2020   K 3.2 (L) 05/07/2020   CL 100 05/07/2020   CO2 26 05/07/2020    Lab Results  Component  Value Date   ALT 36 (H) 05/07/2020   AST 66 (H) 05/07/2020   ALKPHOS 78 09/21/2019   BILITOT 0.3 05/07/2020    Lab Results  Component Value Date   CHOL 173 04/03/2019   HDL 40 (L) 04/03/2019   LDLCALC 104 (H) 04/03/2019   TRIG 172 (H) 04/03/2019   CHOLHDL 4.3 04/03/2019   HIV 1 RNA Quant (copies/mL)  Date Value  04/03/2019 <20 NOT DETECTED  06/28/2018 <20 NOT DETECTED  10/25/2017 <20 NOT DETECTED   CD4 T Cell Abs (/uL)  Date Value  04/03/2019 1,302  06/28/2018 1,298  10/25/2017 1,430   Problem  List Items Addressed This Visit      Unprioritized   Weight gain    Wt Readings from Last 3 Encounters:  05/07/20 204 lb (92.5 kg)  02/08/20 204 lb (92.5 kg)  11/14/19 209 lb (94.8 kg)   Stable since 2021 actually decreased by 5 pounds.      RESOLVED: Large breasts   Insomnia    Decreased ambien to 6.25 mg ER and sleeping only 4 hours a night now. Long term user over the last several years. Referral recently sent to sleep medicine with [facility] to help with evaluation/work up for organic causes of sleep disorder and hopefully some assistance with medication dependency.       Human immunodeficiency virus (HIV) disease ([place]) - Primary (Chronic)    Amy Hernandez has perfectly controlled HIV and has for many years.  She continues to take her Biktarvy once daily correctly.  No drug interactions noted. We will update pertinent lab work including CD4 count, viral load, BMP, CBC.  Will check fasting lipids at another time given she is had food today. Pap smear and mammogram due later this year in September/October - last pap normal cytology -HPV.  I would love to see her stop smoking. Return in about 6 months (around 11/06/2020).       Relevant Orders   HIV-1 RNA quant-no reflex-bld   T-helper cell (CD4)- ([facility] clinic only)   COMPLETE METABOLIC PANEL WITH GFR (Completed)   CBC with Differential/Platelet (Completed)   GERD (gastroesophageal reflux disease)    She  states that her symptoms have been under better control lately.  History of Barrett's esophagus and due for an appointment with Dr. Carlean Purl.      Cough    Acute onset of about a week.  Either viral or allergic.  I do suspect both are contributing.  Continue supportive care with symptom relievers, fluids, rest, and over-the-counter antihistamines and Flonase. Claritin in AM and Benadryl in PM. No concerning features of secondary bacterial illness contributing.          [name], MSN, NP-C [facility] [place] [facility] Pager: [phone]

## 2020-05-06 NOTE — Patient Instructions (Addendum)
For your coughing -  Will send in Tessalon pearls and give you an inhaler to use as needed for feelings of tightness or wheezing.   Start taking Flonase everyday -  Start taking one of the over the counter antihistamines everyday - Claritin, Zyrtec, Allegra. Any of these help. Generic version is perfect.  Can use benadryl at night to help 25-50 mg once a night.   Would continue your Biktarvy once a day everyday like you have been.   Will look into the Neurology referral for sleep medicine.   Looks like you are due for a follow up with Dr. Carlean Purl - give their office a call to schedule.   Schedule a visit with your eye doctor as well.

## 2020-05-07 ENCOUNTER — Other Ambulatory Visit: Payer: Self-pay | Admitting: Internal Medicine

## 2020-05-07 ENCOUNTER — Other Ambulatory Visit (HOSPITAL_COMMUNITY): Payer: Self-pay

## 2020-05-07 ENCOUNTER — Other Ambulatory Visit: Payer: Self-pay | Admitting: Infectious Diseases

## 2020-05-07 ENCOUNTER — Other Ambulatory Visit: Payer: Self-pay

## 2020-05-07 ENCOUNTER — Ambulatory Visit (INDEPENDENT_AMBULATORY_CARE_PROVIDER_SITE_OTHER): Payer: BC Managed Care – PPO | Admitting: Infectious Diseases

## 2020-05-07 ENCOUNTER — Encounter: Payer: Self-pay | Admitting: Infectious Diseases

## 2020-05-07 VITALS — BP 114/78 | HR 94 | Wt 204.0 lb

## 2020-05-07 DIAGNOSIS — R635 Abnormal weight gain: Secondary | ICD-10-CM | POA: Diagnosis not present

## 2020-05-07 DIAGNOSIS — B20 Human immunodeficiency virus [HIV] disease: Secondary | ICD-10-CM | POA: Diagnosis not present

## 2020-05-07 DIAGNOSIS — N62 Hypertrophy of breast: Secondary | ICD-10-CM

## 2020-05-07 DIAGNOSIS — F5101 Primary insomnia: Secondary | ICD-10-CM | POA: Diagnosis not present

## 2020-05-07 DIAGNOSIS — I10 Essential (primary) hypertension: Secondary | ICD-10-CM

## 2020-05-07 DIAGNOSIS — K219 Gastro-esophageal reflux disease without esophagitis: Secondary | ICD-10-CM

## 2020-05-07 DIAGNOSIS — E785 Hyperlipidemia, unspecified: Secondary | ICD-10-CM

## 2020-05-07 DIAGNOSIS — R059 Cough, unspecified: Secondary | ICD-10-CM

## 2020-05-07 MED ORDER — FAMOTIDINE 40 MG PO TABS
ORAL_TABLET | Freq: Every day | ORAL | 0 refills | Status: DC
  Filled 2020-05-16: qty 30, 30d supply, fill #0

## 2020-05-07 MED ORDER — ALBUTEROL SULFATE HFA 108 (90 BASE) MCG/ACT IN AERS
1.0000 | INHALATION_SPRAY | Freq: Four times a day (QID) | RESPIRATORY_TRACT | 3 refills | Status: AC | PRN
  Filled 2020-05-07: qty 8.5, 30d supply, fill #0
  Filled 2020-06-10: qty 8.5, 30d supply, fill #1
  Filled 2020-07-12: qty 8.5, 30d supply, fill #2
  Filled 2020-08-09: qty 8.5, 30d supply, fill #3

## 2020-05-07 MED ORDER — BENZONATATE 100 MG PO CAPS
100.0000 mg | ORAL_CAPSULE | Freq: Four times a day (QID) | ORAL | 0 refills | Status: DC | PRN
  Filled 2020-05-07: qty 30, 8d supply, fill #0

## 2020-05-07 MED FILL — Omeprazole Cap Delayed Release 40 MG: ORAL | 30 days supply | Qty: 30 | Fill #0 | Status: AC

## 2020-05-07 MED FILL — Bictegravir-Emtricitabine-Tenofovir AF Tab 50-200-25 MG: ORAL | 30 days supply | Qty: 30 | Fill #0 | Status: AC

## 2020-05-07 MED FILL — Zolpidem Tartrate Tab ER 6.25 MG: ORAL | 30 days supply | Qty: 30 | Fill #0 | Status: AC

## 2020-05-07 MED FILL — Potassium Chloride Microencapsulated Crys ER Tab 20 mEq: ORAL | 30 days supply | Qty: 30 | Fill #0 | Status: AC

## 2020-05-08 LAB — T-HELPER CELL (CD4) - (RCID CLINIC ONLY)
CD4 % Helper T Cell: 33 % (ref 33–65)
CD4 T Cell Abs: 1243 /uL (ref 400–1790)

## 2020-05-08 MED ORDER — LISINOPRIL-HYDROCHLOROTHIAZIDE 20-25 MG PO TABS
1.0000 | ORAL_TABLET | Freq: Every day | ORAL | 3 refills | Status: DC
  Filled 2020-05-16: qty 30, 30d supply, fill #0
  Filled 2020-06-10: qty 30, 30d supply, fill #1
  Filled 2020-07-12: qty 30, 30d supply, fill #2
  Filled 2020-08-09: qty 30, 30d supply, fill #3

## 2020-05-08 MED ORDER — SIMVASTATIN 20 MG PO TABS
ORAL_TABLET | Freq: Every day | ORAL | 3 refills | Status: DC
  Filled 2020-05-16: qty 30, 30d supply, fill #0
  Filled 2020-06-10: qty 30, 30d supply, fill #1
  Filled 2020-07-12: qty 30, 30d supply, fill #2
  Filled 2020-08-09: qty 30, 30d supply, fill #3

## 2020-05-08 NOTE — Assessment & Plan Note (Signed)
She states that her symptoms have been under better control lately.  History of Barrett's esophagus and due for an appointment with Dr. Carlean Purl.

## 2020-05-08 NOTE — Assessment & Plan Note (Signed)
Amy Hernandez has perfectly controlled HIV and has for many years.  She continues to take her Biktarvy once daily correctly.  No drug interactions noted. We will update pertinent lab work including CD4 count, viral load, BMP, CBC.  Will check fasting lipids at another time given she is had food today. Pap smear and mammogram due later this year in September/October - last pap normal cytology -HPV.  I would love to see her stop smoking. Return in about 6 months (around 11/06/2020).

## 2020-05-08 NOTE — Assessment & Plan Note (Signed)
Wt Readings from Last 3 Encounters:  05/07/20 204 lb (92.5 kg)  02/08/20 204 lb (92.5 kg)  11/14/19 209 lb (94.8 kg)   Stable since 2021 actually decreased by 5 pounds.

## 2020-05-08 NOTE — Assessment & Plan Note (Signed)
Acute onset of about a week.  Either viral or allergic.  I do suspect both are contributing.  Continue supportive care with symptom relievers, fluids, rest, and over-the-counter antihistamines and Flonase. Claritin in AM and Benadryl in PM. No concerning features of secondary bacterial illness contributing.

## 2020-05-09 ENCOUNTER — Other Ambulatory Visit: Payer: Self-pay | Admitting: Infectious Diseases

## 2020-05-09 DIAGNOSIS — R7989 Other specified abnormal findings of blood chemistry: Secondary | ICD-10-CM

## 2020-05-09 DIAGNOSIS — R945 Abnormal results of liver function studies: Secondary | ICD-10-CM

## 2020-05-10 LAB — CBC WITH DIFFERENTIAL/PLATELET
Absolute Monocytes: 505 cells/uL (ref 200–950)
Basophils Absolute: 50 cells/uL (ref 0–200)
Basophils Relative: 0.5 %
Eosinophils Absolute: 188 cells/uL (ref 15–500)
Eosinophils Relative: 1.9 %
HCT: 43.5 % (ref 35.0–45.0)
Hemoglobin: 14.5 g/dL (ref 11.7–15.5)
Lymphs Abs: 3861 cells/uL (ref 850–3900)
MCH: 31 pg (ref 27.0–33.0)
MCHC: 33.3 g/dL (ref 32.0–36.0)
MCV: 92.9 fL (ref 80.0–100.0)
MPV: 11 fL (ref 7.5–12.5)
Monocytes Relative: 5.1 %
Neutro Abs: 5297 cells/uL (ref 1500–7800)
Neutrophils Relative %: 53.5 %
Platelets: 279 10*3/uL (ref 140–400)
RBC: 4.68 10*6/uL (ref 3.80–5.10)
RDW: 12.3 % (ref 11.0–15.0)
Total Lymphocyte: 39 %
WBC: 9.9 10*3/uL (ref 3.8–10.8)

## 2020-05-10 LAB — COMPLETE METABOLIC PANEL WITH GFR
AG Ratio: 1.4 (calc) (ref 1.0–2.5)
ALT: 36 U/L — ABNORMAL HIGH (ref 6–29)
AST: 66 U/L — ABNORMAL HIGH (ref 10–35)
Albumin: 4.2 g/dL (ref 3.6–5.1)
Alkaline phosphatase (APISO): 104 U/L (ref 37–153)
BUN/Creatinine Ratio: 11 (calc) (ref 6–22)
BUN: 14 mg/dL (ref 7–25)
CO2: 26 mmol/L (ref 20–32)
Calcium: 9.6 mg/dL (ref 8.6–10.4)
Chloride: 100 mmol/L (ref 98–110)
Creat: 1.24 mg/dL — ABNORMAL HIGH (ref 0.50–1.05)
GFR, Est African American: 57 mL/min/{1.73_m2} — ABNORMAL LOW (ref >=60)
GFR, Est Non African American: 50 mL/min/{1.73_m2} — ABNORMAL LOW (ref >=60)
Globulin: 2.9 g/dL (calc) (ref 1.9–3.7)
Glucose, Bld: 111 mg/dL — ABNORMAL HIGH (ref 65–99)
Potassium: 3.2 mmol/L — ABNORMAL LOW (ref 3.5–5.3)
Sodium: 137 mmol/L (ref 135–146)
Total Bilirubin: 0.3 mg/dL (ref 0.2–1.2)
Total Protein: 7.1 g/dL (ref 6.1–8.1)

## 2020-05-10 LAB — HIV-1 RNA QUANT-NO REFLEX-BLD
HIV 1 RNA Quant: NOT DETECTED Copies/mL
HIV-1 RNA Quant, Log: NOT DETECTED Log cps/mL

## 2020-05-13 ENCOUNTER — Telehealth: Payer: Self-pay

## 2020-05-13 NOTE — Telephone Encounter (Signed)
-----   Message from [place] Callas, NP sent at 05/13/2020  9:50 AM EDT ----- Please call Amy Hernandez to let her know her viral load is undetectable and immune system continues to look strong.   I would like her to come back in 1 month to repeat her kidney and liver function tests (CMP).  There was some mild inflammation the last time that Is new. We need more information to see where that trends.   I sent her instructions about her low potassium as well in a previous message.   In the mean time drink plenty of water, use as little Tylenol / NSAID products (aspirin, ibuprofen, naproxen) as she can.

## 2020-05-13 NOTE — Telephone Encounter (Signed)
It's in the result note I believe on the cmp.  Unless it didn't attach for some reason.  Will send a separate one

## 2020-05-13 NOTE — Telephone Encounter (Signed)
RN spoke with patient to relay to her per [name], NP that her viral load is undetectable and her immune system continues to look strong.   Scheduled patient for lab appointment on 06/12/20 for repeat CMP. Advised patient that Colletta Maryland would like to keep an eye on her kidney and liver function, as her most recent labs showed some mild inflammation.   Advised patient that Colletta Maryland would like for her to drink plenty of water and reduce Tylenol and NSAID use. Patient verbalized understanding and has no further questions.   Beryle Flock, RN

## 2020-05-16 ENCOUNTER — Other Ambulatory Visit (HOSPITAL_COMMUNITY): Payer: Self-pay

## 2020-05-17 ENCOUNTER — Other Ambulatory Visit (HOSPITAL_COMMUNITY): Payer: Self-pay

## 2020-05-20 ENCOUNTER — Ambulatory Visit
Admission: EM | Admit: 2020-05-20 | Discharge: 2020-05-20 | Disposition: A | Discharge status: Home / Self Care | Payer: BC Managed Care – PPO | Attending: Emergency Medicine | Admitting: Emergency Medicine

## 2020-05-20 ENCOUNTER — Other Ambulatory Visit: Payer: Self-pay

## 2020-05-20 DIAGNOSIS — H6121 Impacted cerumen, right ear: Secondary | ICD-10-CM | POA: Diagnosis not present

## 2020-05-20 MED ORDER — BENZONATATE 200 MG PO CAPS: 200.0000 mg | ORAL_CAPSULE | Freq: Three times a day (TID) | ORAL | 0 refills | Status: AC | PRN

## 2020-05-20 MED ORDER — FLUTICASONE PROPIONATE 50 MCG/ACT NA SUSP
1.0000 | Freq: Every day | NASAL | 0 refills | Status: DC
Start: 2020-05-20 — End: 2021-05-16

## 2020-05-20 NOTE — ED Triage Notes (Signed)
Pt c/o rt ear fullness off and on for the past 2 wks and worsen yesterday. States used ear wax removal x3 last night and feels like it is worse. C/o rt ear muffled and ringing.

## 2020-05-20 NOTE — ED Provider Notes (Signed)
[name]    CSN: [id number] Arrival date & time: 05/20/20  1616      History   Chief Complaint Chief Complaint  Patient presents with  . Ear Fullness    HPI Amy Hernandez is a 55 y.o. female history of IBS, hypertension, hyperlipidemia, HIV, GERD, presenting today for evaluation of ear fullness.  Reports approximately 2 to 3 weeks ago developed cold symptoms which have improved, continues to have a slight lingering cough.  Denies chest pain or shortness of breath.  Denies fevers.  Recently she has had worsening ear fullness especially on the right side.  Has it been intermittent over the past 2 weeks.  Reports chronic decreased hearing on left.  HPI  Past Medical History:  Diagnosis Date  . Barrett's esophagus - short segment 09/04/2016  . Bartholin gland cyst   . Cervical intraepithelial neoplasia (CIN) 2005   high grade cervical dysplasia prior to total hysterectomy - needs yearly vaginal paps   . GERD (gastroesophageal reflux disease)   . 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 HIV (human immunodeficiency virus infection) ([place])   . Hyperlipidemia   . Hypertension   . IBS (irritable bowel syndrome)   . Insomnia   . Substance abuse [facility] [place])     Patient Active Problem List   Diagnosis Date Noted  . Abdominal distension (gaseous) 09/21/2019  . GERD (gastroesophageal reflux disease) 10/11/2018  . Smoker 10/11/2018  . Nausea and vomiting 10/11/2018  . Obesity, Class II, BMI 35-39.9 10/11/2018  . Back muscle spasm 11/10/2017  . Need for immunization against influenza 11/10/2017  . Depression 12/12/2016  . Elevated alkaline phosphatase level 12/12/2016  . 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 Barrett's esophagus - short segment 09/04/2016  . Weight gain 07/23/2016  . IBS (irritable bowel syndrome) 08/28/2014  . Insomnia 08/28/2014  . Cough 12/08/2012  . HLD (hyperlipidemia) 02/07/2009  . Human immunodeficiency virus  (HIV) disease ([place]) 12/05/2005  . HTN (hypertension) 12/05/2005    Past Surgical History:  Procedure Laterality Date  . ABDOMINAL HYSTERECTOMY  09/2003  . ANAL SPHINCTEROTOMY    . CHOLECYSTECTOMY  1990  . COLONOSCOPY  2009   NL  . ESOPHAGOGASTRODUODENOSCOPY  2018  . LEEP  2000   history of CIN   . TUBAL LIGATION      OB History   No obstetric history on file.      Home Medications    Prior to Admission medications   Medication Sig Start Date End Date Taking? Authorizing Provider  benzonatate (TESSALON) 200 MG capsule Take 1 capsule (200 mg total) by mouth 3 (three) times daily as needed for up to 7 days for cough. 05/20/20 05/27/20 Yes Advait Buice C, PA-C  fluticasone (FLONASE) 50 MCG/ACT nasal spray Place 1-2 sprays into both nostrils daily. 05/20/20  Yes Benedicto Capozzi C, PA-C  albuterol (VENTOLIN HFA) 108 (90 Base) MCG/ACT inhaler Inhale 1 puff into the lungs every 6 (six) hours as needed for wheezing or shortness of breath. 05/07/20   [place] Callas, NP  bictegravir-emtricitabine-tenofovir AF (BIKTARVY) 50-200-25 MG TABS tablet TAKE 1 TABLET BY MOUTH DAILY. TRY TO TAKE AT THE SAME TIME EACH DAY WITH OR WITHOUT FOOD. 11/30/19 11/29/20  [place] Callas, NP  lisinopril-hydrochlorothiazide (ZESTORETIC) 20-25 MG tablet TAKE 1 TABLET BY MOUTH DAILY. 05/08/20 05/08/21  [place] Callas, NP  omeprazole (PRILOSEC) 40 MG capsule TAKE 1 CAPSULE BY MOUTH ONCE A DAY 09/29/19 09/28/20  Gatha Mayer, MD  potassium chloride SA (KLOR-CON) 20 MEQ tablet TAKE 1 TABLET BY MOUTH ONCE DAILY 04/23/20 04/23/21  [place] Callas, NP  simvastatin (ZOCOR) 20 MG tablet TAKE 1 TABLET BY MOUTH ONCE A DAY 05/08/20 05/08/21  [place] Callas, NP  zolpidem (AMBIEN CR) 6.25 MG CR tablet TAKE 1 TABLET(12.5 MG) BY MOUTH AT BEDTIME AS NEEDED FOR SLEEP 04/19/20 10/16/20  [place] Callas, NP    Family History Family History  Problem Relation Age of Onset  . Diabetes Mother   . Hyperlipidemia Mother   .  Hypertension Mother   . Cancer Father        bladder cancer   . Heart disease Father   . Hypertension Father   . Diabetes Maternal Grandmother   . Hyperlipidemia Maternal Grandmother   . Colon cancer Neg Hx   . Esophageal cancer Neg Hx   . Rectal cancer Neg Hx   . Stomach cancer Neg Hx     Social History Social History   Tobacco Use  . Smoking status: Current Every Day Smoker    Packs/day: 0.25    Years: 30.00    Pack years: 7.50    Types: Cigarettes  . Smokeless tobacco: Never Used  . Tobacco comment:  pt. trying to quit  Vaping Use  . Vaping Use: Never used  Substance Use Topics  . Alcohol use: No    Alcohol/week: 0.0 standard drinks  . Drug use: No     Allergies   Codeine   Review of Systems Review of Systems  Constitutional: Negative for activity change, appetite change, chills, fatigue and fever.  HENT: Positive for ear pain and hearing loss. Negative for congestion, rhinorrhea, sinus pressure, sore throat and trouble swallowing.   Eyes: Negative for discharge and redness.  Respiratory: Positive for cough. Negative for chest tightness and shortness of breath.   Cardiovascular: Negative for chest pain.  Gastrointestinal: Negative for abdominal pain, diarrhea, nausea and vomiting.  Musculoskeletal: Negative for myalgias.  Skin: Negative for rash.  Neurological: Negative for dizziness, light-headedness and headaches.     Physical Exam Triage Vital Signs ED Triage Vitals  Enc Vitals Group     BP      Pulse      Resp      Temp      Temp src      SpO2      Weight      Height      Head Circumference      Peak Flow      Pain Score      Pain Loc      Pain Edu?      Excl. in [place]?    No data found.  Updated Vital Signs BP 131/83 (BP Location: Left Arm)   Pulse 79   Temp 98.4 F (36.9 C) (Oral)   Resp 16   SpO2 98%   Visual Acuity Right Eye Distance:   Left Eye Distance:   Bilateral Distance:    Right Eye Near:   Left Eye Near:     Bilateral Near:     Physical Exam Vitals and nursing note reviewed.  Constitutional:      Appearance: She is well-developed.     Comments: No acute distress  HENT:     Head: Normocephalic and atraumatic.     Ears:     Comments: Cerumen impaction on right, after removal TM intact with good bony landmarks and cone of light, no  erythema    Nose: Nose normal.     Mouth/Throat:     Comments: Oral mucosa pink and moist, no tonsillar enlargement or exudate. Posterior pharynx patent and nonerythematous, no uvula deviation or swelling. Normal phonation. Eyes:     Conjunctiva/sclera: Conjunctivae normal.  Cardiovascular:     Rate and Rhythm: Normal rate.  Pulmonary:     Effort: Pulmonary effort is normal. No respiratory distress.     Comments: Breathing comfortably at rest, CTABL, no wheezing, rales or other adventitious sounds auscultated Abdominal:     General: There is no distension.  Musculoskeletal:        General: Normal range of motion.     Cervical back: Neck supple.  Skin:    General: Skin is warm and dry.  Neurological:     Mental Status: She is alert and oriented to person, place, and time.      UC Treatments / Results  Labs (all labs ordered are listed, but only abnormal results are displayed) Labs Reviewed - No data to display  EKG   Radiology No results found.  Procedures Procedures (including critical care time)  Medications Ordered in UC Medications - No data to display  Initial Impression / Assessment and Plan / UC Course  I have reviewed the triage vital signs and the nursing notes.  Pertinent labs & imaging results that were available during my care of the patient were reviewed by me and considered in my medical decision making (see chart for details).     1.  Right cerumen impaction-irrigation by nursing staff with complete removal and improvement in hearing, advised if still having any pressure/muffled sensation to use Flonase to help with any  underlying eustachian tube dysfunction, no obvious effusion on TM today  2.  Cough-suspect likely postviral, lungs clear to auscultation, vital signs stable, Tessalon as needed, continue to monitor  Discussed strict return precautions. Patient verbalized understanding and is agreeable with plan.  Final Clinical Impressions(s) / UC Diagnoses   Final diagnoses:  Impacted cerumen of right ear     Discharge Instructions     Earwax removed May use Flonase nasal spray 1 to 2 spray each nostril for further relief of any underlying ear pressure/muffled sensation Tessalon every 8 hours for cough Follow-up if not improving or worsening    ED Prescriptions    Medication Sig Dispense Auth. Provider   fluticasone (FLONASE) 50 MCG/ACT nasal spray Place 1-2 sprays into both nostrils daily. 16 g K[place]en Dupee C, PA-C   benzonatate (TESSALON) 200 MG capsule Take 1 capsule (200 mg total) by mouth 3 (three) times daily as needed for up to 7 days for cough. 28 capsule Cahlil Sattar, [place] C, PA-C     PDMP not reviewed this encounter.   Janith Lima, [place] 05/21/20 [phone]

## 2020-05-20 NOTE — Discharge Instructions (Addendum)
Earwax removed May use Flonase nasal spray 1 to 2 spray each nostril for further relief of any underlying ear pressure/muffled sensation Tessalon every 8 hours for cough Follow-up if not improving or worsening

## 2020-05-21 ENCOUNTER — Encounter: Payer: Self-pay | Admitting: Advanced Practice Midwife

## 2020-05-21 ENCOUNTER — Other Ambulatory Visit: Payer: Self-pay | Admitting: Advanced Practice Midwife

## 2020-05-21 ENCOUNTER — Other Ambulatory Visit (HOSPITAL_COMMUNITY)
Admission: RE | Admit: 2020-05-21 | Discharge: 2020-05-21 | Disposition: A | Discharge status: Home / Self Care | Payer: BC Managed Care – PPO | Source: Ambulatory Visit | Attending: Advanced Practice Midwife | Admitting: Advanced Practice Midwife

## 2020-05-21 ENCOUNTER — Ambulatory Visit (INDEPENDENT_AMBULATORY_CARE_PROVIDER_SITE_OTHER): Payer: BC Managed Care – PPO | Admitting: Advanced Practice Midwife

## 2020-05-21 VITALS — BP 118/82 | HR 88 | Wt 203.5 lb

## 2020-05-21 DIAGNOSIS — Z720 Tobacco use: Secondary | ICD-10-CM | POA: Diagnosis not present

## 2020-05-21 DIAGNOSIS — Z124 Encounter for screening for malignant neoplasm of cervix: Secondary | ICD-10-CM | POA: Diagnosis not present

## 2020-05-21 DIAGNOSIS — N898 Other specified noninflammatory disorders of vagina: Secondary | ICD-10-CM | POA: Diagnosis not present

## 2020-05-21 DIAGNOSIS — Z01419 Encounter for gynecological examination (general) (routine) without abnormal findings: Secondary | ICD-10-CM | POA: Diagnosis not present

## 2020-05-21 DIAGNOSIS — Z1231 Encounter for screening mammogram for malignant neoplasm of breast: Secondary | ICD-10-CM

## 2020-05-21 LAB — POCT URINALYSIS DIP (DEVICE)
Glucose, UA: NEGATIVE mg/dL
Hgb urine dipstick: NEGATIVE
Ketones, ur: NEGATIVE mg/dL
Leukocytes,Ua: NEGATIVE
Nitrite: NEGATIVE
Protein, ur: NEGATIVE mg/dL
Specific Gravity, Urine: >=1.03 (ref 1.005–1.030)
Urobilinogen, UA: 0.2 mg/dL (ref 0.0–1.0)
pH: 5.5 (ref 5.0–8.0)

## 2020-05-21 NOTE — Patient Instructions (Signed)
Preventive Care 84-53 Years Old, Female Preventive care refers to lifestyle choices and visits with your health care provider that can promote health and wellness. This includes:  A yearly physical exam. This is also called an annual wellness visit.  Regular dental and eye exams.  Immunizations.  Screening for certain conditions.  Healthy lifestyle choices, such as: ? Eating a healthy diet. ? Getting regular exercise. ? Not using drugs or products that contain nicotine and tobacco. ? Limiting alcohol use. What can I expect for my preventive care visit? Physical exam Your health care provider will check your:  Height and weight. These may be used to calculate your BMI (body mass index). BMI is a measurement that tells if you are at a healthy weight.  Heart rate and blood pressure.  Body temperature.  Skin for abnormal spots. Counseling Your health care provider may ask you questions about your:  Past medical problems.  Family's medical history.  Alcohol, tobacco, and drug use.  Emotional well-being.  Home life and relationship well-being.  Sexual activity.  Diet, exercise, and sleep habits.  Work and work Statistician.  Access to firearms.  Method of birth control.  Menstrual cycle.  Pregnancy history. What immunizations do I need? Vaccines are usually given at various ages, according to a schedule. Your health care provider will recommend vaccines for you based on your age, medical history, and lifestyle or other factors, such as travel or where you work.   What tests do I need? Blood tests  Lipid and cholesterol levels. These may be checked every 5 years, or more often if you are over 3 years old.  Hepatitis C test.  Hepatitis B test. Screening  Lung cancer screening. You may have this screening every year starting at age 73 if you have a 30-pack-year history of smoking and currently smoke or have quit within the past 15 years.  Colorectal cancer  screening. ? All adults should have this screening starting at age 52 and continuing until age 17. ? Your health care provider may recommend screening at age 49 if you are at increased risk. ? You will have tests every 1-10 years, depending on your results and the type of screening test.  Diabetes screening. ? This is done by checking your blood sugar (glucose) after you have not eaten for a while (fasting). ? You may have this done every 1-3 years.  Mammogram. ? This may be done every 1-2 years. ? Talk with your health care provider about when you should start having regular mammograms. This may depend on whether you have a family history of breast cancer.  BRCA-related cancer screening. This may be done if you have a family history of breast, ovarian, tubal, or peritoneal cancers.  Pelvic exam and Pap test. ? This may be done every 3 years starting at age 10. ? Starting at age 11, this may be done every 5 years if you have a Pap test in combination with an HPV test. Other tests  STD (sexually transmitted disease) testing, if you are at risk.  Bone density scan. This is done to screen for osteoporosis. You may have this scan if you are at high risk for osteoporosis. Talk with your health care provider about your test results, treatment options, and if necessary, the need for more tests. Follow these instructions at home: Eating and drinking  Eat a diet that includes fresh fruits and vegetables, whole grains, lean protein, and low-fat dairy products.  Take vitamin and mineral supplements  as recommended by your health care provider.  Do not drink alcohol if: ? Your health care provider tells you not to drink. ? You are pregnant, may be pregnant, or are planning to become pregnant.  If you drink alcohol: ? Limit how much you have to 0-1 drink a day. ? Be aware of how much alcohol is in your drink. In the [place] one drink equals one 12 oz bottle of beer (355 mL), one 5 oz glass of  wine (148 mL), or one 1 oz glass of hard liquor (44 mL).   Lifestyle  Take daily care of your teeth and gums. Brush your teeth every morning and night with fluoride toothpaste. Floss one time each day.  Stay active. Exercise for at least 30 minutes 5 or more days each week.  Do not use any products that contain nicotine or tobacco, such as cigarettes, e-cigarettes, and chewing tobacco. If you need help quitting, ask your health care provider.  Do not use drugs.  If you are sexually active, practice safe sex. Use a condom or other form of protection to prevent STIs (sexually transmitted infections).  If you do not wish to become pregnant, use a form of birth control. If you plan to become pregnant, see your health care provider for a prepregnancy visit.  If told by your health care provider, take low-dose aspirin daily starting at age 50.  Find healthy ways to cope with stress, such as: ? Meditation, yoga, or listening to music. ? Journaling. ? Talking to a trusted person. ? Spending time with friends and family. Safety  Always wear your seat belt while driving or riding in a vehicle.  Do not drive: ? If you have been drinking alcohol. Do not ride with someone who has been drinking. ? When you are tired or distracted. ? While texting.  Wear a helmet and other protective equipment during sports activities.  If you have firearms in your house, make sure you follow all gun safety procedures. What's next?  Visit your health care provider once a year for an annual wellness visit.  Ask your health care provider how often you should have your eyes and teeth checked.  Stay up to date on all vaccines. This information is not intended to replace advice given to you by your health care provider. Make sure you discuss any questions you have with your health care provider. Document Revised: 10/17/2019 Document Reviewed: 09/23/2017 Elsevier Patient Education  2021 Elsevier Inc.  

## 2020-05-21 NOTE — Progress Notes (Signed)
GYNECOLOGY ANNUAL PREVENTATIVE CARE ENCOUNTER NOTE  History:     Amy Hernandez is a 55 y.o. [id number] female here for a routine annual gynecologic exam.  Current complaints: six month history of abnormal vaginal odor without accompanying abnormal vaginal discharge.  Patient reports history of Bacterial Vaginosis. Uses Summer's Eve for personal hygiene. Denies abnormal vaginal bleeding, pelvic pain, problems with intercourse or other gynecologic concerns.    Gynecologic History No LMP recorded. Patient has had a hysterectomy. Contraception: none Last Pap: 11/17/2017. Results were: normal with negative HPV Last mammogram: 09/29/2018. Results were: normal  Obstetric History OB History  Gravida Para Term Preterm AB Living  3 2 2   1 2   SAB IAB Ectopic Multiple Live Births    1     2    # Outcome Date GA Lbr Len/2nd Weight Sex Delivery Anes PTL Lv  3 Term 46    M Vag-Spont   LIV  2 Term 58    F Vag-Spont   LIV  1 IAB 39            Past Medical History:  Diagnosis Date  . Barrett's esophagus - short segment 09/04/2016  . Bartholin gland cyst   . Cervical intraepithelial neoplasia (CIN) 2005   high grade cervical dysplasia prior to total hysterectomy - needs yearly vaginal paps   . GERD (gastroesophageal reflux disease)   . 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 HIV (human immunodeficiency virus infection) ([place])   . Hyperlipidemia   . Hypertension   . IBS (irritable bowel syndrome)   . Insomnia   . Substance abuse [facility])     Past Surgical History:  Procedure Laterality Date  . ABDOMINAL HYSTERECTOMY  09/2003  . ANAL SPHINCTEROTOMY    . CHOLECYSTECTOMY  1990  . COLONOSCOPY  2009   NL  . ESOPHAGOGASTRODUODENOSCOPY  2018  . LEEP  2000   history of CIN   . TUBAL LIGATION      Current Outpatient Medications on File Prior to Visit  Medication Sig Dispense Refill  . albuterol (VENTOLIN HFA) 108 (90 Base) MCG/ACT inhaler Inhale 1 puff into the lungs  every 6 (six) hours as needed for wheezing or shortness of breath. 8.5 g 3  . benzonatate (TESSALON) 200 MG capsule Take 1 capsule (200 mg total) by mouth 3 (three) times daily as needed for up to 7 days for cough. 28 capsule 0  . bictegravir-emtricitabine-tenofovir AF (BIKTARVY) 50-200-25 MG TABS tablet TAKE 1 TABLET BY MOUTH DAILY. TRY TO TAKE AT THE SAME TIME EACH DAY WITH OR WITHOUT FOOD. 30 tablet 5  . lisinopril-hydrochlorothiazide (ZESTORETIC) 20-25 MG tablet TAKE 1 TABLET BY MOUTH DAILY. 30 tablet 3  . omeprazole (PRILOSEC) 40 MG capsule TAKE 1 CAPSULE BY MOUTH ONCE A DAY 90 capsule 2  . potassium chloride SA (KLOR-CON) 20 MEQ tablet TAKE 1 TABLET BY MOUTH ONCE DAILY 90 tablet 1  . simvastatin (ZOCOR) 20 MG tablet TAKE 1 TABLET BY MOUTH ONCE A DAY 30 tablet 3  . zolpidem (AMBIEN CR) 6.25 MG CR tablet TAKE 1 TABLET(12.5 MG) BY MOUTH AT BEDTIME AS NEEDED FOR SLEEP 30 tablet 2  . fluticasone (FLONASE) 50 MCG/ACT nasal spray Place 1-2 sprays into both nostrils daily. (Patient not taking: Reported on 05/21/2020) 16 g 0   No current facility-administered medications on file prior to visit.    Allergies  Allergen Reactions  . Codeine     REACTION: hallucinations  Social History:  reports that she has been smoking cigarettes. She has a 7.50 pack-year smoking history. She has never used smokeless tobacco. She reports that she does not drink alcohol and does not use drugs.  Family History  Problem Relation Age of Onset  . Diabetes Mother   . Hyperlipidemia Mother   . Hypertension Mother   . Cancer Father        bladder cancer   . Heart disease Father   . Hypertension Father   . Diabetes Maternal Grandmother   . Hyperlipidemia Maternal Grandmother   . Colon cancer Neg Hx   . Esophageal cancer Neg Hx   . Rectal cancer Neg Hx   . Stomach cancer Neg Hx     The following portions of the patient's history were reviewed and updated as appropriate: allergies, current medications, past  family history, past medical history, past social history, past surgical history and problem list.  Review of Systems Pertinent items noted in HPI and remainder of comprehensive ROS otherwise negative.  Physical Exam:  BP 118/82   Pulse 88   Wt 203 lb 8 oz (92.3 kg)   BMI 37.22 kg/m  CONSTITUTIONAL: Well-developed, well-nourished female in no acute distress.  HENT:  Normocephalic, atraumatic, External right and left ear normal.  EYES: Conjunctivae and EOM are normal. Pupils are equal, round, and reactive to light. No scleral icterus.  NECK: Normal range of motion, supple, no masses.  Normal thyroid.  SKIN: Skin is warm and dry. No rash noted. Not diaphoretic. No erythema. No pallor. MUSCULOSKELETAL: Normal range of motion. No tenderness.  No cyanosis, clubbing, or edema. NEUROLOGIC: Alert and oriented to person, place, and time. Normal reflexes, muscle tone coordination.  PSYCHIATRIC: Normal mood and affect. Normal behavior. Normal judgment and thought content. CARDIOVASCULAR: Normal heart rate noted, regular rhythm RESPIRATORY: Clear to auscultation bilaterally. Effort and breath sounds normal, no problems with respiration noted. BREASTS: Symmetric in size. No masses, tenderness, skin changes, nipple drainage, or lymphadenopathy bilaterally. Performed in the presence of a chaperone. ABDOMEN: Soft, no distention noted.  No tenderness, rebound or guarding.  PELVIC: Normal appearing external genitalia and urethral meatus; normal appearing vaginal mucosa and cervix.  No abnormal discharge noted.  Pap smear obtained.  Normal uterine size, no other palpable masses, no uterine or adnexal tenderness.  Performed in the presence of a chaperone.   Assessment and Plan:    1. Well woman exam with routine gynecological exam - No abnormal findings on exam  2. Cervical cancer screening - Pap not due, patient aware but requests since she is present for visit - Cytology - PAP( [place])  3.  Vaginal odor  - Cervicovaginal ancillary only( [place])  4. Tobacco use - Abstinence advised  Will follow up results of pap smear and manage accordingly. Mammogram scheduled Routine preventative health maintenance measures emphasized. Please refer to After Visit Summary for other counseling recommendations.   Mallie Snooks, MSN, CNM Certified Nurse Midwife, [facility] for [employer], [place] 05/21/20 11:37 AM

## 2020-05-21 NOTE — Progress Notes (Signed)
Patient here for pap smear and complains of vaginal odor that started about 6 months. States that her PH balance may be off due to products she has been using. She also mentioned that she was sexually active around that time but has not resume sexual activity since then. Denies fishy odor, says that the odor is not "her normal smell"   Mammogram has be scheduled for patient at [place] at 9:20am (Check in at 9:00am). She was given the information for future appointment. Appointment will also be sent to [place].  Zella Richer, [place] 05/21/20

## 2020-05-22 LAB — CERVICOVAGINAL ANCILLARY ONLY
Bacterial Vaginitis (gardnerella): NEGATIVE
Candida Glabrata: NEGATIVE
Candida Vaginitis: NEGATIVE
Comment: NEGATIVE
Comment: NEGATIVE
Comment: NEGATIVE

## 2020-05-23 LAB — CYTOLOGY - PAP
Adequacy: ABSENT
Comment: NEGATIVE
Diagnosis: NEGATIVE
High risk HPV: NEGATIVE

## 2020-05-27 ENCOUNTER — Other Ambulatory Visit: Payer: Self-pay | Admitting: Infectious Diseases

## 2020-05-27 DIAGNOSIS — R059 Cough, unspecified: Secondary | ICD-10-CM

## 2020-06-10 ENCOUNTER — Other Ambulatory Visit: Payer: Self-pay | Admitting: Infectious Diseases

## 2020-06-10 ENCOUNTER — Other Ambulatory Visit (HOSPITAL_COMMUNITY): Payer: Self-pay

## 2020-06-10 DIAGNOSIS — B20 Human immunodeficiency virus [HIV] disease: Secondary | ICD-10-CM

## 2020-06-10 MED ORDER — BIKTARVY 50-200-25 MG PO TABS
ORAL_TABLET | ORAL | 4 refills | Status: DC
  Filled 2020-06-10: qty 30, 30d supply, fill #0
  Filled 2020-07-12: qty 30, 30d supply, fill #1
  Filled 2020-08-09: qty 30, 30d supply, fill #2
  Filled 2020-09-05: qty 30, 30d supply, fill #3
  Filled 2020-10-10: qty 30, 30d supply, fill #4

## 2020-06-10 MED FILL — Potassium Chloride Microencapsulated Crys ER Tab 20 mEq: ORAL | 30 days supply | Qty: 30 | Fill #1 | Status: AC

## 2020-06-10 MED FILL — Omeprazole Cap Delayed Release 40 MG: ORAL | 30 days supply | Qty: 30 | Fill #1 | Status: AC

## 2020-06-10 MED FILL — Zolpidem Tartrate Tab ER 6.25 MG: ORAL | 30 days supply | Qty: 30 | Fill #1 | Status: AC

## 2020-06-12 ENCOUNTER — Other Ambulatory Visit: Payer: Self-pay

## 2020-06-12 ENCOUNTER — Other Ambulatory Visit (HOSPITAL_COMMUNITY): Payer: Self-pay

## 2020-06-12 ENCOUNTER — Other Ambulatory Visit: Payer: BC Managed Care – PPO

## 2020-06-12 DIAGNOSIS — R945 Abnormal results of liver function studies: Secondary | ICD-10-CM

## 2020-06-12 DIAGNOSIS — R7989 Other specified abnormal findings of blood chemistry: Secondary | ICD-10-CM | POA: Diagnosis not present

## 2020-06-13 LAB — COMPREHENSIVE METABOLIC PANEL
AG Ratio: 1.5 (calc) (ref 1.0–2.5)
ALT: 38 U/L — ABNORMAL HIGH (ref 6–29)
AST: 59 U/L — ABNORMAL HIGH (ref 10–35)
Albumin: 4.5 g/dL (ref 3.6–5.1)
Alkaline phosphatase (APISO): 90 U/L (ref 37–153)
BUN/Creatinine Ratio: 11 (calc) (ref 6–22)
BUN: 14 mg/dL (ref 7–25)
CO2: 27 mmol/L (ref 20–32)
Calcium: 9.9 mg/dL (ref 8.6–10.4)
Chloride: 100 mmol/L (ref 98–110)
Creat: 1.27 mg/dL — ABNORMAL HIGH (ref 0.50–1.05)
Globulin: 3.1 g/dL (calc) (ref 1.9–3.7)
Glucose, Bld: 117 mg/dL — ABNORMAL HIGH (ref 65–99)
Potassium: 3.6 mmol/L (ref 3.5–5.3)
Sodium: 139 mmol/L (ref 135–146)
Total Bilirubin: 0.5 mg/dL (ref 0.2–1.2)
Total Protein: 7.6 g/dL (ref 6.1–8.1)

## 2020-06-25 ENCOUNTER — Other Ambulatory Visit (HOSPITAL_COMMUNITY): Payer: Self-pay

## 2020-06-25 ENCOUNTER — Ambulatory Visit (INDEPENDENT_AMBULATORY_CARE_PROVIDER_SITE_OTHER): Payer: BC Managed Care – PPO | Admitting: Neurology

## 2020-06-25 ENCOUNTER — Encounter: Payer: Self-pay | Admitting: Neurology

## 2020-06-25 ENCOUNTER — Other Ambulatory Visit: Payer: Self-pay

## 2020-06-25 VITALS — BP 118/82 | HR 88 | Ht 62.0 in | Wt 200.0 lb

## 2020-06-25 DIAGNOSIS — F5104 Psychophysiologic insomnia: Secondary | ICD-10-CM

## 2020-06-25 DIAGNOSIS — R0683 Snoring: Secondary | ICD-10-CM | POA: Insufficient documentation

## 2020-06-25 DIAGNOSIS — H5461 Unqualified visual loss, right eye, normal vision left eye: Secondary | ICD-10-CM

## 2020-06-25 DIAGNOSIS — R5383 Other fatigue: Secondary | ICD-10-CM | POA: Diagnosis not present

## 2020-06-25 MED ORDER — BUPROPION HCL ER (XL) 150 MG PO TB24
150.0000 mg | ORAL_TABLET | Freq: Every day | ORAL | 5 refills | Status: DC
  Filled 2020-06-25: qty 30, 30d supply, fill #0
  Filled 2020-07-12: qty 30, 30d supply, fill #1
  Filled 2020-08-09: qty 30, 30d supply, fill #2
  Filled 2020-09-05: qty 30, 30d supply, fill #3
  Filled 2020-10-10: qty 30, 30d supply, fill #4
  Filled 2020-11-06: qty 30, 30d supply, fill #5

## 2020-06-25 MED ORDER — ZALEPLON 10 MG PO CAPS
10.0000 mg | ORAL_CAPSULE | Freq: Every evening | ORAL | 1 refills | Status: DC | PRN
  Filled 2020-06-25: qty 30, 30d supply, fill #0
  Filled 2020-07-12: qty 30, 30d supply, fill #1

## 2020-06-25 NOTE — Patient Instructions (Addendum)
Smoking cessation-  Steps to Quit Smoking Smoking tobacco is the leading cause of preventable death. It can affect almost every organ in the body. Smoking puts you and people around you at risk for many serious, long-lasting (chronic) diseases. Quitting smoking can be hard, but it is one of the best things that you can do for your health. It is never too late to quit. How do I get ready to quit? When you decide to quit smoking, make a plan to help you succeed. Before you quit:  Pick a date to quit. Set a date within the next 2 weeks to give you time to prepare.  Write down the reasons why you are quitting. Keep this list in places where you will see it often.  Tell your family, friends, and co-workers that you are quitting. Their support is important.  Talk with your doctor about the choices that may help you quit.  Find out if your health insurance will pay for these treatments.  Know the people, places, things, and activities that make you want to smoke (triggers). Avoid them. What first steps can I take to quit smoking?  Throw away all cigarettes at home, at work, and in your car.  Throw away the things that you use when you smoke, such as ashtrays and lighters.  Clean your car. Make sure to empty the ashtray.  Clean your home, including curtains and carpets. What can I do to help me quit smoking? Talk with your doctor about taking medicines and seeing a counselor at the same time. You are more likely to succeed when you do both.  If you are pregnant or breastfeeding, talk with your doctor about counseling or other ways to quit smoking. Do not take medicine to help you quit smoking unless your doctor tells you to do so. To quit smoking: Quit right away  Quit smoking totally, instead of slowly cutting back on how much you smoke over a period of time.  Go to counseling. You are more likely to quit if you go to counseling sessions regularly. Take medicine You may take medicines  to help you quit. Some medicines need a prescription, and some you can buy over-the-counter. Some medicines may contain a drug called nicotine to replace the nicotine in cigarettes. Medicines may:  Help you to stop having the desire to smoke (cravings).  Help to stop the problems that come when you stop smoking (withdrawal symptoms). Your doctor may ask you to use:  Nicotine patches, gum, or lozenges.  Nicotine inhalers or sprays.  Non-nicotine medicine that is taken by mouth. Find resources Find resources and other ways to help you quit smoking and remain smoke-free after you quit. These resources are most helpful when you use them often. They include:  Online chats with a Social worker.  Phone quitlines.  Printed Furniture conservator/restorer.  Support groups or group counseling.  Text messaging programs.  Mobile phone apps. Use apps on your mobile phone or tablet that can help you stick to your quit plan. There are many free apps for mobile phones and tablets as well as websites. Examples include Quit Guide from the [employer] and smokefree.gov   What things can I do to make it easier to quit?  Talk to your family and friends. Ask them to support and encourage you.  Call a phone quitline (1-800-QUIT-NOW), reach out to support groups, or work with a Social worker.  Ask people who smoke to not smoke around you.  Avoid places that make you  want to smoke, such as: ? Bars. ? Parties. ? Smoke-break areas at work.  Spend time with people who do not smoke.  Lower the stress in your life. Stress can make you want to smoke. Try these things to help your stress: ? Getting regular exercise. ? Doing deep-breathing exercises. ? Doing yoga. ? Meditating. ? Doing a body scan. To do this, close your eyes, focus on one area of your body at a time from head to toe. Notice which parts of your body are tense. Try to relax the muscles in those areas.   How will I feel when I quit smoking? Day 1 to 3  weeks Within the first 24 hours, you may start to have some problems that come from quitting tobacco. These problems are very bad 2-3 days after you quit, but they do not often last for more than 2-3 weeks. You may get these symptoms:  Mood swings.  Feeling restless, nervous, angry, or annoyed.  Trouble concentrating.  Dizziness.  Strong desire for high-sugar foods and nicotine.  Weight gain.  Trouble pooping (constipation).  Feeling like you may vomit (nausea).  Coughing or a sore throat.  Changes in how the medicines that you take for other issues work in your body.  Depression.  Trouble sleeping (insomnia). Week 3 and afterward After the first 2-3 weeks of quitting, you may start to notice more positive results, such as:  Better sense of smell and taste.  Less coughing and sore throat.  Slower heart rate.  Lower blood pressure.  Clearer skin.  Better breathing.  Fewer sick days. Quitting smoking can be hard. Do not give up if you fail the first time. Some people need to try a few times before they succeed. Do your best to stick to your quit plan, and talk with your doctor if you have any questions or concerns. Summary  Smoking tobacco is the leading cause of preventable death. Quitting smoking can be hard, but it is one of the best things that you can do for your health.  When you decide to quit smoking, make a plan to help you succeed.  Quit smoking right away, not slowly over a period of time.  When you start quitting, seek help from your doctor, family, or friends. This information is not intended to replace advice given to you by your health care provider. Make sure you discuss any questions you have with your health care provider. Document Revised: 10/07/2018 Document Reviewed: 04/02/2018 Elsevier Patient Education  2021 [employer].    Insomnia Insomnia is a sleep disorder that makes it difficult to fall asleep or stay asleep. Insomnia can cause  fatigue, low energy, difficulty concentrating, mood swings, and poor performance at work or school. There are three different ways to classify insomnia:  Difficulty falling asleep.  Difficulty staying asleep.  Waking up too early in the morning. Any type of insomnia can be long-term (chronic) or short-term (acute). Both are common. Short-term insomnia usually lasts for three months or less. Chronic insomnia occurs at least three times a week for longer than three months. What are the causes? Insomnia may be caused by another condition, situation, or substance, such as:  Anxiety.  Certain medicines.  Gastroesophageal reflux disease (GERD) or other gastrointestinal conditions.  Asthma or other breathing conditions.  Restless legs syndrome, sleep apnea, or other sleep disorders.  Chronic pain.  Menopause.  Stroke.  Abuse of alcohol, tobacco, or illegal drugs.  Mental health conditions, such as depression.  Caffeine.  Neurological disorders, such as Alzheimer's disease.  An overactive thyroid (hyperthyroidism). Sometimes, the cause of insomnia may not be known. What increases the risk? Risk factors for insomnia include:  Gender. Women are affected more often than men.  Age. Insomnia is more common as you get older.  Stress.  Lack of exercise.  Irregular work schedule or working night shifts.  Traveling between different time zones.  Certain medical and mental health conditions. What are the signs or symptoms? If you have insomnia, the main symptom is having trouble falling asleep or having trouble staying asleep. This may lead to other symptoms, such as:  Feeling fatigued or having low energy.  Feeling nervous about going to sleep.  Not feeling rested in the morning.  Having trouble concentrating.  Feeling irritable, anxious, or depressed. How is this diagnosed? This condition may be diagnosed based on:  Your symptoms and medical history. Your health  care provider may ask about: ? Your sleep habits. ? Any medical conditions you have. ? Your mental health.  A physical exam. How is this treated? Treatment for insomnia depends on the cause. Treatment may focus on treating an underlying condition that is causing insomnia. Treatment may also include:  Medicines to help you sleep.  Counseling or therapy.  Lifestyle adjustments to help you sleep better. Follow these instructions at home: Eating and drinking  Limit or avoid alcohol, caffeinated beverages, and cigarettes, especially close to bedtime. These can disrupt your sleep.  Do not eat a large meal or eat spicy foods right before bedtime. This can lead to digestive discomfort that can make it hard for you to sleep.   Sleep habits  Keep a sleep diary to help you and your health care provider figure out what could be causing your insomnia. Write down: ? When you sleep. ? When you wake up during the night. ? How well you sleep. ? How rested you feel the next day. ? Any side effects of medicines you are taking. ? What you eat and drink.  Make your bedroom a dark, comfortable place where it is easy to fall asleep. ? Put up shades or blackout curtains to block light from outside. ? Use a white noise machine to block noise. ? Keep the temperature cool.  Limit screen use before bedtime. This includes: ? Watching TV. ? Using your smartphone, tablet, or computer.  Stick to a routine that includes going to bed and waking up at the same times every day and night. This can help you fall asleep faster. Consider making a quiet activity, such as reading, part of your nighttime routine.  Try to avoid taking naps during the day so that you sleep better at night.  Get out of bed if you are still awake after 15 minutes of trying to sleep. Keep the lights down, but try reading or doing a quiet activity. When you feel sleepy, go back to bed.   General instructions  Take over-the-counter and  prescription medicines only as told by your health care provider.  Exercise regularly, as told by your health care provider. Avoid exercise starting several hours before bedtime.  Use relaxation techniques to manage stress. Ask your health care provider to suggest some techniques that may work well for you. These may include: ? Breathing exercises. ? Routines to release muscle tension. ? Visualizing peaceful scenes.  Make sure that you drive carefully. Avoid driving if you feel very sleepy.  Keep all follow-up visits as told by your health  care provider. This is important. Contact a health care provider if:  You are tired throughout the day.  You have trouble in your daily routine due to sleepiness.  You continue to have sleep problems, or your sleep problems get worse. Get help right away if:  You have serious thoughts about hurting yourself or someone else. If you ever feel like you may hurt yourself or others, or have thoughts about taking your own life, get help right away. You can go to your nearest emergency department or call:  Your local emergency services (911 in the [place]  A suicide crisis helpline, such as the [facility] at 959-484-6212. This is open 24 hours a day. Summary  Insomnia is a sleep disorder that makes it difficult to fall asleep or stay asleep.  Insomnia can be long-term (chronic) or short-term (acute).  Treatment for insomnia depends on the cause. Treatment may focus on treating an underlying condition that is causing insomnia.  Keep a sleep diary to help you and your health care provider figure out what could be causing your insomnia. This information is not intended to replace advice given to you by your health care provider. Make sure you discuss any questions you have with your health care provider. Document Revised: 11/23/2019 Document Reviewed: 11/23/2019 Elsevier Patient Education  2021 [employer].

## 2020-06-25 NOTE — Progress Notes (Addendum)
SLEEP MEDICINE CLINIC    Provider:  Larey Seat, MD  Primary Care Physician:  Patient, No Pcp Per (Inactive) No address on file     Referring Provider: [place] Callas, [place]  [address] [place],  [place] [id number]          Chief Complaint according to patient   Patient presents with:    . New Patient (Initial Visit)           HISTORY OF PRESENT ILLNESS:  Amy Hernandez is a 55 y.o. year old 19 or [place] American female patient , who is established with Amy Hernandez for general Neurology and presents today for a INSOMNIA evaluation -upon  referral  by Amy Madeira, NP , on 06/25/2020.   Chief concern according to patient :  " I was Ok on Ambien since my hysterectomy, and very active, until Amy. Carlean Hernandez felt I need a lower dose of ambiene, and then my insomnia came back- , from 12.5 to 6.5 mg. I have Barrett's esophagus,  I am soo fatigued now, but I just can't sleep"  " I failed Trazodone, Tylenol PM,  and melatonin - only then was re-prescribed Ambien" . I had acute vision loss with Migraine and have seen the ED and Amy. Tomi Likens, DO".    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   The patient never had a sleep study.    Sleep relevant medical history: No Nocturia, no Sleep walking, no ENT surgery/ Tonsillectomy. HIV treatment of retroviral.   Family medical /sleep history:  other family member on CPAP with OSA, insomnia, sleep walkers.    Social history:  Patient is working as Warden/ranger person. She  lives in a household with daughter and her grandson.  The patient  currently works daytime form home - Pets are present, a cat and a dog. Tobacco use; 10 cig or less a day-.  ETOH use ; none ,  Caffeine intake in form of Coffee( /) Soda( 2 pepsi a day) . Regular exercise in form of step training.    Sleep habits are as follows: The patient's dinner time is between 5-6 PM. The patient goes to bed at  9-10PM and continues to sleep for 7-8 hours while taking Ambien.  Now spending hours in bed before sleep may come.  RACING THOUGHTS. Tried meditation tapes. Never has seen psychologist.   The preferred sleep position is sideways or prone , with the support of 1-2 pillows.  Dreams are reportedly frequent/vivid.  6-7  AM is the usual rise time. The patient wakes up with an alarm, hitting the snooze button- . Averaging 4 hours of sleep on low dose ambien.  She reports not feeling refreshed or restored in AM, with symptoms such as dry mouth ,and residual fatigue.  Naps are taken infrequently,  She can't initiate sleep.    Review of Systems: Out of a complete 14 system review, the patient complains of only the following symptoms, and all other reviewed systems are negative.:  Fatigue,fragmented sleep,chronic  Insomnia.   How likely are you to doze in the following situations: 0 = not likely, 1 = slight chance, 2 = moderate chance, 3 =  high chance   Sitting and Reading? Watching Television? Sitting inactive in a public place (theater or meeting)? As a passenger in a car for an hour without a break? Lying down in the afternoon when circumstances permit? Sitting and talking to someone? Sitting quietly after lunch without alcohol? In a car, while stopped for a few minutes in traffic?   Total = ONE / 24 points   FSS endorsed at  58/ 63 points. VERY HIGH DEGREE OF FATIGUE>   Social History   Socioeconomic History  . Marital status: Legally Separated    Spouse name: Not on file  . Number of children: 2  . Years of education: Not on file  . Highest education  level: Not on file  Occupational History  . Occupation: Armed forces technical officer: [employer]  Tobacco Use  . Smoking status: Current Every Day Smoker    Packs/day: 0.25    Years: 30.00    Pack years: 7.50    Types: Cigarettes  . Smokeless tobacco: Never Used  . Tobacco comment:  pt. trying to quit  Vaping Use  . Vaping Use: Never used  Substance and Sexual Activity  . Alcohol use: No    Alcohol/week: 0.0 standard drinks  . Drug use: No  . Sexual activity: Not Currently    Partners: Male    Comment:  declined condoms  Other Topics Concern  . Not on file  Social History Narrative   Married but legally separated, 2 daughters grandchild at home   Customer service rep for [employer]   No  alcohol or drug use   She is a smoker   Social Determinants of Radio broadcast assistant Strain: Not on file  Food Insecurity: Not on file  Transportation Needs: Not on file  Physical Activity: Not on file  Stress: Not on file  Social Connections: Not on file    Family History  Problem Relation Age of Onset  . Diabetes Mother   . Hyperlipidemia Mother   . Hypertension Mother   . Cancer Father        bladder cancer   . Heart disease Father   . Hypertension Father   . Diabetes Maternal Grandmother   . Hyperlipidemia Maternal Grandmother   . Colon cancer Neg Hx   . Esophageal cancer Neg Hx   . Rectal cancer Neg Hx   . Stomach cancer Neg Hx     Past Medical History:  Diagnosis Date  . Barrett's esophagus - short segment 09/04/2016  . Bartholin gland cyst   . Cervical intraepithelial neoplasia (CIN) 2005   high grade cervical dysplasia prior to total hysterectomy - needs yearly vaginal paps   . GERD (gastroesophageal reflux disease)   . 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 HIV (human immunodeficiency virus infection) ([place])   . Hyperlipidemia   . Hypertension   . IBS (irritable bowel syndrome)   . Insomnia   . Substance abuse [facility])      Past Surgical History:  Procedure Laterality Date  . ABDOMINAL HYSTERECTOMY  09/2003  . ANAL SPHINCTEROTOMY    . CHOLECYSTECTOMY  1990  . COLONOSCOPY  2009   NL  . ESOPHAGOGASTRODUODENOSCOPY  2018  . LEEP  2000   history of CIN   . TUBAL LIGATION       Current Outpatient Medications on File Prior to Visit  Medication Sig Dispense Refill  . albuterol (VENTOLIN HFA) 108 (90 Base) MCG/ACT inhaler Inhale  1 puff into the lungs every 6 (six) hours as needed for wheezing or shortness of breath. 8.5 g 3  . bictegravir-emtricitabine-tenofovir AF (BIKTARVY) 50-200-25 MG TABS tablet TAKE 1 TABLET BY MOUTH DAILY. TRY TO TAKE AT THE SAME TIME EACH DAY WITH OR WITHOUT FOOD. 30 tablet 4  . fluticasone (FLONASE) 50 MCG/ACT nasal spray Place 1-2 sprays into both nostrils daily. 16 g 0  . lisinopril-hydrochlorothiazide (ZESTORETIC) 20-25 MG tablet TAKE 1 TABLET BY MOUTH DAILY. 30 tablet 3  . omeprazole (PRILOSEC) 40 MG capsule TAKE 1 CAPSULE BY MOUTH ONCE A DAY 90 capsule 2  . potassium chloride SA (KLOR-CON) 20 MEQ tablet TAKE 1 TABLET BY MOUTH ONCE DAILY 90 tablet 1  . simvastatin (ZOCOR) 20 MG tablet TAKE 1 TABLET BY MOUTH ONCE A DAY 30 tablet 3  . zolpidem (AMBIEN CR) 6.25 MG CR tablet TAKE 1 TABLET(12.5 MG) BY MOUTH AT BEDTIME AS NEEDED FOR SLEEP 30 tablet 2   No current facility-administered medications on file prior to visit.    Allergies  Allergen Reactions  . Codeine     REACTION: hallucinations    Physical exam:  Today's Vitals   06/25/20 0853  BP: 118/82  Pulse: 88  Weight: 200 lb (90.7 kg)  Height: _0  (1.575 m)   Body mass index is 36.58 kg/m.   Wt Readings from Last 3 Encounters:  06/25/20 200 lb (90.7 kg)  05/21/20 203 lb 8 oz (92.3 kg)  05/07/20 204 lb (92.5 kg)     Ht Readings from Last 3 Encounters:  06/25/20 _1  (1.575 m)  02/08/20 _2  (1.575 m)  11/02/19 _3  (1.575 m)      General: The patient is awake, alert and appears not in acute distress.  The patient is well groomed. Head: Normocephalic, atraumatic. Neck is supple. Mallampati 1,  neck circumference:15.5  inches . Nasal airflow patent.  Retrognathia is  seen.  Dental status: biological teeth Cardiovascular:  Regular rate and cardiac rhythm by pulse,  without distended neck veins. Respiratory: Lungs are clear to auscultation.  Skin:  Without evidence of ankle edema, or rash. Trunk: The patient's posture is erect.   Neurologic exam : The patient is awake and alert, oriented to place and time.   Memory subjective described as intact.  Attention span & concentration ability appears normal.  Speech is fluent,  without  dysarthria, dysphonia or aphasia.  Mood and affect are appropriate.   Cranial nerves: no loss of smell or taste reported  Pupils are equal and briskly reactive to light. Funduscopic exam deferred. .  Extraocular movements in vertical and horizontal planes were intact and without nystagmus. No Diplopia. Visual fields by finger perimetry are intact. Hearing was impaired on the left ear.   Facial sensation intact to fine touch.  Facial motor strength is symmetric and tongue and uvula move midline.  Neck ROM : rotation, tilt and flexion extension were normal for age and shoulder shrug was symmetrical.    Motor exam:  Symmetric bulk, tone and ROM.   Normal tone without cog- wheeling, symmetric grip strength .   Sensory:  Fine touch and vibration werenormal.  Proprioception tested in the upper extremities was normal.   Coordination: Rapid alternating movements in the fingers/hands were of normal speed. No changes in penmanship.   Gait and station: Patient could rise unassisted from a seated position, walked without assistive device.  Stance is of normal width/ base and the patient turned with 3 steps.  Toe  and heel walk were deferred.  Deep tendon reflexes: in the  upper and lower extremities are symmetric, brisk .  Babinski response was deferred.      After  spending a total time of  40  minutes face to face and additional time for physical and neurologic examination, review of laboratory studies,  personal review of imaging studies, reports and results of other testing and review of referral information / records as far as provided in visit, I have established the following assessments:  Patient with insomnia, dependent on Ambien now, reduction in dose has been met with re-onset of INSOMNIA, CHRONIC.    Hysterectomy 2006 is the patient's associated point of insomnia onset. Still having some hot flashes. Still sweating at night. Recovering drug addict.  Has already set good routines for meal times, bedtimes and a dark, cool, and quiet bedroom.   1) Racing thoughts keeping her form falling asleep, anxious and stressed. She is feeling as if the likelihood of sleep onset decreased with every look at the clock.   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    My Plan is to proceed with:  1) referral by PCP to cognitive behavior therapy, [place] for chronic insomnia. Anxiety as a possible cause.Marland Kitchen    2) I will once refill a short acting form of sleep aid, as she seems to have no trouble to sleep through, but trouble to initiating sleep.  Sonata 10 mg at 9.30 PM. Wellbutrin in AM 150 mg 24 h.  And HST  I would like to thank Amy Amy Hernandez and [place] Callas, [place],  [place] [address] for allowing me to meet with and to take care of this pleasant patient.   In short, Orelia Brandstetter is presenting with chronic insomnia - over 15 years or more. She is also snoring and may have OSA- a HST has been ordered.   I plan to  follow up  through our NP within 2-3  month.  Please refer to CBT - cognitive behavior therapy.    Electronically signed by: Larey Seat, MD 06/25/2020 8:57 AM  [facility] Board certified by The [employer] of Sleep Medicine and Diplomate of the [employer] of Sleep Medicine. Board certified In Neurology through the [place], Fellow of the [employer] of Neurology. Medical Director of [facility].

## 2020-07-04 ENCOUNTER — Other Ambulatory Visit (HOSPITAL_COMMUNITY): Payer: Self-pay

## 2020-07-10 ENCOUNTER — Other Ambulatory Visit: Payer: Self-pay

## 2020-07-10 ENCOUNTER — Ambulatory Visit
Admission: RE | Admit: 2020-07-10 | Discharge: 2020-07-10 | Disposition: A | Discharge status: Home / Self Care | Payer: BC Managed Care – PPO | Source: Ambulatory Visit | Attending: Advanced Practice Midwife | Admitting: Advanced Practice Midwife

## 2020-07-10 DIAGNOSIS — Z1231 Encounter for screening mammogram for malignant neoplasm of breast: Secondary | ICD-10-CM | POA: Diagnosis not present

## 2020-07-10 HISTORY — DX: Malignant neoplasm of unspecified site of unspecified female breast: C50.919

## 2020-07-12 ENCOUNTER — Other Ambulatory Visit (HOSPITAL_COMMUNITY): Payer: Self-pay

## 2020-07-12 MED FILL — Omeprazole Cap Delayed Release 40 MG: ORAL | 30 days supply | Qty: 30 | Fill #2 | Status: AC

## 2020-07-12 MED FILL — Potassium Chloride Microencapsulated Crys ER Tab 20 mEq: ORAL | 30 days supply | Qty: 30 | Fill #2 | Status: AC

## 2020-07-15 ENCOUNTER — Other Ambulatory Visit: Payer: Self-pay | Admitting: Advanced Practice Midwife

## 2020-07-15 DIAGNOSIS — R928 Other abnormal and inconclusive findings on diagnostic imaging of breast: Secondary | ICD-10-CM

## 2020-07-16 ENCOUNTER — Other Ambulatory Visit (HOSPITAL_COMMUNITY): Payer: Self-pay

## 2020-07-18 ENCOUNTER — Other Ambulatory Visit (HOSPITAL_COMMUNITY): Payer: Self-pay

## 2020-07-19 ENCOUNTER — Other Ambulatory Visit (HOSPITAL_COMMUNITY): Payer: Self-pay

## 2020-08-01 ENCOUNTER — Telehealth: Payer: Self-pay | Admitting: Neurology

## 2020-08-01 NOTE — Telephone Encounter (Signed)
Left a voice mail for pt to call the office to schedule her HST.

## 2020-08-06 ENCOUNTER — Ambulatory Visit
Admission: RE | Admit: 2020-08-06 | Discharge: 2020-08-06 | Disposition: A | Discharge status: Home / Self Care | Payer: BC Managed Care – PPO | Source: Ambulatory Visit | Attending: Advanced Practice Midwife | Admitting: Advanced Practice Midwife

## 2020-08-06 ENCOUNTER — Ambulatory Visit: Payer: BC Managed Care – PPO

## 2020-08-06 ENCOUNTER — Other Ambulatory Visit: Payer: Self-pay | Admitting: Advanced Practice Midwife

## 2020-08-06 ENCOUNTER — Other Ambulatory Visit: Payer: Self-pay

## 2020-08-06 DIAGNOSIS — R928 Other abnormal and inconclusive findings on diagnostic imaging of breast: Secondary | ICD-10-CM

## 2020-08-06 DIAGNOSIS — R922 Inconclusive mammogram: Secondary | ICD-10-CM | POA: Diagnosis not present

## 2020-08-09 ENCOUNTER — Other Ambulatory Visit: Payer: Self-pay

## 2020-08-09 ENCOUNTER — Other Ambulatory Visit (HOSPITAL_COMMUNITY): Payer: Self-pay

## 2020-08-09 ENCOUNTER — Ambulatory Visit
Admission: RE | Admit: 2020-08-09 | Discharge: 2020-08-09 | Disposition: A | Discharge status: Home / Self Care | Payer: BC Managed Care – PPO | Source: Ambulatory Visit | Attending: Advanced Practice Midwife | Admitting: Advanced Practice Midwife

## 2020-08-09 ENCOUNTER — Other Ambulatory Visit: Payer: Self-pay | Admitting: Neurology

## 2020-08-09 ENCOUNTER — Other Ambulatory Visit: Payer: Self-pay | Admitting: Advanced Practice Midwife

## 2020-08-09 DIAGNOSIS — N641 Fat necrosis of breast: Secondary | ICD-10-CM | POA: Diagnosis not present

## 2020-08-09 DIAGNOSIS — N6310 Unspecified lump in the right breast, unspecified quadrant: Secondary | ICD-10-CM

## 2020-08-09 DIAGNOSIS — N6314 Unspecified lump in the right breast, lower inner quadrant: Secondary | ICD-10-CM | POA: Diagnosis not present

## 2020-08-09 DIAGNOSIS — R928 Other abnormal and inconclusive findings on diagnostic imaging of breast: Secondary | ICD-10-CM

## 2020-08-09 MED FILL — Omeprazole Cap Delayed Release 40 MG: ORAL | 30 days supply | Qty: 30 | Fill #3 | Status: AC

## 2020-08-09 MED FILL — Potassium Chloride Microencapsulated Crys ER Tab 20 mEq: ORAL | 30 days supply | Qty: 30 | Fill #3 | Status: AC

## 2020-08-12 MED ORDER — ZALEPLON 10 MG PO CAPS: 10.0000 mg | ORAL_CAPSULE | Freq: Every evening | ORAL | 1 refills | Status: DC | PRN

## 2020-08-13 ENCOUNTER — Other Ambulatory Visit (HOSPITAL_COMMUNITY): Payer: Self-pay

## 2020-08-13 MED ORDER — ZALEPLON 10 MG PO CAPS
10.0000 mg | ORAL_CAPSULE | Freq: Every evening | ORAL | 1 refills | Status: DC | PRN
  Filled 2020-08-23: qty 30, 30d supply, fill #0
  Filled 2020-09-23: qty 30, 30d supply, fill #1

## 2020-08-14 ENCOUNTER — Ambulatory Visit
Admission: RE | Admit: 2020-08-14 | Discharge: 2020-08-14 | Disposition: A | Discharge status: Home / Self Care | Payer: BC Managed Care – PPO | Source: Ambulatory Visit | Attending: Advanced Practice Midwife | Admitting: Advanced Practice Midwife

## 2020-08-14 ENCOUNTER — Other Ambulatory Visit (HOSPITAL_COMMUNITY): Payer: Self-pay

## 2020-08-14 ENCOUNTER — Other Ambulatory Visit: Payer: Self-pay

## 2020-08-14 DIAGNOSIS — N6310 Unspecified lump in the right breast, unspecified quadrant: Secondary | ICD-10-CM

## 2020-08-14 DIAGNOSIS — R928 Other abnormal and inconclusive findings on diagnostic imaging of breast: Secondary | ICD-10-CM | POA: Diagnosis not present

## 2020-08-14 DIAGNOSIS — N641 Fat necrosis of breast: Secondary | ICD-10-CM | POA: Diagnosis not present

## 2020-08-19 ENCOUNTER — Ambulatory Visit (INDEPENDENT_AMBULATORY_CARE_PROVIDER_SITE_OTHER): Payer: BC Managed Care – PPO | Admitting: Neurology

## 2020-08-19 DIAGNOSIS — G4733 Obstructive sleep apnea (adult) (pediatric): Secondary | ICD-10-CM

## 2020-08-19 DIAGNOSIS — R5383 Other fatigue: Secondary | ICD-10-CM

## 2020-08-19 DIAGNOSIS — R0683 Snoring: Secondary | ICD-10-CM

## 2020-08-19 DIAGNOSIS — F5104 Psychophysiologic insomnia: Secondary | ICD-10-CM

## 2020-08-21 NOTE — Progress Notes (Signed)
[facility] TEST REPORT ( by Watch PAT)   STUDY DATE:   DOB:   MRN:    ORDERING CLINICIAN: Dohmeier, MD  REFERRING CLINICIANColletta Maryland Dixon,NP  Primary Neurologist : Metta Clines, DO  CLINICAL INFORMATION/HISTORY: Amy Hernandez is a 55 year old African-American female patient who presents for insomnia evaluation upon referral by [name] ;nurse practitioner, on 06-25-2020.   The patient describes that she has been using Ambien for many years ever since her hysterectomy until her gastroenterologist felt it was needed to lower the dose switched her from 12.8 dose milligrams of Ambien to 6.5 mg.  Apparently there was a correlation seen to have Barrett's esophagus condition.  However, her insomnia came back.  She describes that she was very fatigued she could still not sleep but she also could not function.  She has failed trazodone ,Tylenol PM, and melatonin. She has a history of vision loss with migraines that are treated by Dr. Tomi Likens.  She is seen in the infectious disease clinic for her HIV infection.  She does have irritable bowel syndrome, history of substance abuse, GERD, smoker, she is considered morbidly obese.   She describes racing thoughts interfering with her ability to go to sleep, her mind is working and she has tried listening to meditation tapes but she has never been referred to insomnia therapy with cognitive behavioral therapist.  She even reports that she cannot nap and endorsed only one-point on the Epworth sleepiness score.  She describes never having had a sleep study and apparently  has not been on other prescription sleep aids aside from trazodone and Ambien.     Epworth sleepiness score: 1/24.  The patient endorsed the fatigue severity score at 58 out of 63 points which is a very high degree.   BMI: 36.9 kg/m   Neck Circumference: 16'   FINDINGS:   Sleep Summary:   Total Recording Time (hours, min): This home sleep test had a total  recording time of 7 hours and 46 minutes of which 6 hours 33 minutes were sleep.  Out of the total sleep time 29.2% very REM sleep .  Respiratory Indices:   Calculated pAHI (per hour): This patient's overall apnea-hypopnea index was 36.4/h, REM sleep AHI was 58.4/h non-REM sleep AHI 27.8/h there was no supine sleep recorded.  The patient slept prone and on her left side.                                                                      Oxygen Saturation Statistics:   Oxygen Saturation (%) mean oxygenation was 91% with a minimum or nadir of oxygen saturation at 83 and a maximum of 98%.          O2 Saturation (minutes) <89%: Amounted to 7.1 minutes the equivalent of 1.8% of total sleep time.         Pulse Rate Statistics:           Pulse Range: Between 55 and 108 bpm with mean pulse rate of 87 bpm.                IMPRESSION:  This HST confirms the presence of severe sleep apnea, this is a obstructive  sleep apnea form with strong REM sleep accentuation and frequent but brief hypoxic events.   RECOMMENDATION: Given the degree of apnea which is severe and the associated hypoxia I would strongly recommend using positive airway pressure therapy for treatment.  I will write for an auto titration CPAP device with a setting between 6 and 20 cm water pressure 3 cm EPR, heated humidification and mask of choice. Treatment of apnea may not positively influence insomnia.  I strongly recommend cognitive behavioral therapy for patient who has struggled with insomnia for many many years. I also recommend smoking cessation.    INTERPRETING PHYSICIAN:   Larey Seat, MD   Medical Director of [facility] at [facility].

## 2020-08-22 ENCOUNTER — Other Ambulatory Visit: Payer: Self-pay | Admitting: *Deleted

## 2020-08-22 ENCOUNTER — Telehealth: Payer: Self-pay | Admitting: Neurology

## 2020-08-22 NOTE — Telephone Encounter (Signed)
Pt called wanting to know if her HST results have come in. Please advise.

## 2020-08-22 NOTE — Telephone Encounter (Signed)
Called pt. She had study on 08/19/20. Advised results not back yet. Can take 1-2 weeks for results to come back. We will call once available.  She never got refill for Sonata.  Rx never went through on 08/12/20 it appears. Per drug registry, she last refilled 07/19/20 #30. Sent request to Dr. Brett Fairy to escribe refill

## 2020-08-23 ENCOUNTER — Other Ambulatory Visit (HOSPITAL_COMMUNITY): Payer: Self-pay

## 2020-08-23 MED ORDER — ZALEPLON 10 MG PO CAPS
10.0000 mg | ORAL_CAPSULE | Freq: Every evening | ORAL | 1 refills | Status: DC | PRN
  Filled 2020-08-23 – 2020-10-23 (×2): qty 30, 30d supply, fill #0
  Filled 2020-11-21: qty 30, 30d supply, fill #1

## 2020-08-23 NOTE — Progress Notes (Signed)
Sonata Refilled.

## 2020-08-26 ENCOUNTER — Other Ambulatory Visit (HOSPITAL_COMMUNITY): Payer: Self-pay

## 2020-08-29 NOTE — Progress Notes (Signed)
IMPRESSION:  This HST confirms the presence of severe sleep apnea, this is a obstructive sleep apnea form with strong REM sleep accentuation and frequent but brief hypoxic events.  RECOMMENDATION: Given the degree of apnea which is severe and the associated hypoxia I would strongly recommend using positive airway pressure therapy for treatment.  I will write for an auto titration CPAP device with a setting between 6 and 20 cm water pressure 3 cm EPR, heated humidification and mask of choice. Treatment of apnea may not positively influence insomnia.  I strongly recommend cognitive behavioral therapy for patient who has struggled with insomnia for many many years. I also recommend smoking cessation.   INTERPRETING PHYSICIAN:   Larey Seat, MD

## 2020-08-29 NOTE — Procedures (Signed)
[facility] TEST REPORT ( by Watch PAT)   STUDY DATE:   DOB:   MRN:    ORDERING CLINICIAN: Mahad Newstrom, MD  REFERRING CLINICIANColletta Maryland Dixon,NP  Primary Neurologist : Metta Clines, DO  CLINICAL INFORMATION/HISTORY: Amy Hernandez is a 55 year old African-American female patient who presents for insomnia evaluation upon referral by [name] ;nurse practitioner, on 06-25-2020.   The patient describes that she has been using Ambien for many years ever since her hysterectomy until her gastroenterologist felt it was needed to lower the dose switched her from 12.8 dose milligrams of Ambien to 6.5 mg.  Apparently there was a correlation seen to have Barrett's esophagus condition.  However, her insomnia came back.  She describes that she was very fatigued she could still not sleep but she also could not function.  She has failed trazodone ,Tylenol PM, and melatonin. She has a history of vision loss with migraines that are treated by Dr. Tomi Likens.  She is seen in the infectious disease clinic for her HIV infection.  She does have irritable bowel syndrome, history of substance abuse, GERD, smoker, she is considered morbidly obese.   She describes racing thoughts interfering with her ability to go to sleep, her mind is working and she has tried listening to meditation tapes but she has never been referred to insomnia therapy with cognitive behavioral therapist.  She even reports that she cannot nap and endorsed only one-point on the Epworth sleepiness score.  She describes never having had a sleep study and apparently  has not been on other prescription sleep aids aside from trazodone and Ambien.     Epworth sleepiness score: 1/24.   The patient endorsed the fatigue severity score at 58 out of 63 points which is a very high degree.   BMI: 36.9 kg/m Neck Circumference: 16'    Sleep Summary:  Total Recording Time (hours, min): This home sleep test had a total recording time of 7  hours and 46 minutes of which 6 hours 33 minutes were sleep.  Out of the total sleep time 29.2% very REM sleep .  Respiratory Indices: Calculated pAHI (per hour): This patient's overall apnea-hypopnea index was 36.4/h, REM sleep AHI was 58.4/h non-REM sleep AHI 27.8/h there was no supine sleep recorded.  The patient slept prone and on her left side.                                                                     Oxygen Saturation Statistics:  Oxygen Saturation (%) mean oxygenation was 91% with a minimum or nadir of oxygen saturation at 83 and a maximum of 98%.          O2 Saturation (minutes) <89%: Amounted to 7.1 minutes, the equivalent of 1.8% of total sleep time.       Pulse Rate Statistics:         Pulse Range: Between 55 and 108 bpm with mean pulse rate of 87 bpm.                IMPRESSION:  This HST confirms the presence of severe sleep apnea, this is a obstructive sleep apnea form with strong REM sleep accentuation and frequent but brief hypoxic events.  RECOMMENDATION: Given the degree of apnea which is severe and the associated hypoxia I would strongly recommend using positive airway pressure therapy for treatment.  I will write for an auto titration CPAP device with a setting between 6 and 20 cm water pressure 3 cm EPR, heated humidification and mask of choice. Treatment of apnea may not positively influence insomnia.  I strongly recommend cognitive behavioral therapy for patient who has struggled with insomnia for many many years. I also recommend smoking cessation.    INTERPRETING PHYSICIAN:   Larey Seat, MD   Medical Director of [facility] at [facility].

## 2020-08-29 NOTE — Addendum Note (Signed)
Addended by: Larey Seat on: 08/29/2020 05:43 PM   Modules accepted: Orders

## 2020-09-02 ENCOUNTER — Telehealth: Payer: Self-pay | Admitting: Neurology

## 2020-09-02 NOTE — Telephone Encounter (Signed)
-----   Message from Larey Seat, MD sent at 08/29/2020  5:43 PM EDT ----- IMPRESSION:  This HST confirms the presence of severe sleep apnea, this is a obstructive sleep apnea form with strong REM sleep accentuation and frequent but brief hypoxic events.  RECOMMENDATION: Given the degree of apnea which is severe and the associated hypoxia I would strongly recommend using positive airway pressure therapy for treatment.  I will write for an auto titration CPAP device with a setting between 6 and 20 cm water pressure 3 cm EPR, heated humidification and mask of choice. Treatment of apnea may not positively influence insomnia.  I strongly recommend cognitive behavioral therapy for patient who has struggled with insomnia for many many years. I also recommend smoking cessation.   INTERPRETING PHYSICIAN:   Larey Seat, MD

## 2020-09-02 NOTE — Telephone Encounter (Signed)
I called pt. I advised pt that Dr. Brett Fairy reviewed their sleep study results and found that pt has severe sleep apnea. Dr. Brett Fairy recommends that pt starts auto CPAP. I reviewed PAP compliance expectations with the pt. Pt is agreeable to starting a CPAP. I advised pt that an order will be sent to a DME, [facility], and [facility] will call the pt within about one week after they file with the pt's insurance. [facility] will show the pt how to use the machine, fit for masks, and troubleshoot the CPAP if needed. A follow up appt was made for insurance purposes with Ward Givens, NP on Nov 3,2022 at 10 am. Pt verbalized understanding to arrive 15 minutes early and bring their CPAP. A letter with all of this information in it will be mailed to the pt as a reminder. I verified with the pt that the address we have on file is correct. Pt verbalized understanding of results. Pt had no questions at this time but was encouraged to call back if questions arise. I have sent the order to [facility] and have received confirmation that they have received the order.

## 2020-09-05 ENCOUNTER — Other Ambulatory Visit (HOSPITAL_COMMUNITY): Payer: Self-pay

## 2020-09-05 ENCOUNTER — Other Ambulatory Visit: Payer: Self-pay | Admitting: Infectious Diseases

## 2020-09-05 DIAGNOSIS — I10 Essential (primary) hypertension: Secondary | ICD-10-CM

## 2020-09-05 DIAGNOSIS — E785 Hyperlipidemia, unspecified: Secondary | ICD-10-CM

## 2020-09-05 MED ORDER — SIMVASTATIN 20 MG PO TABS
ORAL_TABLET | Freq: Every day | ORAL | 3 refills | Status: DC
  Filled 2020-09-05: qty 30, 30d supply, fill #0
  Filled 2020-10-10: qty 30, 30d supply, fill #1
  Filled 2020-11-06: qty 30, 30d supply, fill #2
  Filled 2020-12-02: qty 30, 30d supply, fill #3

## 2020-09-05 MED ORDER — LISINOPRIL-HYDROCHLOROTHIAZIDE 20-25 MG PO TABS
1.0000 | ORAL_TABLET | Freq: Every day | ORAL | 3 refills | Status: DC
  Filled 2020-09-05: qty 30, 30d supply, fill #0
  Filled 2020-10-10: qty 30, 30d supply, fill #1
  Filled 2020-11-06: qty 30, 30d supply, fill #2

## 2020-09-05 MED FILL — Potassium Chloride Microencapsulated Crys ER Tab 20 mEq: ORAL | 30 days supply | Qty: 30 | Fill #4 | Status: AC

## 2020-09-05 MED FILL — Omeprazole Cap Delayed Release 40 MG: ORAL | 30 days supply | Qty: 30 | Fill #4 | Status: AC

## 2020-09-09 ENCOUNTER — Other Ambulatory Visit (HOSPITAL_COMMUNITY): Payer: Self-pay

## 2020-09-23 ENCOUNTER — Other Ambulatory Visit (HOSPITAL_COMMUNITY): Payer: Self-pay

## 2020-09-25 ENCOUNTER — Encounter: Payer: Self-pay | Admitting: Internal Medicine

## 2020-10-03 ENCOUNTER — Other Ambulatory Visit (HOSPITAL_COMMUNITY): Payer: Self-pay

## 2020-10-03 ENCOUNTER — Other Ambulatory Visit: Payer: Self-pay | Admitting: Internal Medicine

## 2020-10-03 ENCOUNTER — Other Ambulatory Visit: Payer: Self-pay | Admitting: Infectious Diseases

## 2020-10-03 DIAGNOSIS — K580 Irritable bowel syndrome with diarrhea: Secondary | ICD-10-CM

## 2020-10-03 MED ORDER — OMEPRAZOLE 40 MG PO CPDR
DELAYED_RELEASE_CAPSULE | Freq: Every day | ORAL | 2 refills | Status: DC
  Filled 2020-10-10: qty 30, 30d supply, fill #0
  Filled 2020-11-06: qty 30, 30d supply, fill #1
  Filled 2020-12-02: qty 30, 30d supply, fill #2
  Filled 2021-01-01: qty 30, 30d supply, fill #3
  Filled 2021-02-04: qty 30, 30d supply, fill #4
  Filled 2021-03-13: qty 30, 30d supply, fill #5
  Filled 2021-07-23: qty 30, 30d supply, fill #6
  Filled 2021-09-26: qty 30, 30d supply, fill #7

## 2020-10-04 ENCOUNTER — Other Ambulatory Visit (HOSPITAL_COMMUNITY): Payer: Self-pay

## 2020-10-04 NOTE — Telephone Encounter (Signed)
Please advise on refill and if we are going to continue the medicine. Landis Gandy, RN

## 2020-10-07 ENCOUNTER — Other Ambulatory Visit (HOSPITAL_COMMUNITY): Payer: Self-pay

## 2020-10-07 MED ORDER — POTASSIUM CHLORIDE CRYS ER 20 MEQ PO TBCR
EXTENDED_RELEASE_TABLET | Freq: Every day | ORAL | 1 refills | Status: DC
  Filled 2020-10-07: qty 30, 30d supply, fill #0
  Filled 2020-11-06: qty 30, 30d supply, fill #1

## 2020-10-10 ENCOUNTER — Other Ambulatory Visit (HOSPITAL_COMMUNITY): Payer: Self-pay

## 2020-10-23 ENCOUNTER — Other Ambulatory Visit (HOSPITAL_COMMUNITY): Payer: Self-pay

## 2020-11-06 ENCOUNTER — Other Ambulatory Visit: Payer: Self-pay | Admitting: Infectious Diseases

## 2020-11-06 ENCOUNTER — Other Ambulatory Visit (HOSPITAL_COMMUNITY): Payer: Self-pay

## 2020-11-06 DIAGNOSIS — B20 Human immunodeficiency virus [HIV] disease: Secondary | ICD-10-CM

## 2020-11-06 MED ORDER — BIKTARVY 50-200-25 MG PO TABS
ORAL_TABLET | ORAL | 0 refills | Status: DC
Start: 2020-11-06 — End: 2020-11-12
  Filled 2020-11-06: qty 30, 30d supply, fill #0

## 2020-11-12 ENCOUNTER — Ambulatory Visit (INDEPENDENT_AMBULATORY_CARE_PROVIDER_SITE_OTHER): Payer: BC Managed Care – PPO | Admitting: Infectious Diseases

## 2020-11-12 ENCOUNTER — Encounter: Payer: Self-pay | Admitting: Infectious Diseases

## 2020-11-12 ENCOUNTER — Ambulatory Visit (INDEPENDENT_AMBULATORY_CARE_PROVIDER_SITE_OTHER): Payer: BC Managed Care – PPO

## 2020-11-12 ENCOUNTER — Other Ambulatory Visit (HOSPITAL_COMMUNITY): Payer: Self-pay

## 2020-11-12 ENCOUNTER — Other Ambulatory Visit: Payer: Self-pay

## 2020-11-12 VITALS — BP 112/81 | HR 94 | Resp 16 | Ht 62.0 in | Wt 191.6 lb

## 2020-11-12 DIAGNOSIS — Z23 Encounter for immunization: Secondary | ICD-10-CM | POA: Diagnosis not present

## 2020-11-12 DIAGNOSIS — F5104 Psychophysiologic insomnia: Secondary | ICD-10-CM

## 2020-11-12 DIAGNOSIS — K580 Irritable bowel syndrome with diarrhea: Secondary | ICD-10-CM

## 2020-11-12 DIAGNOSIS — I10 Essential (primary) hypertension: Secondary | ICD-10-CM | POA: Diagnosis not present

## 2020-11-12 DIAGNOSIS — R0683 Snoring: Secondary | ICD-10-CM

## 2020-11-12 DIAGNOSIS — B20 Human immunodeficiency virus [HIV] disease: Secondary | ICD-10-CM | POA: Diagnosis not present

## 2020-11-12 DIAGNOSIS — E876 Hypokalemia: Secondary | ICD-10-CM

## 2020-11-12 MED ORDER — LISINOPRIL-HYDROCHLOROTHIAZIDE 20-25 MG PO TABS
1.0000 | ORAL_TABLET | Freq: Every day | ORAL | 3 refills | Status: DC
Start: 2020-11-12 — End: 2021-11-25
  Filled 2020-11-12: qty 90, 90d supply, fill #0
  Filled 2020-12-02: qty 30, 30d supply, fill #0
  Filled 2021-01-01: qty 30, 30d supply, fill #1
  Filled 2021-02-04: qty 30, 30d supply, fill #2
  Filled 2021-03-13: qty 30, 30d supply, fill #3
  Filled 2021-04-02: qty 30, 30d supply, fill #4
  Filled 2021-04-28: qty 30, 30d supply, fill #5
  Filled 2021-05-27: qty 30, 30d supply, fill #6
  Filled 2021-06-25: qty 30, 30d supply, fill #7
  Filled 2021-07-23: qty 30, 30d supply, fill #8
  Filled 2021-09-01: qty 30, 30d supply, fill #9
  Filled 2021-09-26: qty 30, 30d supply, fill #10
  Filled 2021-10-23: qty 30, 30d supply, fill #11

## 2020-11-12 MED ORDER — BIKTARVY 50-200-25 MG PO TABS
ORAL_TABLET | ORAL | 11 refills | Status: DC
Start: 2020-11-12 — End: 2021-11-11
  Filled 2020-11-12: qty 30, fill #0
  Filled 2020-12-02: qty 30, 30d supply, fill #0
  Filled 2021-01-01: qty 30, 30d supply, fill #1
  Filled 2021-02-04: qty 30, 30d supply, fill #2
  Filled 2021-03-04: qty 30, 30d supply, fill #3
  Filled 2021-04-02: qty 30, 30d supply, fill #4
  Filled 2021-04-28: qty 30, 30d supply, fill #5
  Filled 2021-05-27: qty 30, 30d supply, fill #6
  Filled 2021-06-25: qty 30, 30d supply, fill #7
  Filled 2021-07-23: qty 30, 30d supply, fill #8
  Filled 2021-09-01: qty 30, 30d supply, fill #9
  Filled 2021-09-26: qty 30, 30d supply, fill #10
  Filled 2021-10-23: qty 30, 30d supply, fill #11

## 2020-11-12 MED ORDER — POTASSIUM CHLORIDE CRYS ER 20 MEQ PO TBCR
EXTENDED_RELEASE_TABLET | Freq: Every day | ORAL | 3 refills | Status: DC
  Filled 2020-11-12: qty 90, fill #0
  Filled 2020-12-02: qty 30, 30d supply, fill #0
  Filled 2021-01-01: qty 30, 30d supply, fill #1
  Filled 2021-02-04: qty 30, 30d supply, fill #2
  Filled 2021-03-13: qty 30, 30d supply, fill #3
  Filled 2021-04-02: qty 30, 30d supply, fill #4
  Filled 2021-04-28: qty 30, 30d supply, fill #5
  Filled 2021-05-27: qty 30, 30d supply, fill #6
  Filled 2021-06-25: qty 30, 30d supply, fill #7
  Filled 2021-07-23: qty 30, 30d supply, fill #8
  Filled 2021-09-01: qty 30, 30d supply, fill #9
  Filled 2021-09-26: qty 30, 30d supply, fill #10
  Filled 2021-10-23: qty 30, 30d supply, fill #11

## 2020-11-12 NOTE — Assessment & Plan Note (Addendum)
BP Readings from Last 3 Encounters:  11/12/20 112/81  06/25/20 118/82  05/21/20 118/82   Well controlled on current regimen - refills provided. Need to watch, however if she does have continued weight loss as she may not need as much BP medication. Referral in process to PCP for assistance.

## 2020-11-12 NOTE — Progress Notes (Signed)
Patient Name: Amy Hernandez  Date of Birth: [birth year] MRN: [id number]  PCP: Patient, No Pcp Per (Inactive)     SUBJECTIVE:  Brief Narrative:  Amy Hernandez is a 55 y.o. female with well controlled HIV on Biktarvy. HIV Risk: heterosexual, OI Hx: none  Previous Regimens:  Atripla - well controlled but required switch d/t insurance  Odefsey - did not like s/e or food requirement  Biktarvy 06/2017 - suppressed   Genotype:  sensitive    CC:  Chief Complaint  Patient presents with   Follow-up     HPI: Starting to have escalating stomach issues - more diarrhea and weight loss - about 12 #. She started making intentional changes to lose "covid" weight. She has continued to lost weight 1lb weekly on average for the last few months. She has noticed early satiety as most prominent contributor and low appetite. She has diarrhea just about everyday. Guaranteed in the AM upon waking, after first meal. These symptoms do improve as the day progresses. She has some dull pain around her bellybutton described to be uncomfortable crampy. Current problems have been going on 2-3 months by now. Very concerned that she may trend back to significant weight loss similar to what happened in the past. Has had some weakness and shaking noted as well. Requesting a new GI provider.   Working with sleep center - she had a home sleep study that confirmed severe sleep apnea with hypoxia. CPAP auto-titration was recommended. She has a follow up with them in November. CBT was also recommended to help with insomnia. She has reservations for CPAP given expense.   Taking biktarvy everyday with no concerns. She has access to her medications and no trouble with this. Never forgets her medication. No sexual relationships currently.    Review of Systems  Constitutional:  Positive for weight loss. Negative for chills and fever.  HENT:  Negative for tinnitus.   Eyes:  Negative for blurred vision and photophobia.   Respiratory:  Negative for cough and sputum production.   Cardiovascular:  Negative for chest pain.  Gastrointestinal:  Positive for abdominal pain and diarrhea. Negative for nausea and vomiting.  Genitourinary:  Negative for dysuria.  Skin:  Negative for rash.  Neurological:  Positive for dizziness and tingling. Negative for headaches.  Psychiatric/Behavioral:  Negative for substance abuse. The patient has insomnia.    Past Medical History:  Diagnosis Date   Barrett's esophagus - short segment 09/04/2016   Bartholin gland cyst    Breast cancer ([place])    Cervical intraepithelial neoplasia (CIN) 2005   high grade cervical dysplasia prior to total hysterectomy - needs yearly vaginal paps    GERD (gastroesophageal reflux disease)    Granular cell tumor - distal esophagus - smal 09/04/2016   HIV (human immunodeficiency virus infection) ([place])    Hyperlipidemia    Hypertension    IBS (irritable bowel syndrome)    Insomnia    Substance abuse ([place])    Outpatient Medications Prior to Visit  Medication Sig Dispense Refill   albuterol (VENTOLIN HFA) 108 (90 Base) MCG/ACT inhaler Inhale 1 puff into the lungs every 6 (six) hours as needed for wheezing or shortness of breath. 8.5 g 3   buPROPion (WELLBUTRIN XL) 150 MG 24 hr tablet Take 1 tablet (150 mg total) by mouth daily. 30 tablet 5   fluticasone (FLONASE) 50 MCG/ACT nasal spray Place 1-2 sprays into both nostrils daily. 16 g 0   omeprazole (PRILOSEC) 40 MG capsule  TAKE 1 CAPSULE BY MOUTH ONCE A DAY 90 capsule 2   simvastatin (ZOCOR) 20 MG tablet TAKE 1 TABLET BY MOUTH ONCE A DAY 30 tablet 3   zaleplon (SONATA) 10 MG capsule Take 1 capsule (10 mg total) by mouth at bedtime as needed for sleep 30 capsule 1   zaleplon (SONATA) 10 MG capsule Take 1 capsule (10 mg total) by mouth at bedtime as needed for sleep. 30 capsule 1   bictegravir-emtricitabine-tenofovir AF (BIKTARVY) 50-200-25 MG TABS tablet TAKE 1 TABLET BY MOUTH DAILY. TRY TO TAKE AT  THE SAME TIME EACH DAY WITH OR WITHOUT FOOD. 30 tablet 0   lisinopril-hydrochlorothiazide (ZESTORETIC) 20-25 MG tablet TAKE 1 TABLET BY MOUTH DAILY. 30 tablet 3   potassium chloride SA (KLOR-CON) 20 MEQ tablet TAKE 1 TABLET BY MOUTH ONCE DAILY 90 tablet 1   No facility-administered medications prior to visit.    Allergies  Allergen Reactions   Codeine     REACTION: hallucinations    Physical Assessment & Diagnostic Findings: Vitals:   11/12/20 0949  BP: 112/81  Pulse: 94  Resp: 16  SpO2: 100%  Weight: 191 lb 9.6 oz (86.9 kg)  Height: 5\' 2"  (1.575 m)   Body mass index is 35.04 kg/m.   Physical Exam Vitals reviewed.  Constitutional:      General: She is not in acute distress.    Appearance: She is well-developed.     Comments: Seated comfortably in chair.   HENT:     Nose: Congestion present. No rhinorrhea.     Mouth/Throat:     Mouth: Mucous membranes are moist. No oral lesions.     Dentition: Normal dentition. No dental abscesses.     Pharynx: Oropharynx is clear. No oropharyngeal exudate.  Eyes:     Conjunctiva/sclera: Conjunctivae normal.     Comments: Small cyst noted on the lower right eyelid.  Cardiovascular:     Rate and Rhythm: Normal rate and regular rhythm.     Heart sounds: Normal heart sounds.  Pulmonary:     Effort: Pulmonary effort is normal.     Breath sounds: Normal breath sounds. No wheezing or rales.  Abdominal:     General: There is no distension.     Palpations: Abdomen is soft.     Tenderness: There is no abdominal tenderness.  Musculoskeletal:     Cervical back: Normal range of motion.  Lymphadenopathy:     Cervical: No cervical adenopathy.  Skin:    General: Skin is warm and dry.     Capillary Refill: Capillary refill takes less than 2 seconds.     Findings: No rash.  Neurological:     Mental Status: She is alert and oriented to person, place, and time.  Psychiatric:        Judgment: Judgment normal.     Comments: In good spirits  today and engaged in care discussion   Deferred as our visit was spent in direct counseling    Lab Results Lab Results  Component Value Date   CREATININE 1.27 (H) 06/12/2020   BUN 14 06/12/2020   NA 139 06/12/2020   K 3.6 06/12/2020   CL 100 06/12/2020   CO2 27 06/12/2020    Lab Results  Component Value Date   ALT 38 (H) 06/12/2020   AST 59 (H) 06/12/2020   ALKPHOS 78 09/21/2019   BILITOT 0.5 06/12/2020    Lab Results  Component Value Date   CHOL 173 04/03/2019   HDL 40 (L)  04/03/2019   LDLCALC 104 (H) 04/03/2019   TRIG 172 (H) 04/03/2019   CHOLHDL 4.3 04/03/2019   HIV 1 RNA Quant  Date Value  05/07/2020 Not Detected Copies/mL  04/03/2019 <20 NOT DETECTED copies/mL  06/28/2018 <20 NOT DETECTED copies/mL   CD4 T Cell Abs (/uL)  Date Value  05/07/2020 1,243  04/03/2019 1,302  06/28/2018 1,298   Problem List Items Addressed This Visit       Unprioritized   Human immunodeficiency virus (HIV) disease ([place]) - Primary (Chronic)    Well controlled on biktarvy once daily. Will continue this for her. Will update pertinent labs today.  Vaccines provided.  Vaginal pap smear normal cytology, (-) HPV - recommended repeat in 76yrs. She is probably eligible to defer if no invasive dysplasia history and being she is s/p hysterectomy.  RTC in 1 year  Recommendations for primary care discussed today - she will reach out to establish.       Relevant Medications   bictegravir-emtricitabine-tenofovir AF (BIKTARVY) 50-200-25 MG TABS tablet   Other Relevant Orders   HIV-1 RNA quant-no reflex-bld   T-helper cell (CD4)- ([facility] clinic only)   Need for immunization against influenza   Relevant Orders   Flu Vaccine QUAD 47mo+IM (Fluarix, Fluzone & Alfiuria Quad PF) (Completed)   IBS (irritable bowel syndrome)    Un-intentional weight loss with early morning diarrhea, early satiety and generalized crampy abdominal discomfort.  H/O barrett's esophagus. Last EGD 09-2019 --> she is  likely due for repeat study. She will call [facility] to schedule an appointment with a different provider.       Relevant Medications   potassium chloride SA (KLOR-CON) 20 MEQ tablet   Hypokalemia    Chronically needs supplementation with HCTZ - refilled today.      HTN (hypertension)    BP Readings from Last 3 Encounters:  11/12/20 112/81  06/25/20 118/82  05/21/20 118/82  Well controlled on current regimen - refills provided. Need to watch, however if she does have continued weight loss as she may not need as much BP medication. Referral in process to PCP for assistance.       Relevant Medications   lisinopril-hydrochlorothiazide (ZESTORETIC) 20-25 MG tablet   Habitual snoring    Severe sleep apnea noted - she is leery on purchasing CPAP because she feels like it will not be comfortable and she will not use it. I wonder if she would be eligible for mouth piece to help? I am certain this is contributing at least partially to difficulty staying asleep, but she worries it will impact her ability to fall asleep with the mask on.  Recommended she reach out to sleep team to discuss further options for her.       Chronic insomnia   Other Visit Diagnoses     Essential hypertension       Relevant Medications   lisinopril-hydrochlorothiazide (ZESTORETIC) 20-25 MG tablet      [name], MSN, NP-C [facility] [place] [facility] Pager: [phone]

## 2020-11-12 NOTE — Assessment & Plan Note (Signed)
Severe sleep apnea noted - she is leery on purchasing CPAP because she feels like it will not be comfortable and she will not use it. I wonder if she would be eligible for mouth piece to help? I am certain this is contributing at least partially to difficulty staying asleep, but she worries it will impact her ability to fall asleep with the mask on.  Recommended she reach out to sleep team to discuss further options for her.

## 2020-11-12 NOTE — Assessment & Plan Note (Signed)
Well controlled on biktarvy once daily. Will continue this for her. Will update pertinent labs today.  Vaccines provided.  Vaginal pap smear normal cytology, (-) HPV - recommended repeat in 1yrs. She is probably eligible to defer if no invasive dysplasia history and being she is s/p hysterectomy.  RTC in 1 year  Recommendations for primary care discussed today - she will reach out to establish.

## 2020-11-12 NOTE — Assessment & Plan Note (Addendum)
Un-intentional weight loss with early morning diarrhea, early satiety and generalized crampy abdominal discomfort.  H/O barrett's esophagus. Last EGD 09-2019 --> she is likely due for repeat study. She will call [facility] to schedule an appointment with a different provider.

## 2020-11-12 NOTE — Patient Instructions (Addendum)
So nice to see you!  Please continue your Biktarvy every day and stop by the lab on your way out.   For GI - Dr. Silverio Decamp, Dr. Tarri Glenn   Dr. Quay Burow or Dr. Sharlet Salina for your [place]: [phone]  For the Sleep Apnea - ask if you are a candidate for a mouth guard option.   Will plan to see you back in 1 year - can do labs same day for you.

## 2020-11-12 NOTE — Assessment & Plan Note (Signed)
Chronically needs supplementation with HCTZ - refilled today.

## 2020-11-13 LAB — T-HELPER CELL (CD4) - (RCID CLINIC ONLY)
CD4 % Helper T Cell: 29 % — ABNORMAL LOW (ref 33–65)
CD4 T Cell Abs: 1224 /uL (ref 400–1790)

## 2020-11-14 LAB — HIV-1 RNA QUANT-NO REFLEX-BLD
HIV 1 RNA Quant: NOT DETECTED Copies/mL
HIV-1 RNA Quant, Log: NOT DETECTED Log cps/mL

## 2020-11-21 ENCOUNTER — Other Ambulatory Visit (HOSPITAL_COMMUNITY): Payer: Self-pay

## 2020-11-27 ENCOUNTER — Telehealth: Payer: Self-pay | Admitting: Neurology

## 2020-11-27 NOTE — Telephone Encounter (Signed)
This is Investment banker, corporate cx, pt will not be getting the CPAP due to extra cost.  Pt has not requested a call back

## 2020-11-28 ENCOUNTER — Ambulatory Visit: Payer: BC Managed Care – PPO | Admitting: Adult Health

## 2020-12-02 ENCOUNTER — Other Ambulatory Visit (HOSPITAL_COMMUNITY): Payer: Self-pay

## 2020-12-02 ENCOUNTER — Other Ambulatory Visit: Payer: Self-pay | Admitting: Neurology

## 2020-12-03 ENCOUNTER — Telehealth: Payer: Self-pay | Admitting: Neurology

## 2020-12-03 ENCOUNTER — Other Ambulatory Visit (HOSPITAL_COMMUNITY): Payer: Self-pay

## 2020-12-03 MED ORDER — BUPROPION HCL ER (XL) 150 MG PO TB24
150.0000 mg | ORAL_TABLET | Freq: Every day | ORAL | 5 refills | Status: DC
  Filled 2020-12-03: qty 30, 30d supply, fill #0
  Filled 2021-01-01: qty 30, 30d supply, fill #1
  Filled 2021-02-04: qty 30, 30d supply, fill #2
  Filled 2021-03-13: qty 30, 30d supply, fill #3
  Filled 2021-04-02: qty 30, 30d supply, fill #4
  Filled 2021-04-28: qty 30, 30d supply, fill #5

## 2020-12-03 NOTE — Telephone Encounter (Signed)
SLEEP MEDICINE CLINIC PATIENT :  Sending  wellbutrin 150 mg with refills to [facility] outpatient pharmacy per patient,s request

## 2020-12-05 ENCOUNTER — Other Ambulatory Visit (HOSPITAL_COMMUNITY): Payer: Self-pay

## 2020-12-05 DIAGNOSIS — H029 Unspecified disorder of eyelid: Secondary | ICD-10-CM | POA: Diagnosis not present

## 2020-12-05 MED ORDER — NEOMYCIN-POLYMYXIN-DEXAMETH 3.5-10000-0.1 OP OINT
TOPICAL_OINTMENT | OPHTHALMIC | 0 refills | Status: DC
  Filled 2020-12-05: qty 3.5, 14d supply, fill #0

## 2020-12-23 ENCOUNTER — Telehealth: Payer: Self-pay | Admitting: Neurology

## 2020-12-23 ENCOUNTER — Other Ambulatory Visit: Payer: Self-pay | Admitting: Neurology

## 2020-12-23 ENCOUNTER — Other Ambulatory Visit (HOSPITAL_COMMUNITY): Payer: Self-pay

## 2020-12-23 NOTE — Telephone Encounter (Signed)
Pt called wanting to know are there any other alternatives rather than a CPAP. Pt requesting a call back.

## 2020-12-23 NOTE — Telephone Encounter (Signed)
Called and spoke w/ pt. She states she does not have CPAP machine, never received d/t not being able to afford. She is wanting continued refills on Sonata but unsure what treatment plan for severe OSA would be since she is unable to afford CPAP. I scheduled f/u w/ Dr. Brett Fairy 12/24/20 at 10:30am. Asked she check in by 10/10:15a.

## 2020-12-24 ENCOUNTER — Other Ambulatory Visit (HOSPITAL_COMMUNITY): Payer: Self-pay

## 2020-12-24 ENCOUNTER — Encounter: Payer: Self-pay | Admitting: Neurology

## 2020-12-24 ENCOUNTER — Ambulatory Visit (INDEPENDENT_AMBULATORY_CARE_PROVIDER_SITE_OTHER): Payer: BC Managed Care – PPO | Admitting: Neurology

## 2020-12-24 VITALS — BP 102/72 | HR 88 | Ht 62.0 in | Wt 189.5 lb

## 2020-12-24 DIAGNOSIS — G4733 Obstructive sleep apnea (adult) (pediatric): Secondary | ICD-10-CM

## 2020-12-24 DIAGNOSIS — F5104 Psychophysiologic insomnia: Secondary | ICD-10-CM | POA: Diagnosis not present

## 2020-12-24 DIAGNOSIS — G9331 Postviral fatigue syndrome: Secondary | ICD-10-CM | POA: Diagnosis not present

## 2020-12-24 MED ORDER — ZOLPIDEM TARTRATE ER 12.5 MG PO TBCR
12.5000 mg | EXTENDED_RELEASE_TABLET | Freq: Every evening | ORAL | 1 refills | Status: DC | PRN
  Filled 2020-12-24: qty 30, 30d supply, fill #0
  Filled 2021-01-21: qty 30, 30d supply, fill #1

## 2020-12-24 NOTE — Progress Notes (Signed)
CM sent to [facility]

## 2020-12-24 NOTE — Progress Notes (Signed)
SLEEP MEDICINE CLINIC    Provider:  Larey Seat, MD  Primary Care Physician:  Patient, No Pcp Per (Inactive) No address on file     Referring Provider: [place] Callas, [place]  [name] [place],  [place] [address]          Chief Complaint according to patient   Patient presents with:     New Patient (Initial Visit)           HISTORY OF PRESENT ILLNESS:  Amy Hernandez is a 55 y.o. year old 89 [place] American female patient , who is established with Dr Tomi Likens for general Neurology and presents today for a INSOMNIA evaluation -upon  referral  by [name], NP .   RV on 12/24/2020.: After our initial consultation on 06-25-2020 I had ordered a home sleep test due to the patient's extreme high fatigue score is very low daytime sleepiness score her Epworth sleepiness score was 1 out of 24 and the fatigue score was 58 out of 63 points so we assume that the patient may have an underlying autoimmune or metabolic disorder but I found that the patient's apnea index was actually significant so the overall AHI was 36.4/h in rem sleep 58.4/h and there was no supine sleep recorded.  The patient slept prone and on her left side she had only 7 minutes of hypoxia for the whole duration and normal heart rates.  So an auto titration CPAP device with a setting between 6 and 20 cmH2O with 3 cm EPR, heated humidification and a mask of choice and comfort for the patient will be ordered the treatment of apnea may not influence the insomnia which is of more psychological issues.  So I have strongly recommended her primary care physician to refer for cognitive behavioral therapy.  I will refill Ambien for her one month- hopefully its not too long  until her primary care can get her to cognitive behavior therapy. .         Chief concern according to patient :  " I was Ok on Ambien since my hysterectomy, and very active, until Dr. Carlean Purl felt I need a lower  dose of ambiene, and then my insomnia came back- , from 12.5 to 6.5 mg. I have Barrett's esophagus,  I am soo fatigued now, but I just can't sleep"  " I failed Trazodone, Tylenol PM,  and melatonin - only then was re-prescribed Ambien" . I had acute vision loss with Migraine and have seen the ED and Dr. Tomi Likens, DO".    Amy Hernandez  has a past medical history of Barrett's esophagus - short segment (09/04/2016), Bartholin gland cyst, Breast cancer ([place]), Cervical intraepithelial neoplasia (CIN) (2005), GERD (gastroesophageal reflux disease), Granular cell tumor - distal esophagus - smal (09/04/2016), HIV (human immunodeficiency virus infection) ([place]), Hyperlipidemia, Hypertension, IBS (irritable bowel syndrome), Insomnia, and Substance abuse ([place]).   The patient never had a sleep study.    Sleep relevant medical history: No Nocturia, no Sleep walking, no ENT surgery/ Tonsillectomy. HIV treatment of retroviral.   Family medical /sleep history:  other family member on CPAP with OSA, insomnia, sleep walkers.    Social history:  Patient is working as Warden/ranger person. She  lives in a household with daughter and her grandson.  The patient currently works daytime form home - Pets are present, a cat and a dog. Tobacco use; 10 cig or less a day-.  ETOH use ; none ,  Caffeine intake in form of Coffee( /) Soda( 2 pepsi a day) . Regular exercise in form of step training.    Sleep habits are as follows: The patient's dinner time is between 5-6 PM. The patient goes to bed at  9-10PM and continues to sleep for 7-8 hours while taking Ambien.  Now spending hours in bed before sleep may come.  RACING THOUGHTS. Tried meditation tapes. Never has seen psychologist.   The preferred sleep position is sideways or prone , with the support of 1-2 pillows.  Dreams are reportedly frequent/vivid.  6-7  AM is the usual rise time. The patient wakes up with an alarm, hitting the snooze button- . Averaging 4 hours of  sleep on low dose ambien.  She reports not feeling refreshed or restored in AM, with symptoms such as dry mouth ,and residual fatigue.  Naps are taken infrequently,  She can't initiate sleep.    Review of Systems: Out of a complete 14 system review, the patient complains of only the following symptoms, and all other reviewed systems are negative.:  Fatigue,fragmented sleep,chronic  Insomnia.   How likely are you to doze in the following situations: 0 = not likely, 1 = slight chance, 2 = moderate chance, 3 = high chance   Sitting and Reading? Watching Television? Sitting inactive in a public place (theater or meeting)? As a passenger in a car for an hour without a break? Lying down in the afternoon when circumstances permit? Sitting and talking to someone? Sitting quietly after lunch without alcohol? In a car, while stopped for a few minutes in traffic?   Total = ONE / 24 points   FSS endorsed at  58/ 63 points. VERY HIGH DEGREE OF FATIGUE>   Social History   Socioeconomic History   Marital status: Legally Separated    Spouse name: Not on file   Number of children: 2   Years of education: Not on file   Highest education level: Not on file  Occupational History   Occupation: Customer service rep    Employer: [employer]  Tobacco Use   Smoking status: Every Day    Packs/day: 0.25    Years: 30.00    Pack years: 7.50    Types: Cigarettes   Smokeless tobacco: Never   Tobacco comments:     pt. trying to quit  Vaping Use   Vaping Use: Never used  Substance and Sexual Activity   Alcohol use: No    Alcohol/week: 0.0 standard drinks   Drug use: No   Sexual activity: Not Currently    Partners: Male    Comment:  declined condoms  Other Topics Concern   Not on file  Social History Narrative   Married but legally separated, 2 daughters grandchild at home   Customer service rep for [employer]   No  alcohol or drug use   She is a smoker   Social Determinants of Adult nurse Strain: Not on file  Food Insecurity: Not on file  Transportation Needs: Not on file  Physical Activity: Not on file  Stress: Not on file  Social Connections: Not on file    Family History  Problem Relation Age of Onset   Diabetes Mother    Hyperlipidemia Mother    Hypertension Mother    Cancer Father        bladder cancer    Heart disease Father    Hypertension Father    Breast  cancer Maternal Grandmother    Diabetes Maternal Grandmother    Hyperlipidemia Maternal Grandmother    Colon cancer Neg Hx    Esophageal cancer Neg Hx    Rectal cancer Neg Hx    Stomach cancer Neg Hx     Past Medical History:  Diagnosis Date   Barrett's esophagus - short segment 09/04/2016   Bartholin gland cyst    Breast cancer ([place])    Cervical intraepithelial neoplasia (CIN) 2005   high grade cervical dysplasia prior to total hysterectomy - needs yearly vaginal paps    GERD (gastroesophageal reflux disease)    Granular cell tumor - distal esophagus - smal 09/04/2016   HIV (human immunodeficiency virus infection) ([place])    Hyperlipidemia    Hypertension    IBS (irritable bowel syndrome)    Insomnia    Substance abuse ([place])     Past Surgical History:  Procedure Laterality Date   ABDOMINAL HYSTERECTOMY  09/2003   ANAL SPHINCTEROTOMY     CHOLECYSTECTOMY  1990   COLONOSCOPY  2009   NL   ESOPHAGOGASTRODUODENOSCOPY  2018   LEEP  2000   history of CIN    TUBAL LIGATION       Current Outpatient Medications on File Prior to Visit  Medication Sig Dispense Refill   albuterol (VENTOLIN HFA) 108 (90 Base) MCG/ACT inhaler Inhale 1 puff into the lungs every 6 (six) hours as needed for wheezing or shortness of breath. 8.5 g 3   bictegravir-emtricitabine-tenofovir AF (BIKTARVY) 50-200-25 MG TABS tablet TAKE 1 TABLET BY MOUTH DAILY. TRY TO TAKE AT THE SAME TIME EACH DAY WITH OR WITHOUT FOOD. 30 tablet 11   buPROPion (WELLBUTRIN XL) 150 MG 24 hr tablet Take 1 tablet (150 mg  total) by mouth daily. 30 tablet 5   fluticasone (FLONASE) 50 MCG/ACT nasal spray Place 1-2 sprays into both nostrils daily. 16 g 0   lisinopril-hydrochlorothiazide (ZESTORETIC) 20-25 MG tablet TAKE 1 TABLET BY MOUTH DAILY. 90 tablet 3   neomycin-polymyxin b-dexamethasone (MAXITROL) 3.5-10000-0.1 OINT Apply to right eye at bedtime for 1-2 weeks 3.5 g 0   omeprazole (PRILOSEC) 40 MG capsule TAKE 1 CAPSULE BY MOUTH ONCE A DAY 90 capsule 2   potassium chloride SA (KLOR-CON) 20 MEQ tablet TAKE 1 TABLET BY MOUTH ONCE DAILY 90 tablet 3   simvastatin (ZOCOR) 20 MG tablet TAKE 1 TABLET BY MOUTH ONCE A DAY 30 tablet 3   zaleplon (SONATA) 10 MG capsule Take 1 capsule (10 mg total) by mouth at bedtime as needed for sleep 30 capsule 1   No current facility-administered medications on file prior to visit.    Allergies  Allergen Reactions   Codeine     REACTION: hallucinations    Physical exam:  Today's Vitals   12/24/20 1028  BP: 102/72  Pulse: 88  Weight: 189 lb 8 oz (86 kg)  Height: _0  (1.575 m)   Body mass index is 34.66 kg/m.   Wt Readings from Last 3 Encounters:  12/24/20 189 lb 8 oz (86 kg)  11/12/20 191 lb 9.6 oz (86.9 kg)  06/25/20 200 lb (90.7 kg)     Ht Readings from Last 3 Encounters:  12/24/20 _1  (1.575 m)  11/12/20 _2  (1.575 m)  06/25/20 _3  (1.575 m)      General: The patient is awake, alert and appears not in acute distress. The patient is well groomed. Head: Normocephalic, atraumatic. Neck is supple. Mallampati 1,  neck circumference:15.5  inches .  Nasal airflow patent.  Retrognathia is  seen.  Dental status: biological teeth Cardiovascular:  Regular rate and cardiac rhythm by pulse,  without distended neck veins. Respiratory: Lungs are clear to auscultation.  Skin:  Without evidence of ankle edema, or rash. Trunk: The patient's posture is erect.   Neurologic exam : The patient is awake and alert, oriented to place and time.   Memory subjective  described as intact.  Attention span & concentration ability appears normal.  Speech is fluent,  without  dysarthria, dysphonia or aphasia.  Mood and affect are appropriate.   Cranial nerves: no loss of smell or taste reported  Pupils are equal and briskly reactive to light. Funduscopic exam deferred. .  Extraocular movements in vertical and horizontal planes were intact and without nystagmus. No Diplopia. Visual fields by finger perimetry are intact. Hearing was impaired on the left ear.   Facial sensation intact to fine touch.  Facial motor strength is symmetric and tongue and uvula move midline.  Neck ROM : rotation, tilt and flexion extension were normal for age and shoulder shrug was symmetrical.    Motor exam:  Symmetric bulk, tone and ROM.   Normal tone without cog- wheeling, symmetric grip strength .   Sensory:  Fine touch and vibration werenormal.  Proprioception tested in the upper extremities was normal.   Coordination: Rapid alternating movements in the fingers/hands were of normal speed. No changes in penmanship.   Gait and station: Patient could rise unassisted from a seated position, walked without assistive device.  Stance is of normal width/ base and the patient turned with 3 steps.  Toe and heel walk were deferred.  Deep tendon reflexes: in the  upper and lower extremities are symmetric, brisk .  Babinski response was deferred.      After spending a total time of  40  minutes face to face and additional time for physical and neurologic examination, review of laboratory studies,  personal review of imaging studies, reports and results of other testing and review of referral information / records as far as provided in visit, I have established the following assessments:  Patient with insomnia, dependent on Ambien now, reduction in dose has been met with re-onset of INSOMNIA, CHRONIC.    Hysterectomy 2006 is the patient's associated point of insomnia onset. Still  having some hot flashes. Still sweating at night. Recovering drug addict.  Has already set good routines for meal times, bedtimes and a dark, cool, and quiet bedroom.   1) Racing thoughts keeping her form falling asleep, anxious and stressed. She is feeling as if the likelihood of sleep onset decreased with every look at the clock.   2) Computer screen lights are off before bedtime, TV on in bedroom is switched off before she wants to sleep. Hot bath / shower before bedtime-  waking up exhausted, feeling exhausted to drive in AM, too tired to take a shower -   30 smoker who snores (!) and has been unable to quit- consider Wellbutrin 150 mg in AM as energy supplier - she has no history of seizures.    My Plan is to proceed with:  1) referral by referring physician /PCP to cognitive behavior therapy, [place] for chronic insomnia. Anxiety as a possible cause. This is not neurological / organic cause / sleep medicine .    2)  So an auto titration CPAP device with a setting between 6 and 20 cmH2O with 3 cm EPR, heated humidification and a mask of  choice and comfort for the patient will be ordered.     the treatment of apnea may not influence the insomnia which is of more psychological issues.  So I have strongly recommended her primary care physician to refer for cognitive behavioral therapy.  I will refill Ambien for her one month- hopefully its not too long  until her primary care can get her to cognitive behavior therapy.   I would like to thank Dr Tomi Likens / Dr. Linus Salmons for this encounter. for allowing me to meet with and to take care of this pleasant patient.   In short, Amy Hernandez is presenting with chronic insomnia - over 15 years or more. She has also severe OSA and is not on CPAP as she could not afford it- but will let me order a CPAP , possibly a refurbished.   I plan to follow up  through our NP within 4 months, this is not an MD visit. Marland Kitchen   PCP will be asked to refer to CBT - cognitive  behavior therapy.    Electronically signed by: Larey Seat, MD 12/24/2020 11:05 AM  [facility] Board certified by The [employer] of Sleep Medicine and Diplomate of the [employer] of Sleep Medicine. Board certified In Neurology through the [place], Fellow of the [employer] of Neurology. Medical Director of [employer].

## 2020-12-30 ENCOUNTER — Other Ambulatory Visit (HOSPITAL_COMMUNITY): Payer: Self-pay

## 2020-12-30 ENCOUNTER — Other Ambulatory Visit: Payer: Self-pay | Admitting: Infectious Diseases

## 2020-12-30 DIAGNOSIS — E785 Hyperlipidemia, unspecified: Secondary | ICD-10-CM

## 2020-12-30 MED ORDER — SIMVASTATIN 20 MG PO TABS
ORAL_TABLET | Freq: Every day | ORAL | 3 refills | Status: DC
  Filled 2020-12-30: qty 30, 30d supply, fill #0
  Filled 2021-02-04: qty 30, 30d supply, fill #1
  Filled 2021-03-13: qty 30, 30d supply, fill #2
  Filled 2021-04-02: qty 30, 30d supply, fill #3

## 2021-01-01 ENCOUNTER — Other Ambulatory Visit (HOSPITAL_COMMUNITY): Payer: Self-pay

## 2021-01-21 ENCOUNTER — Other Ambulatory Visit (HOSPITAL_COMMUNITY): Payer: Self-pay

## 2021-01-28 ENCOUNTER — Other Ambulatory Visit (HOSPITAL_COMMUNITY): Payer: Self-pay

## 2021-01-29 DIAGNOSIS — G4733 Obstructive sleep apnea (adult) (pediatric): Secondary | ICD-10-CM | POA: Diagnosis not present

## 2021-02-04 ENCOUNTER — Other Ambulatory Visit (HOSPITAL_COMMUNITY): Payer: Self-pay

## 2021-02-06 ENCOUNTER — Telehealth: Payer: Self-pay | Admitting: Internal Medicine

## 2021-02-06 NOTE — Telephone Encounter (Signed)
Good Afternoon Dr. Carlean Purl,   Patient called requesting to switch providers and start care with Dr. Silverio Decamp.  Are you okay with the switch? Please advise.

## 2021-02-07 NOTE — Telephone Encounter (Signed)
Dr. Fleeta Emmer, are you okay with the switch?

## 2021-02-07 NOTE — Telephone Encounter (Signed)
ok 

## 2021-02-07 NOTE — Telephone Encounter (Signed)
Yes

## 2021-02-18 ENCOUNTER — Telehealth: Payer: Self-pay | Admitting: Neurology

## 2021-02-18 NOTE — Telephone Encounter (Signed)
Called pt. Scheduled initial cpap f/u for 04/15/21 at 3:30pm with Dr. Brett Fairy.  Reviewed last office note and made her aware that Dr. Brett Fairy was only refilling zolpidem x1 month and she was to speak w/ PCP about referral to cognitive behavioral therapy. Pt just made appt w/ PCP for July. I recommended she call them to be placed on wait list for sooner appt. She verbalized understanding.

## 2021-02-18 NOTE — Telephone Encounter (Signed)
Pt states she began using her CPAP on or around 01-29-21 phone rep tried scheduling initial CPAP f/u with all NP's no availability on with schedule.  Dr Brett Fairy did not have anything within needed time frame either.  Please advise with suggestion.  Pt also wants to know if Dr Brett Fairy will renew her Ambien so she can take that while using CPAP, Please call.

## 2021-03-01 DIAGNOSIS — G4733 Obstructive sleep apnea (adult) (pediatric): Secondary | ICD-10-CM | POA: Diagnosis not present

## 2021-03-04 ENCOUNTER — Other Ambulatory Visit: Payer: Self-pay

## 2021-03-04 ENCOUNTER — Telehealth (INDEPENDENT_AMBULATORY_CARE_PROVIDER_SITE_OTHER): Payer: BC Managed Care – PPO | Admitting: Infectious Diseases

## 2021-03-04 ENCOUNTER — Other Ambulatory Visit (HOSPITAL_COMMUNITY): Payer: Self-pay

## 2021-03-04 ENCOUNTER — Encounter: Payer: Self-pay | Admitting: Infectious Diseases

## 2021-03-04 DIAGNOSIS — B20 Human immunodeficiency virus [HIV] disease: Secondary | ICD-10-CM

## 2021-03-04 DIAGNOSIS — F5101 Primary insomnia: Secondary | ICD-10-CM

## 2021-03-04 MED ORDER — ZOLPIDEM TARTRATE ER 12.5 MG PO TBCR
12.5000 mg | EXTENDED_RELEASE_TABLET | Freq: Every evening | ORAL | 5 refills | Status: DC | PRN
Start: 2021-03-04 — End: 2021-08-28
  Filled 2021-03-04: qty 30, 30d supply, fill #0
  Filled 2021-04-02: qty 30, 30d supply, fill #1
  Filled 2021-05-01: qty 30, 30d supply, fill #2
  Filled 2021-05-29: qty 30, 30d supply, fill #3
  Filled 2021-06-30: qty 30, 30d supply, fill #4
  Filled 2021-07-28: qty 30, 30d supply, fill #5

## 2021-03-04 NOTE — Progress Notes (Signed)
Patient Name: Amy Hernandez  Date of Birth: October 21, 1965 MRN: [id number]  PCP: Thayer Headings, MD    VIRTUAL CARE ENCOUNTER  I connected with Amy Hernandez on 03/04/21 at  8:45 AM EST by TELEPHONE and verified that I am speaking with the correct person using two identifiers.   I discussed the limitations, risks, security and privacy concerns of performing an evaluation and management service by telephone and the availability of in person appointments. I also discussed with the patient that there may be a patient responsible charge related to this service. The patient expressed understanding and agreed to proceed.  Patient Location: [facility]   Other Participants:   Provider Location: [facility] Office     SUBJECTIVE:  Brief Narrative:  Amy Hernandez is a 56 y.o. female with well controlled HIV on Biktarvy. HIV Risk: heterosexual, OI Hx: none  Previous Regimens:  Atripla - well controlled but required switch d/t insurance  Odefsey - did not like s/e or food requirement  Biktarvy 06/2017 - suppressed   Genotype:  sensitive     CC:  Chief Complaint  Patient presents with   Follow-up   Insomnia     HPI: Wanted to go over what's been going on with her over the last few months - Dr. Brett Hernandez recommended CPAP and CBT. She cannot afford CBT at this time and describes she is desperate to get sleep in the setting of severe sleep deprivation which has impacted ability to tolerate CPAP at night. She does not have the funds to continue with CBT or CPAP at the time. Her best sleep has been combining the CPAP with the ambien.   Switched to Dr. Silverio Hernandez with GI  Has regained some weight and down to 185 now. Paying attention to what she is eating and how she is feeling after that. She has had some improvement in symptoms but overall does feel as if there have been some changes in her hernia.   She is due for tetanus vaccine. Has had boosters for COVID19. Never had Shingles vaccines either.    ROS    Past Medical History:  Diagnosis Date   Barrett's esophagus - short segment 09/04/2016   Bartholin gland cyst    Breast cancer ([place])    Cervical intraepithelial neoplasia (CIN) 2005   high grade cervical dysplasia prior to total hysterectomy - needs yearly vaginal paps    GERD (gastroesophageal reflux disease)    Granular cell tumor - distal esophagus - smal 09/04/2016   HIV (human immunodeficiency virus infection) ([place])    Hyperlipidemia    Hypertension    IBS (irritable bowel syndrome)    Insomnia    Substance abuse ([place])    Outpatient Medications Prior to Visit  Medication Sig Dispense Refill   albuterol (VENTOLIN HFA) 108 (90 Base) MCG/ACT inhaler Inhale 1 puff into the lungs every 6 (six) hours as needed for wheezing or shortness of breath. 8.5 g 3   bictegravir-emtricitabine-tenofovir AF (BIKTARVY) 50-200-25 MG TABS tablet TAKE 1 TABLET BY MOUTH DAILY. TRY TO TAKE AT THE SAME TIME EACH DAY WITH OR WITHOUT FOOD. 30 tablet 11   buPROPion (WELLBUTRIN XL) 150 MG 24 hr tablet Take 1 tablet (150 mg total) by mouth daily. 30 tablet 5   lisinopril-hydrochlorothiazide (ZESTORETIC) 20-25 MG tablet TAKE 1 TABLET BY MOUTH DAILY. 90 tablet 3   omeprazole (PRILOSEC) 40 MG capsule TAKE 1 CAPSULE BY MOUTH ONCE A DAY 90 capsule 2   potassium chloride SA (  KLOR-CON M) 20 MEQ tablet TAKE 1 TABLET BY MOUTH ONCE DAILY 90 tablet 3   simvastatin (ZOCOR) 20 MG tablet TAKE 1 TABLET BY MOUTH ONCE A DAY 30 tablet 3   fluticasone (FLONASE) 50 MCG/ACT nasal spray Place 1-2 sprays into both nostrils daily. (Patient not taking: Reported on 03/04/2021) 16 g 0   neomycin-polymyxin b-dexamethasone (MAXITROL) 3.5-10000-0.1 OINT Apply to right eye at bedtime for 1-2 weeks (Patient not taking: Reported on 03/04/2021) 3.5 g 0   zolpidem (AMBIEN CR) 12.5 MG CR tablet Take 1 tablet (12.5 mg total) by mouth at bedtime as needed for sleep. (Patient not taking: Reported on 03/04/2021) 30 tablet 1   No  facility-administered medications prior to visit.    Allergies  Allergen Reactions   Codeine     REACTION: hallucinations    Physical Assessment & Diagnostic Findings: Vitals:   03/04/21 0854  Weight: 185 lb (83.9 kg)   Body mass index is 33.84 kg/m.   Physical Exam Deferred as our visit was spent in direct counseling    Lab Results Lab Results  Component Value Date   CREATININE 1.27 (H) 06/12/2020   BUN 14 06/12/2020   NA 139 06/12/2020   K 3.6 06/12/2020   CL 100 06/12/2020   CO2 27 06/12/2020    Lab Results  Component Value Date   ALT 38 (H) 06/12/2020   AST 59 (H) 06/12/2020   ALKPHOS 78 09/21/2019   BILITOT 0.5 06/12/2020    Lab Results  Component Value Date   CHOL 173 04/03/2019   HDL 40 (L) 04/03/2019   LDLCALC 104 (H) 04/03/2019   TRIG 172 (H) 04/03/2019   CHOLHDL 4.3 04/03/2019   HIV 1 RNA Quant  Date Value  11/12/2020 Not Detected Copies/mL  05/07/2020 Not Detected Copies/mL  04/03/2019 <20 NOT DETECTED copies/mL   CD4 T Cell Abs (/uL)  Date Value  11/12/2020 1,224  05/07/2020 1,243  04/03/2019 1,302   Problem List Items Addressed This Visit       Unprioritized   Human immunodeficiency virus (HIV) disease ([place]) (Chronic)    Routine follow up scheduled in October.       Insomnia    She is very under slept and concerned about her health if she is not able to get some sleep. Unable to tolerate CPAP while she can't sleep - sleeps very well with combining the two. Unable to participate in CBT at this moment but willing to work on this when she can afford it.  Amy Hernandez has never misused the Ambien in the past. I would like for her to continue the CPAP. I am not sure how to arrange referral for CBT for sleep but she has a new patient appointment with primary care coming up in July. I do think CBT may be helpful and explained this is not to address depression or mood component; CBT can be used for a lot of things (sleep hygiene, overeating, etc).   Will resume her Ambien 12.5 mg CR until she can discuss further with primary care team.        Follow Up Instructions: As above   I discussed the assessment and treatment plan with the patient. The patient was provided an opportunity to ask questions and all were answered. The patient agreed with the plan and demonstrated an understanding of the instructions.   The patient was advised to call back or seek an in-person evaluation if the symptoms worsen or if the condition fails to improve as  anticipated.  I provided 14 minutes of non-face-to-face time during this encounter.   [name], MSN, NP-C [facility] [email or web address] Pager: [phone] Office: [phone] [place]: [phone]

## 2021-03-04 NOTE — Assessment & Plan Note (Addendum)
She is very under slept and concerned about her health if she is not able to get some sleep. Unable to tolerate CPAP while she can't sleep - sleeps very well with combining the two. Unable to participate in CBT at this moment but willing to work on this when she can afford it.  Amy Hernandez has never misused the Ambien in the past. I would like for her to continue the CPAP. I am not sure how to arrange referral for CBT for sleep but she has a new patient appointment with primary care coming up in July. I do think CBT may be helpful and explained this is not to address depression or mood component; CBT can be used for a lot of things (sleep hygiene, overeating, etc).  Will resume her Ambien 12.5 mg CR until she can discuss further with primary care team.

## 2021-03-04 NOTE — Assessment & Plan Note (Signed)
Routine follow up scheduled in October.

## 2021-03-12 ENCOUNTER — Other Ambulatory Visit (HOSPITAL_COMMUNITY): Payer: Self-pay

## 2021-03-13 ENCOUNTER — Other Ambulatory Visit (HOSPITAL_COMMUNITY): Payer: Self-pay

## 2021-03-20 ENCOUNTER — Ambulatory Visit (INDEPENDENT_AMBULATORY_CARE_PROVIDER_SITE_OTHER): Payer: BC Managed Care – PPO | Admitting: Gastroenterology

## 2021-03-20 ENCOUNTER — Encounter: Payer: Self-pay | Admitting: Gastroenterology

## 2021-03-20 ENCOUNTER — Other Ambulatory Visit (HOSPITAL_COMMUNITY): Payer: Self-pay

## 2021-03-20 ENCOUNTER — Other Ambulatory Visit (INDEPENDENT_AMBULATORY_CARE_PROVIDER_SITE_OTHER): Payer: BC Managed Care – PPO

## 2021-03-20 VITALS — BP 104/74 | HR 87 | Ht 62.0 in | Wt 191.5 lb

## 2021-03-20 DIAGNOSIS — K580 Irritable bowel syndrome with diarrhea: Secondary | ICD-10-CM

## 2021-03-20 DIAGNOSIS — K219 Gastro-esophageal reflux disease without esophagitis: Secondary | ICD-10-CM

## 2021-03-20 DIAGNOSIS — R1013 Epigastric pain: Secondary | ICD-10-CM

## 2021-03-20 DIAGNOSIS — R7989 Other specified abnormal findings of blood chemistry: Secondary | ICD-10-CM | POA: Diagnosis not present

## 2021-03-20 DIAGNOSIS — G8929 Other chronic pain: Secondary | ICD-10-CM

## 2021-03-20 DIAGNOSIS — K2270 Barrett's esophagus without dysplasia: Secondary | ICD-10-CM | POA: Diagnosis not present

## 2021-03-20 DIAGNOSIS — K529 Noninfective gastroenteritis and colitis, unspecified: Secondary | ICD-10-CM

## 2021-03-20 LAB — HEPATIC FUNCTION PANEL
ALT: 23 U/L (ref 0–35)
AST: 27 U/L (ref 0–37)
Albumin: 4.3 g/dL (ref 3.5–5.2)
Alkaline Phosphatase: 85 U/L (ref 39–117)
Bilirubin, Direct: 0 mg/dL (ref 0.0–0.3)
Total Bilirubin: 0.3 mg/dL (ref 0.2–1.2)
Total Protein: 7.4 g/dL (ref 6.0–8.3)

## 2021-03-20 LAB — SEDIMENTATION RATE: Sed Rate: 27 mm/hr (ref 0–30)

## 2021-03-20 LAB — HIGH SENSITIVITY CRP: CRP, High Sensitivity: 5.29 mg/L — ABNORMAL HIGH (ref 0.000–5.000)

## 2021-03-20 MED ORDER — COLESEVELAM HCL 625 MG PO TABS
625.0000 mg | ORAL_TABLET | Freq: Three times a day (TID) | ORAL | 2 refills | Status: DC
  Filled 2021-03-20: qty 90, 30d supply, fill #0
  Filled 2021-04-02: qty 90, 30d supply, fill #1

## 2021-03-20 NOTE — Progress Notes (Signed)
Amy Hernandez    [id number]    09-11-1965  Primary Care Physician:Comer, Okey Regal, MD  Referring Physician: Thayer Headings, MD [address] [place],  [place] [address]   Chief complaint: Epigastric abdominal pain, diarrhea  HPI: 56 year old very pleasant female here for follow-up visit with complaints of epigastric abdominal pain and intermittent diarrhea.  She has been experiencing intermittent diarrhea with increased fecal urgency associated with abdominal bloating and gas, worse in the past year.  She also has intermittent epigastric abdominal pain  She is s/p cholecystectomy, history of HIV with undetectable viral load on treatment  Denies any rectal bleeding or melena.  No vomiting.  EGD September 29, 2019 by Dr. Carlean Purl - Tortuous esophagus. - Benign-appearing esophageal stenosis. Dilated. - LA Grade A reflux esophagitis with no bleeding. Biopsied. - 5 cm hiatal hernia. - Normal stomach. - Normal examined duodenum. Biopsied. 1. Surgical [P], duodenal bxs - DUODENAL MUCOSA WITH FOCAL, MILD CHANGES OF PEPTIC INJURY. - NO FEATURES OF CELIAC SPRUE OR GRANULOMAS. 2. Surgical [P], distal esophagus - INTESTINAL METAPLASIA CONSISTENT WITH BARRETT'S ESOPHAGUS WITH FOCAL GLANDULAR ATYPIA. - SEE MICROSCOPIC DESCRIPTION  EGD August 17, 2018 - Nodule found in the esophagus. Biopsied. - Esophageal mucosal changes suggestive of short-segment Barrett's esophagus. Biopsied. - Small hiatal hernia. - Gastritis. Biopsied. - Mucosal changes in the duodenum. Biopsied. - The examination was otherwise normal.  Colonoscopy August 17, 2018 - Diverticulosis in the left colon. - The examination was otherwise normal on direct and retroflexion views. - No specimens collected.  1. Surgical [P], distal esophagus - SQUAMOCOLUMNAR JUNCTION WITH ULCERATION, ACUTE AND CHRONIC INFLAMMATION - NO INTESTINAL METAPLASIA, DYSPLASIA OR MALIGNANCY IDENTIFIED 2. Surgical [P], distal  esophagus - REACTIVE SQUAMOUS MUCOSA WITH INCREASED INTRAEPITHELIAL LYMPHOCYTES - NO MALIGNANCY IDENTIFIED   CT abd & pelvis 09/25/19 No acute findings within the abdomen or pelvis.    Stable small epigastric ventral hernia containing only omental fat.   Aortic Atherosclerosis (ICD10-I70.0).  Outpatient Encounter Medications as of 03/20/2021  Medication Sig   albuterol (VENTOLIN HFA) 108 (90 Base) MCG/ACT inhaler Inhale 1 puff into the lungs every 6 (six) hours as needed for wheezing or shortness of breath.   bictegravir-emtricitabine-tenofovir AF (BIKTARVY) 50-200-25 MG TABS tablet TAKE 1 TABLET BY MOUTH DAILY. TRY TO TAKE AT THE SAME TIME EACH DAY WITH OR WITHOUT FOOD.   buPROPion (WELLBUTRIN XL) 150 MG 24 hr tablet Take 1 tablet (150 mg total) by mouth daily.   fluticasone (FLONASE) 50 MCG/ACT nasal spray Place 1-2 sprays into both nostrils daily.   lisinopril-hydrochlorothiazide (ZESTORETIC) 20-25 MG tablet TAKE 1 TABLET BY MOUTH DAILY.   omeprazole (PRILOSEC) 40 MG capsule TAKE 1 CAPSULE BY MOUTH ONCE A DAY   potassium chloride SA (KLOR-CON M) 20 MEQ tablet TAKE 1 TABLET BY MOUTH ONCE DAILY   simvastatin (ZOCOR) 20 MG tablet TAKE 1 TABLET BY MOUTH ONCE A DAY   zolpidem (AMBIEN CR) 12.5 MG CR tablet Take 1 tablet by mouth at bedtime as needed for sleep.   [DISCONTINUED] neomycin-polymyxin b-dexamethasone (MAXITROL) 3.5-10000-0.1 OINT Apply to right eye at bedtime for 1-2 weeks (Patient not taking: Reported on 03/04/2021)   No facility-administered encounter medications on file as of 03/20/2021.    Allergies as of 03/20/2021 - Review Complete 03/20/2021  Allergen Reaction Noted   Codeine      Past Medical History:  Diagnosis Date   Barrett's esophagus - short segment 09/04/2016  Bartholin gland cyst    Breast cancer ([place])    Cervical intraepithelial neoplasia (CIN) 2005   high grade cervical dysplasia prior to total hysterectomy - needs yearly vaginal paps    GERD  (gastroesophageal reflux disease)    Granular cell tumor - distal esophagus - smal 09/04/2016   HIV (human immunodeficiency virus infection) ([place])    Hyperlipidemia    Hypertension    IBS (irritable bowel syndrome)    Insomnia    Substance abuse ([place])     Past Surgical History:  Procedure Laterality Date   ABDOMINAL HYSTERECTOMY  09/2003   [place]   COLONOSCOPY  2009   NL   ESOPHAGOGASTRODUODENOSCOPY  2018   LEEP  2000   history of CIN    TUBAL LIGATION      Family History  Problem Relation Age of Onset   Diabetes Mother    Hyperlipidemia Mother    Hypertension Mother    Cancer Father        bladder cancer    Heart disease Father    Hypertension Father    Breast cancer Maternal Grandmother    Diabetes Maternal Grandmother    Hyperlipidemia Maternal Grandmother    Colon cancer Neg Hx    Esophageal cancer Neg Hx    Rectal cancer Neg Hx    Stomach cancer Neg Hx     Social History   Socioeconomic History   Marital status: Legally Separated    Spouse name: Not on file   Number of children: 2   Years of education: Not on file   Highest education level: Not on file  Occupational History   Occupation: Customer service rep    Employer: [employer]  Tobacco Use   Smoking status: Every Day    Packs/day: 0.30    Years: 30.00    Pack years: 9.00    Types: Cigarettes   Smokeless tobacco: Never   Tobacco comments:    Pt states she is working on quitting  Scientific laboratory technician Use: Never used  Substance and Sexual Activity   Alcohol use: No    Alcohol/week: 0.0 standard drinks   Drug use: No   Sexual activity: Not Currently    Partners: Male    Comment:  declined condoms  Other Topics Concern   Not on file  Social History Narrative   Married but legally separated, 2 daughters grandchild at home   Customer service rep for [employer]   No  alcohol or drug use   She is a smoker   Social Determinants of Adult nurse Strain: Not on file  Food Insecurity: Not on file  Transportation Needs: Not on file  Physical Activity: Not on file  Stress: Not on file  Social Connections: Not on file  Intimate Partner Violence: Not on file      Review of systems: All other review of systems negative except as mentioned in the HPI.   Physical Exam: Vitals:   03/20/21 0856  BP: 104/74  Pulse: 87  SpO2: 98%   Body mass index is 35.03 kg/m. Gen:      No acute distress HEENT:  sclera anicteric Abd:      soft, non-tender; no palpable masses, no distension Ext:    No edema Neuro: alert and oriented x 3 Psych: normal mood and affect  Data Reviewed:  Reviewed labs, radiology imaging, old records and pertinent past GI work up  Assessment and Plan/Recommendations:  56 year old very pleasant female with history of hypertension, HIV on treatment, chronic GERD with Barrett's esophagus with complaints of epigastric abdominal pain and chronic diarrhea  Epigastric abdominal pain: She had mild elevation in transaminases in May 2022 We will need to exclude retained CBD stone or fatty liver Recheck LFT Obtain right upper quadrant abdominal ultrasound Check hepatitis panel ANA, anti-smooth muscle antibody  Barrett's esophagus and chronic GERD: Due for recall surveillance EGD in September 2024 Continue omeprazole and antireflux measures  If continues to have persistent epigastric abdominal pain and above work-up is unrevealing, will consider EGD for further evaluation  IBS diarrhea with fecal urgency Check TTG IgA antibody to exclude celiac disease Check fecal lactoferrin, CRP and ESR to exclude inflammatory bowel disease GI profile to exclude infectious etiology Check fecal elastase to exclude pancreatic insufficiency Empiric trial with WelChol 1 tablet 3 times daily for possible bile salt induced diarrhea  If continues to have persistent diarrhea and above work-up is negative, plan  for colonoscopy with biopsies to exclude microscopic colitis  Return in 2 to 3 months   The patient was provided an opportunity to ask questions and all were answered. The patient agreed with the plan and demonstrated an understanding of the instructions.  Damaris Hippo , MD    CC: Thayer Headings, MD

## 2021-03-20 NOTE — Patient Instructions (Signed)
If you are age 54 or older, your body mass index should be between 23-30. Your Body mass index is 35.03 kg/m. If this is out of the aforementioned range listed, please consider follow up with your Primary Care Provider.  If you are age 54 or younger, your body mass index should be between 19-25. Your Body mass index is 35.03 kg/m. If this is out of the aformentioned range listed, please consider follow up with your Primary Care Provider.   ________________________________________________________  The [facility] providers would like to encourage you to use [facility] to communicate with providers for non-urgent requests or questions.  Due to long hold times on the telephone, sending your provider a message by [facility] may be a faster and more efficient way to get a response.  Please allow 48 business hours for a response.  Please remember that this is for non-urgent requests.  _______________________________________________________  Your provider has requested that you go to the basement level for lab work before leaving today. Press "B" on the elevator. The lab is located at the first door on the left as you exit the elevator.  You have been scheduled for an abdominal ultrasound at [facility] (1st floor of hospital) on 03-25-2021 at 9:30am. Please arrive 15 minutes prior to your appointment for registration. Make certain not to have anything to eat or drink 6 hours prior to your appointment. Should you need to reschedule your appointment, please contact radiology at [phone]. This test typically takes about 30 minutes to perform.  Due to recent changes in healthcare laws, you may see the results of your imaging and laboratory studies on MyChart before your provider has had a chance to review them.  We understand that in some cases there may be results that are confusing or concerning to you. Not all laboratory results come back in the same time frame and the provider may be waiting for  multiple results in order to interpret others.  Please give [place] 48 hours in order for your provider to thoroughly review all the results before contacting the office for clarification of your results.    Start the [facility] 1 tablet three times a day, before meals, avoid within 2 hours of other medications.   It was a pleasure to see you today!  Thank you for trusting me with your gastrointestinal care!

## 2021-03-23 LAB — ANTI-NUCLEAR AB-TITER (ANA TITER): ANA Titer 1: 1:40 {titer} — ABNORMAL HIGH

## 2021-03-23 LAB — IGA: Immunoglobulin A: 167 mg/dL (ref 47–310)

## 2021-03-23 LAB — HEPATITIS B SURFACE ANTIBODY,QUALITATIVE: Hep B S Ab: NONREACTIVE

## 2021-03-23 LAB — HEPATITIS C ANTIBODY
Hepatitis C Ab: NONREACTIVE
SIGNAL TO CUT-OFF: 0.16 (ref <1.00)

## 2021-03-23 LAB — ANTI-SMOOTH MUSCLE ANTIBODY, IGG: Actin (Smooth Muscle) Antibody (IGG): <20 U (ref <20)

## 2021-03-23 LAB — HEPATITIS B SURFACE ANTIGEN: Hepatitis B Surface Ag: NONREACTIVE

## 2021-03-23 LAB — ANA: Anti Nuclear Antibody (ANA): POSITIVE — AB

## 2021-03-23 LAB — TISSUE TRANSGLUTAMINASE ABS,IGG,IGA
(tTG) Ab, IgA: <1 U/mL
(tTG) Ab, IgG: 1.5 U/mL

## 2021-03-23 LAB — HEPATITIS A ANTIBODY, TOTAL: Hepatitis A AB,Total: REACTIVE — AB

## 2021-03-25 ENCOUNTER — Ambulatory Visit (HOSPITAL_COMMUNITY)
Admission: RE | Admit: 2021-03-25 | Discharge: 2021-03-25 | Disposition: A | Discharge status: Home / Self Care | Payer: BC Managed Care – PPO | Source: Ambulatory Visit | Attending: Gastroenterology | Admitting: Gastroenterology

## 2021-03-25 ENCOUNTER — Encounter: Payer: Self-pay | Admitting: Emergency Medicine

## 2021-03-25 ENCOUNTER — Other Ambulatory Visit (HOSPITAL_COMMUNITY): Payer: Self-pay

## 2021-03-25 ENCOUNTER — Other Ambulatory Visit: Payer: Self-pay

## 2021-03-25 ENCOUNTER — Ambulatory Visit (INDEPENDENT_AMBULATORY_CARE_PROVIDER_SITE_OTHER): Payer: BC Managed Care – PPO

## 2021-03-25 ENCOUNTER — Ambulatory Visit
Admission: EM | Admit: 2021-03-25 | Discharge: 2021-03-25 | Disposition: A | Discharge status: Home / Self Care | Payer: BC Managed Care – PPO | Attending: Physician Assistant | Admitting: Physician Assistant

## 2021-03-25 DIAGNOSIS — M79671 Pain in right foot: Secondary | ICD-10-CM

## 2021-03-25 DIAGNOSIS — R7989 Other specified abnormal findings of blood chemistry: Secondary | ICD-10-CM | POA: Insufficient documentation

## 2021-03-25 DIAGNOSIS — K2270 Barrett's esophagus without dysplasia: Secondary | ICD-10-CM | POA: Diagnosis not present

## 2021-03-25 DIAGNOSIS — R945 Abnormal results of liver function studies: Secondary | ICD-10-CM | POA: Diagnosis not present

## 2021-03-25 DIAGNOSIS — K529 Noninfective gastroenteritis and colitis, unspecified: Secondary | ICD-10-CM | POA: Insufficient documentation

## 2021-03-25 DIAGNOSIS — M7731 Calcaneal spur, right foot: Secondary | ICD-10-CM | POA: Diagnosis not present

## 2021-03-25 DIAGNOSIS — Z9049 Acquired absence of other specified parts of digestive tract: Secondary | ICD-10-CM | POA: Diagnosis not present

## 2021-03-25 DIAGNOSIS — K219 Gastro-esophageal reflux disease without esophagitis: Secondary | ICD-10-CM | POA: Insufficient documentation

## 2021-03-25 DIAGNOSIS — K760 Fatty (change of) liver, not elsewhere classified: Secondary | ICD-10-CM | POA: Diagnosis not present

## 2021-03-25 MED ORDER — PREDNISONE 20 MG PO TABS
40.0000 mg | ORAL_TABLET | Freq: Every day | ORAL | 0 refills | Status: AC
  Filled 2021-03-25: qty 10, 5d supply, fill #0

## 2021-03-25 NOTE — ED Triage Notes (Signed)
Pt here for right foot pain x 4 days; denies obvious injury but pain is radiating up leg and into toes

## 2021-03-25 NOTE — ED Provider Notes (Signed)
[facility]    CSN: [id number] Arrival date & time: 03/25/21  1004      History   Chief Complaint Chief Complaint  Patient presents with   Foot Pain    HPI Amy Hernandez is a 56 y.o. female.   Patient here today for evaluation of right foot pain that started about 4 days ago when she was dancing at church. She reports that she did step on something about a month ago but has not had continued pain from this. She denies any other known injury. She denies any numbness or tingling. Weight bearing worsens pain.   The history is provided by the patient.  Foot Pain Pertinent negatives include no shortness of breath.   Past Medical History:  Diagnosis Date   Barrett's esophagus - short segment 09/04/2016   Bartholin gland cyst    Breast cancer ([place])    Cervical intraepithelial neoplasia (CIN) 2005   high grade cervical dysplasia prior to total hysterectomy - needs yearly vaginal paps    GERD (gastroesophageal reflux disease)    Granular cell tumor - distal esophagus - smal 09/04/2016   HIV (human immunodeficiency virus infection) ([place])    Hyperlipidemia    Hypertension    IBS (irritable bowel syndrome)    Insomnia    Substance abuse ([place])     Patient Active Problem List   Diagnosis Date Noted   Hypokalemia 11/12/2020   Chronic insomnia 06/25/2020   Morbid obesity ([place]) 06/25/2020   Habitual snoring 06/25/2020   Excessive postexertional fatigue 06/25/2020   Abdominal distension (gaseous) 09/21/2019   GERD (gastroesophageal reflux disease) 10/11/2018   Smoker 10/11/2018   Nausea and vomiting 10/11/2018   Obesity, Class II, BMI 35-39.9 10/11/2018   Back muscle spasm 11/10/2017   Need for immunization against influenza 11/10/2017   Depression 12/12/2016   Elevated alkaline phosphatase level 12/12/2016   Granular cell tumor - distal esophagus - smal 09/04/2016   Barrett's esophagus - short segment 09/04/2016   IBS (irritable bowel syndrome) 08/28/2014    Insomnia 08/28/2014   HLD (hyperlipidemia) 02/07/2009   Human immunodeficiency virus (HIV) disease ([place]) 12/05/2005   HTN (hypertension) 12/05/2005    Past Surgical History:  Procedure Laterality Date   ABDOMINAL HYSTERECTOMY  09/2003   ANAL SPHINCTEROTOMY     CHOLECYSTECTOMY  1990   COLONOSCOPY  2009   NL   ESOPHAGOGASTRODUODENOSCOPY  2018   LEEP  2000   history of CIN    TUBAL LIGATION      OB History     Gravida  3   Para  2   Term  2   Preterm      AB  1   Living  2      SAB      IAB  1   Ectopic      Multiple      Live Births  2            Home Medications    Prior to Admission medications   Medication Sig Start Date End Date Taking? Authorizing Provider  predniSONE (DELTASONE) 20 MG tablet Take 2 tablets (40 mg total) by mouth daily with breakfast for 5 days. 03/25/21 03/30/21 Yes Francene Finders, PA-C  albuterol (VENTOLIN HFA) 108 (90 Base) MCG/ACT inhaler Inhale 1 puff into the lungs every 6 (six) hours as needed for wheezing or shortness of breath. 05/07/20   [place] Callas, NP  bictegravir-emtricitabine-tenofovir AF (BIKTARVY) 50-200-25 MG TABS tablet TAKE  1 TABLET BY MOUTH DAILY. TRY TO TAKE AT THE SAME TIME EACH DAY WITH OR WITHOUT FOOD. 11/12/20 11/12/21  [place] Callas, NP  buPROPion (WELLBUTRIN XL) 150 MG 24 hr tablet Take 1 tablet (150 mg total) by mouth daily. 12/03/20   Dohmeier, Asencion Partridge, MD  colesevelam [facility]) 625 MG tablet Take 1 tablet by mouth 3 times daily. 03/20/21   Mauri Pole, MD  fluticasone (FLONASE) 50 MCG/ACT nasal spray Place 1-2 sprays into both nostrils daily. 05/20/20   Wieters, Hallie C, PA-C  lisinopril-hydrochlorothiazide (ZESTORETIC) 20-25 MG tablet TAKE 1 TABLET BY MOUTH DAILY. 11/12/20   [place] Callas, NP  omeprazole (PRILOSEC) 40 MG capsule TAKE 1 CAPSULE BY MOUTH ONCE A DAY 10/03/20 10/03/21  Gatha Mayer, MD  potassium chloride SA (KLOR-CON M) 20 MEQ tablet TAKE 1 TABLET BY MOUTH ONCE DAILY  11/12/20 11/12/21  [place] Callas, NP  simvastatin (ZOCOR) 20 MG tablet TAKE 1 TABLET BY MOUTH ONCE A DAY 12/30/20 12/30/21  [place] Callas, NP  zolpidem (AMBIEN CR) 12.5 MG CR tablet Take 1 tablet by mouth at bedtime as needed for sleep. 03/04/21   [place] Callas, NP    Family History Family History  Problem Relation Age of Onset   Diabetes Mother    Hyperlipidemia Mother    Hypertension Mother    Cancer Father        bladder cancer    Heart disease Father    Hypertension Father    Breast cancer Maternal Grandmother    Diabetes Maternal Grandmother    Hyperlipidemia Maternal Grandmother    Colon cancer Neg Hx    Esophageal cancer Neg Hx    Rectal cancer Neg Hx    Stomach cancer Neg Hx     Social History Social History   Tobacco Use   Smoking status: Every Day    Packs/day: 0.30    Years: 30.00    Pack years: 9.00    Types: Cigarettes   Smokeless tobacco: Never   Tobacco comments:    Pt states she is working on quitting  Scientific laboratory technician Use: Never used  Substance Use Topics   Alcohol use: No    Alcohol/week: 0.0 standard drinks   Drug use: No     Allergies   Codeine   Review of Systems Review of Systems  Constitutional:  Negative for chills and fever.  Eyes:  Negative for discharge and redness.  Respiratory:  Negative for shortness of breath.   Musculoskeletal:  Positive for arthralgias.  Skin:  Negative for color change.  Neurological:  Negative for numbness.    Physical Exam Triage Vital Signs ED Triage Vitals  Enc Vitals Group     BP 03/25/21 1132 116/77     Pulse Rate 03/25/21 1132 86     Resp 03/25/21 1132 18     Temp 03/25/21 1132 97.9 F (36.6 C)     Temp Source 03/25/21 1132 Oral     SpO2 03/25/21 1132 97 %     Weight --      Height --      Head Circumference --      Peak Flow --      Pain Score 03/25/21 1134 6     Pain Loc --      Pain Edu? --      Excl. in [place]? --    No data found.  Updated Vital Signs BP 116/77  (BP Location: Left Arm)  Pulse 86    Temp 97.9 F (36.6 C) (Oral)    Resp 18    SpO2 97%      Physical Exam Vitals and nursing note reviewed.  Constitutional:      General: She is not in acute distress.    Appearance: Normal appearance. She is not ill-appearing.  HENT:     Head: Normocephalic and atraumatic.  Eyes:     Conjunctiva/sclera: Conjunctivae normal.  Cardiovascular:     Rate and Rhythm: Normal rate.  Pulmonary:     Effort: Pulmonary effort is normal.  Musculoskeletal:     Comments: decreased ROM of right ankle due to pain in foot. NoTTP noted to mid arch of right foot.   Neurological:     Mental Status: She is alert.     Comments: Gross sensation intact to right toes.   Psychiatric:        Mood and Affect: Mood normal.        Behavior: Behavior normal.        Thought Content: Thought content normal.     UC Treatments / Results  Labs (all labs ordered are listed, but only abnormal results are displayed) Labs Reviewed - No data to display  EKG   Radiology DG Foot Complete Right  Result Date: 03/25/2021 CLINICAL DATA:  Pain EXAM: RIGHT FOOT COMPLETE - 3+ VIEW COMPARISON:  None. FINDINGS: No fracture or dislocation is seen. Osteopenia is seen in bony structures. No focal lytic lesions are seen. Plantar spur is seen in calcaneus. There is coarse calcification at the attachment of Achilles tendon to the calcaneus suggesting calcific tendinosis. IMPRESSION: No fracture or dislocation is seen in the right foot. Electronically Signed   By: Elmer Picker M.D.   On: 03/25/2021 12:04   US Abdomen Limited RUQ (LIVER/GB)  Result Date: 03/25/2021 CLINICAL DATA:  Abnormal liver function tests EXAM: ULTRASOUND ABDOMEN LIMITED RIGHT UPPER QUADRANT COMPARISON:  07/30/2016 FINDINGS: Gallbladder: Not seen consistent with cholecystectomy. Common bile duct: Diameter: 5.3 mm Liver: There is increased echogenicity in the liver. No focal abnormality is seen. Portal vein is patent  on color Doppler imaging with normal direction of blood flow towards the liver. Other: No significant interval changes noted. IMPRESSION: Fatty liver.  Status post cholecystectomy. Electronically Signed   By: Elmer Picker M.D.   On: 03/25/2021 12:06    Procedures Procedures (including critical care time)  Medications Ordered in UC Medications - No data to display  Initial Impression / Assessment and Plan / UC Course  I have reviewed the triage vital signs and the nursing notes.  Pertinent labs & imaging results that were available during my care of the patient were reviewed by me and considered in my medical decision making (see chart for details).    Xray without fracture. Will treat with steroid burst and recommended follow up if symptoms do not improve or worsen.    Final Clinical Impressions(s) / UC Diagnoses   Final diagnoses:  Right foot pain   Discharge Instructions   None    ED Prescriptions     Medication Sig Dispense Auth. Provider   predniSONE (DELTASONE) 20 MG tablet Take 2 tablets (40 mg total) by mouth daily with breakfast for 5 days. 10 tablet Francene Finders, PA-C      PDMP not reviewed this encounter.   Francene Finders, PA-C 03/25/21 1228

## 2021-03-29 DIAGNOSIS — G4733 Obstructive sleep apnea (adult) (pediatric): Secondary | ICD-10-CM | POA: Diagnosis not present

## 2021-04-02 ENCOUNTER — Other Ambulatory Visit (HOSPITAL_COMMUNITY): Payer: Self-pay

## 2021-04-03 ENCOUNTER — Encounter: Payer: Self-pay | Admitting: Gastroenterology

## 2021-04-03 ENCOUNTER — Other Ambulatory Visit (HOSPITAL_COMMUNITY): Payer: Self-pay

## 2021-04-07 ENCOUNTER — Other Ambulatory Visit (HOSPITAL_COMMUNITY): Payer: Self-pay

## 2021-04-08 ENCOUNTER — Other Ambulatory Visit (HOSPITAL_COMMUNITY): Payer: Self-pay

## 2021-04-14 ENCOUNTER — Encounter: Payer: BC Managed Care – PPO | Admitting: Adult Health

## 2021-04-14 ENCOUNTER — Encounter: Payer: Self-pay | Admitting: Neurology

## 2021-04-15 ENCOUNTER — Encounter: Payer: Self-pay | Admitting: Neurology

## 2021-04-15 ENCOUNTER — Other Ambulatory Visit: Payer: Self-pay

## 2021-04-15 ENCOUNTER — Ambulatory Visit (INDEPENDENT_AMBULATORY_CARE_PROVIDER_SITE_OTHER): Payer: BC Managed Care – PPO | Admitting: Neurology

## 2021-04-15 VITALS — BP 128/83 | HR 88 | Ht 62.0 in | Wt 191.0 lb

## 2021-04-15 DIAGNOSIS — G4733 Obstructive sleep apnea (adult) (pediatric): Secondary | ICD-10-CM

## 2021-04-15 DIAGNOSIS — Z789 Other specified health status: Secondary | ICD-10-CM | POA: Diagnosis not present

## 2021-04-15 DIAGNOSIS — Z9989 Dependence on other enabling machines and devices: Secondary | ICD-10-CM

## 2021-04-15 NOTE — Patient Instructions (Signed)

## 2021-04-15 NOTE — Progress Notes (Signed)
? ? ?SLEEP MEDICINE CLINIC ?  ? ?Provider:  Larey Seat, MD  ?Primary Care Physician:  Amy Headings, MD ?[address] ?[place] [address]  ? ?  ?Referring Provider: McEwen Callas, NP- ?[facility]  ?[address] ?[place],  [place] [address]  ?  ?  ?    ?Chief Complaint according to patient   ?Patient presents with:  ?  ? New Patient (Initial Visit)  ?   Pt states that she having difficulty with humidity level being too much (I adjusted to 3 it was on Auto and showed pt how to adjust) along with mask causing problems . ?  ?  ?  ?HISTORY OF PRESENT ILLNESS:  ?Amy Hernandez is a 56 y.o. African American female patient , who is established with Amy Hernandez for general Neurology and presents today for a INSOMNIA evaluation -upon  referral  by Amy Madeira, NP . ?She is still highly  prone to insomnia. Her PCP  has for now placed her back on [place].  ? ? ?RV on 04-15-2021:  ?[name] underwent a home sleep test and had endorsed a very low degree of sleepiness with a high degree of fatigue.  The apnea hypopnea index for this home sleep test was still very severe at 36.4/h during REM sleep this exacerbated to 58.4/h in non-REM sleep her AHI was 27.8.  There was no supine sleep recorded which is usually another trigger factor for higher apnea indices.  She had only 7 minutes of low oxygen saturation so I would not consider this a significant degree of hypoxia and her heart rate varied in normal range between 55 and 108 bpm.  Given the degree of apnea I still wanted her to try CPAP.  I wrote an auto titration order for a CPAP device between 6 and 20 cmH2O with 3 cm EPR heated humidification and a mask of her choice.   ?I stated that treatment of apnea may not positively influence her insomnia.  ? I had asked her referring physician to consider cognitive behavioral therapy.  ? ? Ms. Carras has used the machine 24 out of 29 days the equal of 83%.  But she is hardly able to use the machine  for 4 hours most days her sleep is only recorded at 3 hours.  She does present with a higher air leak at 35.8 L/min but her residual AHI is very low at 0.4/h. ?She reports issues with condensation water-  my CMA changed the level to a lower degree. ?She is still not compliant. She has not had any supplies from [employer], based on low compliance ? She did not bring her mask and headgear.  ? ? ?RV on 11-2020 ?After our initial consultation on 06-25-2020 I had ordered a home sleep test due to the patient's extreme high fatigue score , in contrast to this very low daytime sleepiness score -her Epworth sleepiness score was 1 out of 24 and the fatigue score was 58 out of 63 points so we assume that the patient may have an underlying autoimmune or metabolic disorder but I found that the patient's apnea index was actually significant so the overall AHI was 36.4/h in rem sleep 58.4/h and there was no supine sleep recorded.  The patient slept prone and on her left side she had only 7 minutes of hypoxia for the whole duration and normal heart rates.  So an auto titration CPAP device with a  setting between 6 and 20 cmH2O with 3 cm EPR, heated humidification and a mask of choice and comfort for the patient will be ordered the treatment of apnea may not influence the insomnia which is of more psychological issues.  So I have strongly recommended her primary care physician to refer for cognitive behavioral therapy. ? ?I will refill Ambien for her one month- hopefully its not too long  until her primary care can get her to cognitive behavior therapy.  ? ? ? ? ? ?  ?Chief concern according to patient :  " I was Ok on Ambien since my hysterectomy, and very active, until Amy. Carlean Purl felt I need a lower dose of ambiene, and then my insomnia came back- , from 12.5 to 6.5 mg. I have Barrett's esophagus,  ?I am soo fatigued now, but I just can't sleep"  " I failed Trazodone, Tylenol PM,  and melatonin - only then was re-prescribed Ambien" . I  had acute vision loss with Migraine and have seen the ED and Amy. Tomi Likens, DO". ? ? ? Amy Hernandez  has a past medical history of Barrett's esophagus - short segment (09/04/2016), Bartholin gland cyst, Breast cancer ([place]), Cervical intraepithelial neoplasia (CIN) (2005), GERD (gastroesophageal reflux disease), Granular cell tumor - distal esophagus - smal (09/04/2016), HIV (human immunodeficiency virus infection) ([place]), Hyperlipidemia, Hypertension, IBS (irritable bowel syndrome), Insomnia, and Substance abuse ([place]). ?  ?The patient never had a sleep study.  ?  ?Sleep relevant medical history: No Nocturia, no Sleep walking, no ENT surgery/ Tonsillectomy. HIV treatment of retroviral.  ? Family medical /sleep history:  other family member on CPAP with OSA, insomnia, sleep walkers.  ?  ?Social history:  Patient is working as Warden/ranger person. ?She  lives in a household with daughter and her grandson.  ?The patient currently works daytime form home - Pets are present, a cat and a dog. ?Tobacco use; 10 cig or less a day-.  ETOH use ; none ,  ?Caffeine intake in form of Coffee( /) Soda( 2 pepsi a day) . ?Regular exercise in form of step training.   ? ?Sleep habits are as follows: ?The patient's dinner time is between 5-6 PM. The patient goes to bed at  9-10PM and continues to sleep for 7-8 hours while taking Ambien.  Now spending hours in bed before sleep may come.  RACING THOUGHTS. Tried meditation tapes. Never has seen psychologist.   ?The preferred sleep position is sideways or prone , with the support of 1-2 pillows.  ?Dreams are reportedly frequent/vivid.  ?6-7  AM is the usual rise time. The patient wakes up with an alarm, hitting the snooze button- . Averaging 4 hours of sleep on low dose ambien.  ?She reports not feeling refreshed or restored in AM, with symptoms such as dry mouth ,and residual fatigue. ? Naps are taken infrequently,  She can't initiate sleep. ? ?  ?Review of Systems: ?Out of a complete 14  system review, the patient complains of only the following symptoms, and all other reviewed systems are negative.:  ?Fatigue,fragmented sleep,chronic  Insomnia. ?  ?How likely are you to doze in the following situations: ?0 = not likely, 1 = slight chance, 2 = moderate chance, 3 = high chance ?  ?Sitting and Reading? ?Watching Television? ?Sitting inactive in a public place (theater or meeting)? ?As a passenger in a car for an hour without a break? ?Lying down in the afternoon when circumstances permit? ?Sitting and talking to  someone? ?Sitting quietly after lunch without alcohol? ?In a car, while stopped for a few minutes in traffic? ?  ?Total = ONE / 24 points  ? FSS endorsed at  58/ 63 points. VERY HIGH DEGREE OF FATIGUE>  ? ?Social History  ? ?Socioeconomic History  ? Marital status: Legally Separated  ?  Spouse name: Not on file  ? Number of children: 2  ? Years of education: Not on file  ? Highest education level: Not on file  ?Occupational History  ? Occupation: Therapist, art rep  ?  Employer: [employer]  ?Tobacco Use  ? Smoking status: Every Day  ?  Packs/day: 0.30  ?  Years: 30.00  ?  Pack years: 9.00  ?  Types: Cigarettes  ? Smokeless tobacco: Never  ? Tobacco comments:  ?  Pt states she is working on quitting  ?Vaping Use  ? Vaping Use: Never used  ?Substance and Sexual Activity  ? Alcohol use: No  ?  Alcohol/week: 0.0 standard drinks  ? Drug use: No  ? Sexual activity: Not Currently  ?  Partners: Male  ?  Comment:  declined condoms  ?Other Topics Concern  ? Not on file  ?Social History Narrative  ? Married but legally separated, 2 daughters grandchild at home  ? 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 No  alcohol or drug use  ? She is a smoker  ? ?Social Determinants of Health  ? ?Financial Resource Strain: Not on file  ?Food Insecurity: Not on file  ?Transportation Needs: Not on file  ?Physical Activity: Not on file  ?Stress: Not on file  ?Social Connections: Not on file  ? ? ?Family History   ?Problem Relation Age of Onset  ? Diabetes Mother   ? Hyperlipidemia Mother   ? Hypertension Mother   ? Cancer Father   ?     bladder cancer   ? Heart disease Father   ? Hypertension Father   ? Breast cance

## 2021-04-16 NOTE — Progress Notes (Signed)
CM sent to [facility] for new order ?

## 2021-04-17 ENCOUNTER — Other Ambulatory Visit (HOSPITAL_COMMUNITY): Payer: Self-pay

## 2021-04-28 ENCOUNTER — Other Ambulatory Visit (HOSPITAL_COMMUNITY): Payer: Self-pay

## 2021-04-28 ENCOUNTER — Other Ambulatory Visit: Payer: Self-pay | Admitting: Infectious Diseases

## 2021-04-28 DIAGNOSIS — E785 Hyperlipidemia, unspecified: Secondary | ICD-10-CM

## 2021-04-28 NOTE — Telephone Encounter (Signed)
Please advise if okay to refill. 

## 2021-04-29 DIAGNOSIS — G4733 Obstructive sleep apnea (adult) (pediatric): Secondary | ICD-10-CM | POA: Diagnosis not present

## 2021-04-29 MED ORDER — SIMVASTATIN 20 MG PO TABS
ORAL_TABLET | Freq: Every day | ORAL | 11 refills | Status: DC
  Filled 2021-05-08: qty 30, 30d supply, fill #0
  Filled 2021-05-27: qty 30, 30d supply, fill #1
  Filled 2021-06-25: qty 30, 30d supply, fill #2
  Filled 2021-07-23: qty 30, 30d supply, fill #3
  Filled 2021-09-01: qty 30, 30d supply, fill #4
  Filled 2021-09-26: qty 30, 30d supply, fill #5
  Filled 2021-10-23: qty 30, 30d supply, fill #6

## 2021-04-29 NOTE — Telephone Encounter (Signed)
Refills sent in for 1 year ?

## 2021-05-01 ENCOUNTER — Other Ambulatory Visit (HOSPITAL_COMMUNITY): Payer: Self-pay

## 2021-05-08 ENCOUNTER — Other Ambulatory Visit (HOSPITAL_COMMUNITY): Payer: Self-pay

## 2021-05-16 ENCOUNTER — Encounter: Payer: Self-pay | Admitting: Gastroenterology

## 2021-05-16 ENCOUNTER — Ambulatory Visit (INDEPENDENT_AMBULATORY_CARE_PROVIDER_SITE_OTHER): Payer: BC Managed Care – PPO | Admitting: Gastroenterology

## 2021-05-16 ENCOUNTER — Other Ambulatory Visit (HOSPITAL_COMMUNITY): Payer: Self-pay

## 2021-05-16 VITALS — BP 92/60 | HR 86 | Ht 62.0 in | Wt 192.4 lb

## 2021-05-16 DIAGNOSIS — K529 Noninfective gastroenteritis and colitis, unspecified: Secondary | ICD-10-CM | POA: Diagnosis not present

## 2021-05-16 DIAGNOSIS — K9089 Other intestinal malabsorption: Secondary | ICD-10-CM

## 2021-05-16 DIAGNOSIS — K219 Gastro-esophageal reflux disease without esophagitis: Secondary | ICD-10-CM | POA: Diagnosis not present

## 2021-05-16 MED ORDER — COLESEVELAM HCL 625 MG PO TABS
625.0000 mg | ORAL_TABLET | Freq: Three times a day (TID) | ORAL | 11 refills | Status: DC
  Filled 2021-05-16: qty 90, 30d supply, fill #0
  Filled 2021-07-23: qty 90, 30d supply, fill #1
  Filled 2021-09-01: qty 90, 30d supply, fill #2
  Filled 2021-09-26: qty 90, 30d supply, fill #3
  Filled 2021-10-23: qty 90, 30d supply, fill #4
  Filled 2021-11-25: qty 90, 30d supply, fill #5
  Filled 2021-12-24: qty 90, 30d supply, fill #6
  Filled 2022-04-29: qty 90, 30d supply, fill #7

## 2021-05-16 NOTE — Progress Notes (Signed)
? ?       ? ?Amy Hernandez    [id number]    08-01-1965 ? ?Primary Care Physician:Pcp, No ? ?Referring Physician: Thayer Headings, MD ?[address] ?[address] ?[place],  [place] [address] ? ? ?Chief complaint: Chronic diarrhea ? ?HPI: ? ?56 year old very pleasant female here for follow-up visit chronic diarrhea. ? ?Overall she is doing well, diarrhea has improved with WelChol 3 times daily with meals.  She is avoiding to take it within 3 to 4 hours of other medications to prevent drug interaction ? ?On average she is having 1-3 bowel movements per day.  Denies any mucus or rectal bleeding ? ?LFTs have trended down to baseline.  Negative for any underlying chronic liver disease ?She has mild elevation in CRP ?She is s/p cholecystectomy, history of HIV with undetectable viral load on treatment ?  ?Relevant GI work up: ?Right upper quadrant ultrasound March 25, 2021: Fatty liver.  Status post cholecystectomy. ?  ?EGD September 29, 2019 by Dr. Carlean Purl ?- Tortuous esophagus. ?- Benign-appearing esophageal stenosis. Dilated. ?- LA Grade A reflux esophagitis with no bleeding. Biopsied. ?- 5 cm hiatal hernia. ?- Normal stomach. ?- Normal examined duodenum. Biopsied. ?1. Surgical [P], duodenal bxs ?- DUODENAL MUCOSA WITH FOCAL, MILD CHANGES OF PEPTIC INJURY. ?- NO FEATURES OF CELIAC SPRUE OR GRANULOMAS. ?2. Surgical [P], distal esophagus ?- INTESTINAL METAPLASIA CONSISTENT WITH BARRETT'S ESOPHAGUS WITH FOCAL GLANDULAR ATYPIA. ?- SEE MICROSCOPIC DESCRIPTION ?  ?EGD August 17, 2018 ?- Nodule found in the esophagus. Biopsied. ?- Esophageal mucosal changes suggestive of short-segment Barrett's esophagus. Biopsied. ?- Small hiatal hernia. ?- Gastritis. Biopsied. ?- Mucosal changes in the duodenum. Biopsied. ?- The examination was otherwise normal. ?  ?Colonoscopy August 17, 2018 ?- Diverticulosis in the left colon. ?- The examination was otherwise normal on direct and retroflexion views. ?- No specimens collected. ?  ?1. Surgical  [P], distal esophagus ?- SQUAMOCOLUMNAR JUNCTION WITH ULCERATION, ACUTE AND CHRONIC INFLAMMATION ?- NO INTESTINAL METAPLASIA, DYSPLASIA OR MALIGNANCY IDENTIFIED ?2. Surgical [P], distal esophagus ?- REACTIVE SQUAMOUS MUCOSA WITH INCREASED INTRAEPITHELIAL LYMPHOCYTES ?- NO MALIGNANCY IDENTIFIED ?  ?  ?CT abd & pelvis 09/25/19 ?No acute findings within the abdomen or pelvis. ?Stable small epigastric ventral hernia containing only omental fat. ? ? ?Outpatient Encounter Medications as of 05/16/2021  ?Medication Sig  ? albuterol (VENTOLIN HFA) 108 (90 Base) MCG/ACT inhaler Inhale 1 puff into the lungs every 6 (six) hours as needed for wheezing or shortness of breath.  ? bictegravir-emtricitabine-tenofovir AF (BIKTARVY) 50-200-25 MG TABS tablet TAKE 1 TABLET BY MOUTH DAILY. TRY TO TAKE AT THE SAME TIME EACH DAY WITH OR WITHOUT FOOD.  ? buPROPion (WELLBUTRIN XL) 150 MG 24 hr tablet Take 1 tablet (150 mg total) by mouth daily.  ? colesevelam (WELCHOL) 625 MG tablet Take 1 tablet by mouth 3 times daily.  ? fluticasone (FLONASE) 50 MCG/ACT nasal spray Place 1-2 sprays into both nostrils daily.  ? lisinopril-hydrochlorothiazide (ZESTORETIC) 20-25 MG tablet TAKE 1 TABLET BY MOUTH DAILY.  ? omeprazole (PRILOSEC) 40 MG capsule TAKE 1 CAPSULE BY MOUTH ONCE A DAY  ? potassium chloride SA (KLOR-CON M) 20 MEQ tablet TAKE 1 TABLET BY MOUTH ONCE DAILY  ? simvastatin (ZOCOR) 20 MG tablet TAKE 1 TABLET BY MOUTH ONCE A DAY  ? zolpidem (AMBIEN CR) 12.5 MG CR tablet Take 1 tablet by mouth at bedtime as needed for sleep.  ? ?No facility-administered encounter medications on file as of 05/16/2021.  ? ? ?Allergies as of  05/16/2021 - Review Complete 05/16/2021  ?Allergen Reaction Noted  ? Codeine    ? ? ?Past Medical History:  ?Diagnosis Date  ? Barrett's esophagus - short segment 09/04/2016  ? Bartholin gland cyst   ? Breast cancer ([place])   ? Cervical intraepithelial neoplasia (CIN) 2005  ? high grade cervical dysplasia prior to total hysterectomy  - needs yearly vaginal paps   ? GERD (gastroesophageal reflux disease)   ? Granular cell tumor - distal esophagus - smal 09/04/2016  ? HIV (human immunodeficiency virus infection) ([place])   ? Hyperlipidemia   ? Hypertension   ? IBS (irritable bowel syndrome)   ? Insomnia   ? Substance abuse ([place])   ? ? ?Past Surgical History:  ?Procedure Laterality Date  ? ABDOMINAL HYSTERECTOMY  09/2003  ? ANAL SPHINCTEROTOMY    ? CHOLECYSTECTOMY  1990  ? COLONOSCOPY  2009  ? NL  ? ESOPHAGOGASTRODUODENOSCOPY  2018  ? LEEP  2000  ? history of CIN   ? TUBAL LIGATION    ? ? ?Family History  ?Problem Relation Age of Onset  ? Diabetes Mother   ? Hyperlipidemia Mother   ? Hypertension Mother   ? Cancer Father   ?     bladder cancer   ? Heart disease Father   ? Hypertension Father   ? Breast cancer Maternal Grandmother   ? Diabetes Maternal Grandmother   ? Hyperlipidemia Maternal Grandmother   ? Colon cancer Neg Hx   ? Esophageal cancer Neg Hx   ? Rectal cancer Neg Hx   ? Stomach cancer Neg Hx   ? ? ?Social History  ? ?Socioeconomic History  ? Marital status: Legally Separated  ?  Spouse name: Not on file  ? Number of children: 2  ? Years of education: Not on file  ? Highest education level: Not on file  ?Occupational History  ? Occupation: Therapist, art rep  ?  Employer: [employer]  ?Tobacco Use  ? Smoking status: Every Day  ?  Packs/day: 0.30  ?  Years: 30.00  ?  Pack years: 9.00  ?  Types: Cigarettes  ? Smokeless tobacco: Never  ? Tobacco comments:  ?  Pt states she is working on quitting  ?Vaping Use  ? Vaping Use: Never used  ?Substance and Sexual Activity  ? Alcohol use: No  ?  Alcohol/week: 0.0 standard drinks  ? Drug use: No  ? Sexual activity: Not Currently  ?  Partners: Male  ?  Comment:  declined condoms  ?Other Topics Concern  ? Not on file  ?Social History Narrative  ? Married but legally separated, 2 daughters grandchild at home  ? 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 No  alcohol or drug use  ? She is a smoker   ? ?Social Determinants of Health  ? ?Financial Resource Strain: Not on file  ?Food Insecurity: Not on file  ?Transportation Needs: Not on file  ?Physical Activity: Not on file  ?Stress: Not on file  ?Social Connections: Not on file  ?Intimate Partner Violence: Not on file  ? ? ? ? ?Review of systems: ?All other review of systems negative except as mentioned in the HPI. ? ? ?Physical Exam: ?Vitals:  ? 05/16/21 0943  ?BP: 92/60  ?Pulse: 86  ? ?Body mass index is 35.19 kg/m?. ?Gen:      No acute distress ?HEENT:  sclera anicteric ?Neuro: alert and oriented x 3 ?Psych: normal mood and affect ? ?Data Reviewed: ? ?Reviewed labs,  radiology imaging, old records and pertinent past GI work up ? ? ?Assessment and Plan/Recommendations: ? ?56 year old very pleasant female with history of hypertension, HIV on treatment, chronic GERD with Barrett's esophagus here for follow-up visit for chronic diarrhea ?  ?Epigastric abdominal pain: Resolved  ?LFT have improved to baseline  ?Right upper quadrant abdominal ultrasound was unremarkable for any acute pathology  ?  ?Barrett's esophagus and chronic GERD: ?Due for recall surveillance EGD in September 2024 ?Continue omeprazole and antireflux measures ?  ? ?IBS chronic diarrhea -bile salt induced improved with current treatment regimen ?Continue WelChol 1 tablet 3 times daily  ? ?Continue with high-fiber diet and increase water intake ?  ?Return in 1 year ? ?The patient was provided an opportunity to ask questions and all were answered. The patient agreed with the plan and demonstrated an understanding of the instructions. ? ?K. Denzil Magnuson , MD ?  ? ?CC: Comer, Okey Regal, MD ? ? ?

## 2021-05-16 NOTE — Patient Instructions (Addendum)
Continue Welchol ? ?We have sent the following medications to your pharmacy for you to pick up at your convenience:  Welchol ? ?Follow up in 1 year ? ?If you are age 54 or older, your body mass index should be between 23-30. Your Body mass index is 35.19 kg/m?Marland Kitchen If this is out of the aforementioned range listed, please consider follow up with your Primary Care Provider. ? ?If you are age 25 or younger, your body mass index should be between 19-25. Your Body mass index is 35.19 kg/m?Marland Kitchen If this is out of the aformentioned range listed, please consider follow up with your Primary Care Provider.  ? ?________________________________________________________ ? ?The [facility] providers would like to encourage you to use [facility] to communicate with providers for non-urgent requests or questions.  Due to long hold times on the telephone, sending your provider a message by [facility] may be a faster and more efficient way to get a response.  Please allow 48 business hours for a response.  Please remember that this is for non-urgent requests.  ?_______________________________________________________  ? ?I appreciate the  opportunity to care for you ? ?Thank You  ? ?Harl Bowie , MD  ?

## 2021-05-27 ENCOUNTER — Other Ambulatory Visit: Payer: Self-pay | Admitting: Neurology

## 2021-05-27 ENCOUNTER — Other Ambulatory Visit (HOSPITAL_COMMUNITY): Payer: Self-pay

## 2021-05-27 MED ORDER — BUPROPION HCL ER (XL) 150 MG PO TB24
150.0000 mg | ORAL_TABLET | Freq: Every day | ORAL | 5 refills | Status: DC
  Filled 2021-06-03: qty 30, 30d supply, fill #0
  Filled 2021-06-25: qty 30, 30d supply, fill #1
  Filled 2021-07-23: qty 30, 30d supply, fill #2
  Filled 2021-09-01: qty 30, 30d supply, fill #3
  Filled 2021-09-26: qty 30, 30d supply, fill #4
  Filled 2021-10-23: qty 30, 30d supply, fill #5

## 2021-05-27 NOTE — Telephone Encounter (Signed)
Called and spoke with pt. Confirmed she is taking and would like refill sent. I e-scribed refill. ?

## 2021-05-29 ENCOUNTER — Other Ambulatory Visit (HOSPITAL_COMMUNITY): Payer: Self-pay

## 2021-05-29 DIAGNOSIS — G4733 Obstructive sleep apnea (adult) (pediatric): Secondary | ICD-10-CM | POA: Diagnosis not present

## 2021-06-03 ENCOUNTER — Other Ambulatory Visit (HOSPITAL_COMMUNITY): Payer: Self-pay

## 2021-06-13 ENCOUNTER — Other Ambulatory Visit: Payer: Self-pay | Admitting: Advanced Practice Midwife

## 2021-06-13 DIAGNOSIS — Z1231 Encounter for screening mammogram for malignant neoplasm of breast: Secondary | ICD-10-CM

## 2021-06-25 ENCOUNTER — Other Ambulatory Visit (HOSPITAL_COMMUNITY): Payer: Self-pay

## 2021-06-28 ENCOUNTER — Other Ambulatory Visit (HOSPITAL_COMMUNITY): Payer: Self-pay

## 2021-06-30 ENCOUNTER — Other Ambulatory Visit (HOSPITAL_COMMUNITY): Payer: Self-pay

## 2021-07-11 ENCOUNTER — Ambulatory Visit
Admission: RE | Admit: 2021-07-11 | Discharge: 2021-07-11 | Disposition: A | Discharge status: Home / Self Care | Payer: BC Managed Care – PPO | Source: Ambulatory Visit | Attending: Advanced Practice Midwife | Admitting: Advanced Practice Midwife

## 2021-07-11 DIAGNOSIS — Z1231 Encounter for screening mammogram for malignant neoplasm of breast: Secondary | ICD-10-CM

## 2021-07-15 ENCOUNTER — Other Ambulatory Visit: Payer: Self-pay | Admitting: Advanced Practice Midwife

## 2021-07-15 DIAGNOSIS — R928 Other abnormal and inconclusive findings on diagnostic imaging of breast: Secondary | ICD-10-CM

## 2021-07-21 ENCOUNTER — Other Ambulatory Visit (HOSPITAL_COMMUNITY): Payer: Self-pay

## 2021-07-23 ENCOUNTER — Other Ambulatory Visit (HOSPITAL_COMMUNITY): Payer: Self-pay

## 2021-07-28 ENCOUNTER — Other Ambulatory Visit (HOSPITAL_COMMUNITY): Payer: Self-pay

## 2021-07-31 ENCOUNTER — Encounter: Payer: Self-pay | Admitting: Internal Medicine

## 2021-07-31 ENCOUNTER — Ambulatory Visit (INDEPENDENT_AMBULATORY_CARE_PROVIDER_SITE_OTHER): Payer: BC Managed Care – PPO | Admitting: Internal Medicine

## 2021-07-31 VITALS — BP 122/74 | HR 83 | Resp 18 | Ht 62.0 in | Wt 194.6 lb

## 2021-07-31 DIAGNOSIS — E782 Mixed hyperlipidemia: Secondary | ICD-10-CM

## 2021-07-31 DIAGNOSIS — Z789 Other specified health status: Secondary | ICD-10-CM | POA: Diagnosis not present

## 2021-07-31 DIAGNOSIS — K219 Gastro-esophageal reflux disease without esophagitis: Secondary | ICD-10-CM | POA: Diagnosis not present

## 2021-07-31 DIAGNOSIS — F3341 Major depressive disorder, recurrent, in partial remission: Secondary | ICD-10-CM | POA: Diagnosis not present

## 2021-07-31 DIAGNOSIS — F17200 Nicotine dependence, unspecified, uncomplicated: Secondary | ICD-10-CM

## 2021-07-31 DIAGNOSIS — F5104 Psychophysiologic insomnia: Secondary | ICD-10-CM

## 2021-07-31 DIAGNOSIS — Z0000 Encounter for general adult medical examination without abnormal findings: Secondary | ICD-10-CM

## 2021-07-31 DIAGNOSIS — I10 Essential (primary) hypertension: Secondary | ICD-10-CM

## 2021-07-31 DIAGNOSIS — Z0001 Encounter for general adult medical examination with abnormal findings: Secondary | ICD-10-CM

## 2021-07-31 NOTE — Assessment & Plan Note (Signed)
Not using CPAP lately and counseled on need and benefit. She will try to resume.

## 2021-07-31 NOTE — Assessment & Plan Note (Signed)
Needs CBC and CMP with next lab draw in October will ask her ID specialist to get. BP at goal on lisinopril/hct 20/25 and will adjust as needed after labs.

## 2021-07-31 NOTE — Assessment & Plan Note (Signed)
Taking wellbutrin 150 mg daily and satisfied with control. Okay to prescribe if needed for her. Continue same dosing.

## 2021-07-31 NOTE — Assessment & Plan Note (Signed)
Counseled to quit today and she has cut back. Intends to try to quit but not today. Reminded about risk from smoking.

## 2021-07-31 NOTE — Assessment & Plan Note (Signed)
She is well controlled on ambien 12.5 mg qhs. With her past substance use we did discuss that this can be habit forming but she has not had problems and would like to continue. We agree we will prescribe when due for refill (just filled 07/28/21).

## 2021-07-31 NOTE — Assessment & Plan Note (Signed)
BMI 35 but complicated by hypertension, OSA, hyperlipidemia.

## 2021-07-31 NOTE — Assessment & Plan Note (Addendum)
No labs checked in several years but taking simvastatin 20 mg daily. She wishes to condense lab draws will place labs and ask her ID provider to draw when doing labs. Adjust as needed after labs.

## 2021-07-31 NOTE — Assessment & Plan Note (Signed)
Flu shot yearly. Covid-19 counseled. Shingrix counseled. Tetanus due declines today. Colonoscopy up to date. Mammogram up to date, pap smear up to date. Counseled about sun safety and mole surveillance. Counseled about the dangers of distracted driving. Given 10 year screening recommendations.

## 2021-07-31 NOTE — Assessment & Plan Note (Signed)
Taking omeprazole 40 mg daily and well controlled. Will continue and can prescribe if needed.

## 2021-07-31 NOTE — Progress Notes (Signed)
   Subjective:   Patient ID: Amy Hernandez, female    DOB: 1965-07-01, 56 y.o.   MRN: [id number]  HPI The patient is a new 56 YO female coming in for ongoing care and wants physical.   PMH, [place], social history reviewed and updated  Review of Systems  Constitutional: Negative.   HENT: Negative.    Eyes: Negative.   Respiratory:  Negative for cough, chest tightness and shortness of breath.   Cardiovascular:  Negative for chest pain, palpitations and leg swelling.  Gastrointestinal:  Positive for abdominal distention, abdominal pain and diarrhea. Negative for constipation, nausea and vomiting.  Musculoskeletal: Negative.   Skin: Negative.   Neurological: Negative.   Psychiatric/Behavioral: Negative.      Objective:  Physical Exam Constitutional:      Appearance: She is well-developed.  HENT:     Head: Normocephalic and atraumatic.  Cardiovascular:     Rate and Rhythm: Normal rate and regular rhythm.  Pulmonary:     Effort: Pulmonary effort is normal. No respiratory distress.     Breath sounds: Normal breath sounds. No wheezing or rales.  Abdominal:     General: Bowel sounds are normal. There is no distension.     Palpations: Abdomen is soft.     Tenderness: There is no abdominal tenderness. There is no rebound.  Musculoskeletal:     Cervical back: Normal range of motion.  Skin:    General: Skin is warm and dry.  Neurological:     Mental Status: She is alert and oriented to person, place, and time.     Coordination: Coordination normal.     Vitals:   07/31/21 0931  BP: 122/74  Pulse: 83  Resp: 18  SpO2: 94%  Weight: 194 lb 9.6 oz (88.3 kg)  Height: '5\' 2"'$  (1.575 m)    Assessment & Plan:

## 2021-08-08 ENCOUNTER — Other Ambulatory Visit (HOSPITAL_COMMUNITY): Payer: Self-pay

## 2021-08-11 ENCOUNTER — Inpatient Hospital Stay: Admission: RE | Admit: 2021-08-11 | Payer: BC Managed Care – PPO | Source: Ambulatory Visit

## 2021-08-11 ENCOUNTER — Other Ambulatory Visit: Payer: BC Managed Care – PPO

## 2021-08-28 ENCOUNTER — Other Ambulatory Visit (HOSPITAL_COMMUNITY): Payer: Self-pay

## 2021-08-28 ENCOUNTER — Encounter: Payer: Self-pay | Admitting: Emergency Medicine

## 2021-08-28 ENCOUNTER — Other Ambulatory Visit: Payer: Self-pay | Admitting: Internal Medicine

## 2021-08-28 ENCOUNTER — Telehealth (INDEPENDENT_AMBULATORY_CARE_PROVIDER_SITE_OTHER): Payer: BC Managed Care – PPO | Admitting: Emergency Medicine

## 2021-08-28 DIAGNOSIS — R112 Nausea with vomiting, unspecified: Secondary | ICD-10-CM | POA: Diagnosis not present

## 2021-08-28 DIAGNOSIS — U071 COVID-19: Secondary | ICD-10-CM

## 2021-08-28 MED ORDER — ONDANSETRON 4 MG PO TBDP
4.0000 mg | ORAL_TABLET | Freq: Three times a day (TID) | ORAL | 0 refills | Status: DC | PRN
  Filled 2021-08-28: qty 9, 21d supply, fill #0

## 2021-08-28 MED ORDER — NIRMATRELVIR/RITONAVIR (PAXLOVID) TABLET (RENAL DOSING)
2.0000 | ORAL_TABLET | Freq: Two times a day (BID) | ORAL | 0 refills | Status: AC
  Filled 2021-08-28: qty 20, 5d supply, fill #0

## 2021-08-28 NOTE — Progress Notes (Signed)
Telemedicine Encounter- SOAP NOTE Established Patient MyChart video conference Patient: Home  Provider: Office   Patient present only  This video encounter was conducted with the patient's (or proxy's) verbal consent via audio telecommunications: yes/no: Yes Patient was instructed to have this encounter in a suitably private space; and to only have persons present to whom they give permission to participate. In addition, patient identity was confirmed by use of name plus two identifiers (DOB and address).  I discussed the limitations, risks, security and privacy concerns of performing an evaluation and management service by telephone and the availability of in person appointments. I also discussed with the patient that there may be a patient responsible charge related to this service. The patient expressed understanding and agreed to proceed.  I spent a total of TIME; 0 MIN TO 60 MIN: 30 minutes talking with the patient or their proxy.  Chief complaint: Flulike symptoms/COVID positive  Subjective   Amy Hernandez is a 56 y.o. female established patient. Telephone visit today complaining of flulike symptoms that started last Monday, 3 days ago.  Tested positive for COVID yesterday.  Today feels worse than a couple days ago.  Has nausea and vomiting.  Denies significant difficulty breathing.  Positive cough.  No chest pain or palpitations.  Some diarrhea but no abdominal pain.  Occasional headaches. No other associated symptoms. No other complaints or medical concerns today. Past medical history: HIV positive on medication.  HPI   Patient Active Problem List   Diagnosis Date Noted   Difficulty with CPAP full face mask use 04/15/2021   OSA on CPAP 04/15/2021   Hypokalemia 11/12/2020   Chronic insomnia 06/25/2020   Morbid obesity ([place]) 06/25/2020   Habitual snoring 06/25/2020   Abdominal distension (gaseous) 09/21/2019   GERD (gastroesophageal reflux disease) 10/11/2018   Smoker  10/11/2018   Back muscle spasm 11/10/2017   Depression 12/12/2016   Elevated alkaline phosphatase level 12/12/2016   Granular cell tumor - distal esophagus - smal 09/04/2016   Barrett's esophagus - short segment 09/04/2016   IBS (irritable bowel syndrome) 08/28/2014   Encounter for general adult medical examination with abnormal findings 05/16/2013   HLD (hyperlipidemia) 02/07/2009   Human immunodeficiency virus (HIV) disease ([place]) 12/05/2005   HTN (hypertension) 12/05/2005    Past Medical History:  Diagnosis Date   Barrett's esophagus - short segment 09/04/2016   Bartholin gland cyst    Breast cancer ([place])    Cervical intraepithelial neoplasia (CIN) 2005   high grade cervical dysplasia prior to total hysterectomy - needs yearly vaginal paps    GERD (gastroesophageal reflux disease)    Granular cell tumor - distal esophagus - smal 09/04/2016   HIV (human immunodeficiency virus infection) ([place])    Hyperlipidemia    Hypertension    IBS (irritable bowel syndrome)    Insomnia    Substance abuse ([place])     Current Outpatient Medications  Medication Sig Dispense Refill   nirmatrelvir/ritonavir EUA, renal dosing, (PAXLOVID) 10 x 150 MG & 10 x '100MG'$  TABS Take 2 tablets by mouth 2 (two) times daily for 5 days. (Take nirmatrelvir 150 mg one tablet twice daily for 5 days and ritonavir 100 mg one tablet twice daily for 5 days) Patient GFR is 57 20 tablet 0   ondansetron (ZOFRAN-ODT) 4 MG disintegrating tablet Take 1 tablet (4 mg total) by mouth every 8 (eight) hours as needed for nausea or vomiting. 20 tablet 0   albuterol (VENTOLIN HFA) 108 (90 Base) MCG/ACT  inhaler Inhale 1 puff into the lungs every 6 (six) hours as needed for wheezing or shortness of breath. 8.5 g 3   bictegravir-emtricitabine-tenofovir AF (BIKTARVY) 50-200-25 MG TABS tablet TAKE 1 TABLET BY MOUTH DAILY. TRY TO TAKE AT THE SAME TIME EACH DAY WITH OR WITHOUT FOOD. 30 tablet 11   buPROPion (WELLBUTRIN XL) 150 MG 24 hr tablet  Take 1 tablet (150 mg total) by mouth daily. 30 tablet 5   colesevelam (WELCHOL) 625 MG tablet Take 1 tablet by mouth 3 times daily. 90 tablet 11   lisinopril-hydrochlorothiazide (ZESTORETIC) 20-25 MG tablet TAKE 1 TABLET BY MOUTH DAILY. 90 tablet 3   omeprazole (PRILOSEC) 40 MG capsule TAKE 1 CAPSULE BY MOUTH ONCE A DAY 90 capsule 2   potassium chloride SA (KLOR-CON M) 20 MEQ tablet TAKE 1 TABLET BY MOUTH ONCE DAILY 90 tablet 3   simvastatin (ZOCOR) 20 MG tablet TAKE 1 TABLET BY MOUTH ONCE A DAY 30 tablet 11   zolpidem (AMBIEN CR) 12.5 MG CR tablet Take 1 tablet by mouth at bedtime as needed for sleep. 30 tablet 5   No current facility-administered medications for this visit.    Allergies  Allergen Reactions   Codeine     REACTION: hallucinations    Social History   Socioeconomic History   Marital status: Legally Separated    Spouse name: Not on file   Number of children: 2   Years of education: Not on file   Highest education level: Not on file  Occupational History   Occupation: Customer service rep    Employer: [employer]  Tobacco Use   Smoking status: Every Day    Packs/day: 0.30    Years: 30.00    Total pack years: 9.00    Types: Cigarettes   Smokeless tobacco: Never   Tobacco comments:    Pt states she is working on quitting  Scientific laboratory technician Use: Never used  Substance and Sexual Activity   Alcohol use: No    Alcohol/week: 0.0 standard drinks of alcohol   Drug use: No   Sexual activity: Not Currently    Partners: Male    Comment:  declined condoms  Other Topics Concern   Not on file  Social History Narrative   Married but legally separated, 2 daughters grandchild at home   Customer service rep for [employer]   No  alcohol or drug use   She is a smoker   Social Determinants of Radio broadcast assistant Strain: Not on file  Food Insecurity: Not on file  Transportation Needs: Not on file  Physical Activity: Not on file  Stress: Not on file   Social Connections: Not on file  Intimate Partner Violence: Not on file    Review of Systems  Constitutional: Negative.  Negative for chills and fever.  HENT:  Positive for congestion. Negative for sore throat.   Respiratory:  Positive for cough. Negative for shortness of breath.   Cardiovascular:  Negative for chest pain and palpitations.  Gastrointestinal:  Positive for diarrhea, nausea and vomiting. Negative for abdominal pain.  Genitourinary: Negative.   Skin: Negative.  Negative for rash.  Neurological:  Positive for headaches. Negative for dizziness.  All other systems reviewed and are negative.   Objective  Alert and oriented x3 in no apparent respiratory distress Vitals as reported by the patient: There were no vitals filed for this visit.  Diagnoses and all orders for this visit:  COVID-19 virus infection -  nirmatrelvir/ritonavir EUA, renal dosing, (PAXLOVID) 10 x 150 MG & 10 x '100MG'$  TABS; Take 2 tablets by mouth 2 (two) times daily for 5 days. (Take nirmatrelvir 150 mg one tablet twice daily for 5 days and ritonavir 100 mg one tablet twice daily for 5 days) Patient GFR is 57  Nausea and vomiting, unspecified vomiting type -     ondansetron (ZOFRAN-ODT) 4 MG disintegrating tablet; Take 1 tablet (4 mg total) by mouth every 8 (eight) hours as needed for nausea or vomiting.  Clinically stable.  No red flag signs or symptoms. Zofran for nausea and vomiting. May benefit from Paxlovid, renal dosing, twice a day for 5 days COVID instructions given ED precautions given Advised to contact the office if no better or worse during the next several days.   I discussed the assessment and treatment plan with the patient. The patient was provided an opportunity to ask questions and all were answered. The patient agreed with the plan and demonstrated an understanding of the instructions.   The patient was advised to call back or seek an in-person evaluation if the symptoms worsen  or if the condition fails to improve as anticipated.  I provided 30 minutes of non-face-to-face time during this encounter.  Horald Pollen, MD  Primary Care at [facility]

## 2021-08-30 MED ORDER — ZOLPIDEM TARTRATE ER 12.5 MG PO TBCR
12.5000 mg | EXTENDED_RELEASE_TABLET | Freq: Every evening | ORAL | 1 refills | Status: DC | PRN
  Filled 2021-08-30: qty 30, 30d supply, fill #0
  Filled 2021-09-26: qty 30, 30d supply, fill #1

## 2021-09-01 ENCOUNTER — Other Ambulatory Visit (HOSPITAL_COMMUNITY): Payer: Self-pay

## 2021-09-01 ENCOUNTER — Other Ambulatory Visit: Payer: BC Managed Care – PPO

## 2021-09-04 ENCOUNTER — Other Ambulatory Visit (HOSPITAL_COMMUNITY): Payer: Self-pay

## 2021-09-24 ENCOUNTER — Other Ambulatory Visit: Payer: BC Managed Care – PPO

## 2021-09-26 ENCOUNTER — Other Ambulatory Visit (HOSPITAL_COMMUNITY): Payer: Self-pay

## 2021-09-30 ENCOUNTER — Other Ambulatory Visit (HOSPITAL_COMMUNITY): Payer: Self-pay

## 2021-10-06 ENCOUNTER — Other Ambulatory Visit (HOSPITAL_COMMUNITY): Payer: Self-pay

## 2021-10-08 ENCOUNTER — Other Ambulatory Visit (HOSPITAL_COMMUNITY): Payer: Self-pay

## 2021-10-23 ENCOUNTER — Other Ambulatory Visit (HOSPITAL_COMMUNITY): Payer: Self-pay

## 2021-10-28 ENCOUNTER — Other Ambulatory Visit: Payer: Self-pay | Admitting: Internal Medicine

## 2021-10-28 ENCOUNTER — Other Ambulatory Visit (HOSPITAL_COMMUNITY): Payer: Self-pay

## 2021-10-28 MED ORDER — ZOLPIDEM TARTRATE ER 12.5 MG PO TBCR
12.5000 mg | EXTENDED_RELEASE_TABLET | Freq: Every evening | ORAL | 1 refills | Status: DC | PRN
  Filled 2021-10-28: qty 30, 30d supply, fill #0
  Filled 2021-11-27: qty 30, 30d supply, fill #1

## 2021-10-29 ENCOUNTER — Other Ambulatory Visit (HOSPITAL_COMMUNITY): Payer: Self-pay

## 2021-10-31 ENCOUNTER — Other Ambulatory Visit (HOSPITAL_COMMUNITY): Payer: Self-pay

## 2021-11-03 ENCOUNTER — Other Ambulatory Visit (HOSPITAL_COMMUNITY): Payer: Self-pay

## 2021-11-04 ENCOUNTER — Other Ambulatory Visit: Payer: BC Managed Care – PPO

## 2021-11-11 ENCOUNTER — Encounter: Payer: Self-pay | Admitting: Infectious Diseases

## 2021-11-11 ENCOUNTER — Ambulatory Visit (INDEPENDENT_AMBULATORY_CARE_PROVIDER_SITE_OTHER): Payer: BC Managed Care – PPO | Admitting: Infectious Diseases

## 2021-11-11 ENCOUNTER — Other Ambulatory Visit: Payer: Self-pay

## 2021-11-11 ENCOUNTER — Other Ambulatory Visit (HOSPITAL_COMMUNITY): Payer: Self-pay

## 2021-11-11 VITALS — BP 132/89 | HR 86 | Resp 16 | Ht 64.0 in | Wt 196.0 lb

## 2021-11-11 DIAGNOSIS — Z23 Encounter for immunization: Secondary | ICD-10-CM | POA: Diagnosis not present

## 2021-11-11 DIAGNOSIS — B20 Human immunodeficiency virus [HIV] disease: Secondary | ICD-10-CM | POA: Diagnosis not present

## 2021-11-11 MED ORDER — BIKTARVY 50-200-25 MG PO TABS
ORAL_TABLET | ORAL | 11 refills | Status: DC
  Filled 2021-11-11: qty 30, fill #0
  Filled 2021-11-25: qty 30, 30d supply, fill #0
  Filled 2021-12-24: qty 30, 30d supply, fill #1
  Filled 2022-01-21: qty 30, 30d supply, fill #2
  Filled 2022-02-13: qty 30, 30d supply, fill #3
  Filled 2022-03-17: qty 30, 30d supply, fill #4
  Filled 2022-04-15: qty 30, 30d supply, fill #5
  Filled 2022-05-12 – 2022-05-27 (×2): qty 30, 30d supply, fill #6
  Filled 2022-06-23: qty 30, 30d supply, fill #7
  Filled 2022-07-22: qty 30, 30d supply, fill #8
  Filled 2022-08-19: qty 30, 30d supply, fill #9
  Filled 2022-09-15: qty 30, 30d supply, fill #10
  Filled 2022-10-09 – 2022-10-13 (×2): qty 30, 30d supply, fill #11

## 2021-11-11 NOTE — Progress Notes (Signed)
Patient Name: Amy Hernandez  Date of Birth: 05/07/1965 MRN: [id number]  PCP: Hoyt Koch, MD     SUBJECTIVE:  Brief Narrative:  Amy Hernandez is a 56 y.o. female with well controlled HIV on Biktarvy. HIV Risk: sexual OI Hx: none CD4 nadir > 200 by report  Previous Regimens:  Atripla - well controlled but required switch d/t insurance  Odefsey - did not like s/e or food requirement  Biktarvy 06/2017 - suppressed   Genotype:  sensitive     CC:  Chief Complaint  Patient presents with   Follow-up     HPI: Here for routine yearly follow up.  We spent time catching up since our last visit together.   Following with Dr. Silverio Decamp (GI) and Dr. Sharlet Salina with primary care. Very happy to have them on her healthcare team. Diarrhea has been better controlled with new medication.   Recovered from [place] in August of this year. Has had lingering breathing symptoms - walking and singing have noticed more shortness of breath. Has noticed some coughing but only a/w cigarettes. Has noticed that she has decreased cigarette use in a while. Is trying to get back on CPAP use regularly - had to replace some parts and pay out of pocket for that but CPAP does help her sleep.   Continues to take her biktarvy everyday without any concern for access or side effect. No missed doses. Has been suppressed for a long time doing very well.    Review of Systems  Constitutional:  Negative for chills, fever, malaise/fatigue and weight loss.  HENT:  Negative for sore throat.   Respiratory:  Positive for shortness of breath. Negative for cough and sputum production.   Cardiovascular: Negative.   Gastrointestinal:  Negative for abdominal pain, diarrhea and vomiting.  Musculoskeletal:  Negative for joint pain, myalgias and neck pain.  Skin:  Negative for rash.  Neurological:  Negative for headaches.  Psychiatric/Behavioral:  Negative for depression and substance abuse. The patient is not  nervous/anxious.      Past Medical History:  Diagnosis Date   Barrett's esophagus - short segment 09/04/2016   Bartholin gland cyst    Breast cancer ([place])    Cervical intraepithelial neoplasia (CIN) 2005   high grade cervical dysplasia prior to total hysterectomy - needs yearly vaginal paps    GERD (gastroesophageal reflux disease)    Granular cell tumor - distal esophagus - smal 09/04/2016   HIV (human immunodeficiency virus infection) ([place])    Hyperlipidemia    Hypertension    IBS (irritable bowel syndrome)    Insomnia    Substance abuse ([place])     Outpatient Medications Prior to Visit  Medication Sig Dispense Refill   albuterol (VENTOLIN HFA) 108 (90 Base) MCG/ACT inhaler Inhale 1 puff into the lungs every 6 (six) hours as needed for wheezing or shortness of breath. 8.5 g 3   buPROPion (WELLBUTRIN XL) 150 MG 24 hr tablet Take 1 tablet (150 mg total) by mouth daily. 30 tablet 5   colesevelam (WELCHOL) 625 MG tablet Take 1 tablet by mouth 3 times daily. 90 tablet 11   lisinopril-hydrochlorothiazide (ZESTORETIC) 20-25 MG tablet TAKE 1 TABLET BY MOUTH DAILY. 90 tablet 3   omeprazole (PRILOSEC) 40 MG capsule TAKE 1 CAPSULE BY MOUTH ONCE A DAY 90 capsule 2   ondansetron (ZOFRAN-ODT) 4 MG disintegrating tablet Dissolve 1 tablet (4 mg total) by mouth every 8 (eight) hours as needed for nausea or vomiting. 20 tablet 0  potassium chloride SA (KLOR-CON M) 20 MEQ tablet TAKE 1 TABLET BY MOUTH ONCE DAILY 90 tablet 3   simvastatin (ZOCOR) 20 MG tablet TAKE 1 TABLET BY MOUTH ONCE A DAY 30 tablet 11   zolpidem (AMBIEN CR) 12.5 MG CR tablet Take 1 tablet by mouth at bedtime as needed for sleep. 30 tablet 1   bictegravir-emtricitabine-tenofovir AF (BIKTARVY) 50-200-25 MG TABS tablet TAKE 1 TABLET BY MOUTH DAILY. TRY TO TAKE AT THE SAME TIME EACH DAY WITH OR WITHOUT FOOD. 30 tablet 11   No facility-administered medications prior to visit.    Allergies  Allergen Reactions   Codeine      REACTION: hallucinations    Physical Assessment & Diagnostic Findings: Vitals:   11/11/21 0947  BP: 132/89  Pulse: 86  Resp: 16  SpO2: 99%  Weight: 196 lb (88.9 kg)  Height: '5\' 4"'$  (1.626 m)   Body mass index is 33.64 kg/m.   Physical Exam HENT:     Mouth/Throat:     Mouth: No oral lesions.     Dentition: No dental abscesses.  Cardiovascular:     Rate and Rhythm: Normal rate and regular rhythm.     Heart sounds: Normal heart sounds.  Pulmonary:     Effort: Pulmonary effort is normal.     Breath sounds: Normal breath sounds.  Abdominal:     General: There is no distension.     Palpations: Abdomen is soft.     Tenderness: There is no abdominal tenderness.  Musculoskeletal:        General: No tenderness. Normal range of motion.  Lymphadenopathy:     Cervical: No cervical adenopathy.  Skin:    General: Skin is warm and dry.     Findings: No rash.  Neurological:     Mental Status: She is alert and oriented to person, place, and time.  Psychiatric:        Judgment: Judgment normal.    Deferred as our visit was spent in direct counseling    Lab Results Lab Results  Component Value Date   CREATININE 1.27 (H) 06/12/2020   BUN 14 06/12/2020   NA 139 06/12/2020   K 3.6 06/12/2020   CL 100 06/12/2020   CO2 27 06/12/2020    Lab Results  Component Value Date   ALT 23 03/20/2021   AST 27 03/20/2021   ALKPHOS 85 03/20/2021   BILITOT 0.3 03/20/2021    Lab Results  Component Value Date   CHOL 173 04/03/2019   HDL 40 (L) 04/03/2019   LDLCALC 104 (H) 04/03/2019   TRIG 172 (H) 04/03/2019   CHOLHDL 4.3 04/03/2019   HIV 1 RNA Quant  Date Value  11/12/2020 Not Detected Copies/mL  05/07/2020 Not Detected Copies/mL  04/03/2019 <20 NOT DETECTED copies/mL   CD4 T Cell Abs (/uL)  Date Value  11/12/2020 1,224  05/07/2020 1,243  04/03/2019 1,302   Problem List Items Addressed This Visit       Unprioritized   Human immunodeficiency virus (HIV) disease ([place])  (Chronic)    Very well controlled on once daily biktarvy. No concerns with access or adherence to medication. They are tolerating the medication well without side effects. No drug interactions identified. Pertinent lab tests ordered today. She is doing very well and can come back for yearly check ins.  No changes to insurance coverage.  No dental needs today.  No concern over anxious/depressed mood.  Sexual health and family planning discussed - none needed.  Vaccines updated  today - flu shot and Tdap today. Pap smear is due in 2025 after meeting with GYN in 2022.    Return in about 1 year (around 11/12/2022).        Relevant Medications   bictegravir-emtricitabine-tenofovir AF (BIKTARVY) 50-200-25 MG TABS tablet   Other Relevant Orders   CBC   COMPLETE METABOLIC PANEL WITH GFR   HIV 1 RNA quant-no reflex-bld   T-helper cells (CD4) count   Other Visit Diagnoses     Need for tetanus, diphtheria, and acellular pertussis (Tdap) vaccine    -  Primary   Relevant Orders   Tdap vaccine greater than or equal to 7yo IM (Completed)   Need for immunization against influenza       Relevant Orders   Flu Vaccine QUAD 42moIM (Fluarix, Fluzone & Alfiuria Quad PF) (Completed)       SJanene Madeira MSN, NP-C R[facility]for Infectious Disease [email or web address] Pager: [phone]: [phone]. [place] [phone]

## 2021-11-11 NOTE — Patient Instructions (Addendum)
So nice to see you!   Please continue the [place] everyday as you have been - refills send in  Will plan to see you back in 1 year   Would consider the COVID booster sometime between December / February 2024.   Next pap smear due in 2025.   [facility] Breathing Exercises:  [email or web address]

## 2021-11-11 NOTE — Assessment & Plan Note (Addendum)
Very well controlled on once daily biktarvy. No concerns with access or adherence to medication. They are tolerating the medication well without side effects. No drug interactions identified. Pertinent lab tests ordered today. She is doing very well and can come back for yearly check ins.  No changes to insurance coverage.  No dental needs today.  No concern over anxious/depressed mood.  Sexual health and family planning discussed - none needed.  Vaccines updated today - flu shot and Tdap today. Pap smear is due in 2025 after meeting with GYN in 2022.    Return in about 1 year (around 11/12/2022).

## 2021-11-12 ENCOUNTER — Other Ambulatory Visit: Payer: Self-pay | Admitting: Infectious Diseases

## 2021-11-12 LAB — T-HELPER CELLS (CD4) COUNT (NOT AT ARMC)
CD4 % Helper T Cell: 35 % (ref 33–65)
CD4 T Cell Abs: 969 /uL (ref 400–1790)

## 2021-11-14 LAB — CBC
HCT: 42.2 % (ref 35.0–45.0)
Hemoglobin: 14.7 g/dL (ref 11.7–15.5)
MCH: 32.1 pg (ref 27.0–33.0)
MCHC: 34.8 g/dL (ref 32.0–36.0)
MCV: 92.1 fL (ref 80.0–100.0)
MPV: 11.2 fL (ref 7.5–12.5)
Platelets: 262 10*3/uL (ref 140–400)
RBC: 4.58 10*6/uL (ref 3.80–5.10)
RDW: 12.7 % (ref 11.0–15.0)
WBC: 8.5 10*3/uL (ref 3.8–10.8)

## 2021-11-14 LAB — COMPLETE METABOLIC PANEL WITH GFR
AG Ratio: 1.5 (calc) (ref 1.0–2.5)
ALT: 31 U/L — ABNORMAL HIGH (ref 6–29)
AST: 52 U/L — ABNORMAL HIGH (ref 10–35)
Albumin: 4.5 g/dL (ref 3.6–5.1)
Alkaline phosphatase (APISO): 99 U/L (ref 37–153)
BUN/Creatinine Ratio: 11 (calc) (ref 6–22)
BUN: 12 mg/dL (ref 7–25)
CO2: 30 mmol/L (ref 20–32)
Calcium: 9.7 mg/dL (ref 8.6–10.4)
Chloride: 100 mmol/L (ref 98–110)
Creat: 1.1 mg/dL — ABNORMAL HIGH (ref 0.50–1.03)
Globulin: 3 g/dL (calc) (ref 1.9–3.7)
Glucose, Bld: 104 mg/dL — ABNORMAL HIGH (ref 65–99)
Potassium: 3.7 mmol/L (ref 3.5–5.3)
Sodium: 139 mmol/L (ref 135–146)
Total Bilirubin: 0.3 mg/dL (ref 0.2–1.2)
Total Protein: 7.5 g/dL (ref 6.1–8.1)
eGFR: 60 mL/min/{1.73_m2} (ref >=60)

## 2021-11-14 LAB — HIV-1 RNA QUANT-NO REFLEX-BLD
HIV 1 RNA Quant: NOT DETECTED Copies/mL
HIV-1 RNA Quant, Log: NOT DETECTED Log cps/mL

## 2021-11-14 LAB — LIPID PANEL
Cholesterol: 222 mg/dL — ABNORMAL HIGH (ref <200)
HDL: 45 mg/dL — ABNORMAL LOW (ref >=50)
LDL Cholesterol (Calc): 137 mg/dL (calc) — ABNORMAL HIGH
Non-HDL Cholesterol (Calc): 177 mg/dL (calc) — ABNORMAL HIGH (ref <130)
Total CHOL/HDL Ratio: 4.9 (calc) (ref <5.0)
Triglycerides: 259 mg/dL — ABNORMAL HIGH (ref <150)

## 2021-11-21 ENCOUNTER — Telehealth: Payer: Self-pay

## 2021-11-21 NOTE — Telephone Encounter (Signed)
-----   Message from Hoyt Koch, MD sent at 11/21/2021  8:13 AM EDT ----- Your cholesterol is higher than in the past. Are you still taking the welchol and the simvastatin? If so we should change the dose. If not we should restart these.

## 2021-11-21 NOTE — Telephone Encounter (Signed)
Callback for pt. Left a voicemail

## 2021-11-24 ENCOUNTER — Other Ambulatory Visit (HOSPITAL_COMMUNITY): Payer: Self-pay

## 2021-11-24 ENCOUNTER — Other Ambulatory Visit: Payer: Self-pay | Admitting: Internal Medicine

## 2021-11-24 DIAGNOSIS — E785 Hyperlipidemia, unspecified: Secondary | ICD-10-CM

## 2021-11-24 MED ORDER — SIMVASTATIN 40 MG PO TABS
40.0000 mg | ORAL_TABLET | Freq: Every day | ORAL | 3 refills | Status: DC
  Filled 2021-11-24: qty 90, 90d supply, fill #0

## 2021-11-25 ENCOUNTER — Other Ambulatory Visit: Payer: Self-pay | Admitting: Gastroenterology

## 2021-11-25 ENCOUNTER — Other Ambulatory Visit: Payer: Self-pay | Admitting: Neurology

## 2021-11-25 ENCOUNTER — Other Ambulatory Visit: Payer: Self-pay | Admitting: Infectious Diseases

## 2021-11-25 ENCOUNTER — Other Ambulatory Visit (HOSPITAL_COMMUNITY): Payer: Self-pay

## 2021-11-25 DIAGNOSIS — I10 Essential (primary) hypertension: Secondary | ICD-10-CM

## 2021-11-25 MED ORDER — BUPROPION HCL ER (XL) 150 MG PO TB24
150.0000 mg | ORAL_TABLET | Freq: Every day | ORAL | 5 refills | Status: AC
  Filled 2021-11-29 – 2021-12-24 (×2): qty 30, 30d supply, fill #0
  Filled 2022-04-29: qty 30, 30d supply, fill #1
  Filled 2022-06-26 – 2022-07-07 (×2): qty 30, 30d supply, fill #2
  Filled 2022-10-05: qty 30, 30d supply, fill #3
  Filled 2022-11-03: qty 30, 30d supply, fill #4

## 2021-11-26 ENCOUNTER — Other Ambulatory Visit (HOSPITAL_COMMUNITY): Payer: Self-pay

## 2021-11-26 MED ORDER — LISINOPRIL-HYDROCHLOROTHIAZIDE 20-25 MG PO TABS
1.0000 | ORAL_TABLET | Freq: Every day | ORAL | 3 refills | Status: DC
  Filled 2021-11-26: qty 90, 90d supply, fill #0
  Filled 2022-03-17: qty 90, 90d supply, fill #1
  Filled 2022-06-26 – 2022-07-07 (×2): qty 90, 90d supply, fill #2
  Filled 2022-10-05: qty 90, 90d supply, fill #3

## 2021-11-27 ENCOUNTER — Other Ambulatory Visit (HOSPITAL_COMMUNITY): Payer: Self-pay

## 2021-11-28 ENCOUNTER — Other Ambulatory Visit: Payer: Self-pay | Admitting: Infectious Diseases

## 2021-11-28 DIAGNOSIS — K580 Irritable bowel syndrome with diarrhea: Secondary | ICD-10-CM

## 2021-11-29 ENCOUNTER — Other Ambulatory Visit (HOSPITAL_COMMUNITY): Payer: Self-pay

## 2021-12-01 ENCOUNTER — Other Ambulatory Visit (HOSPITAL_COMMUNITY): Payer: Self-pay

## 2021-12-01 ENCOUNTER — Telehealth: Payer: Self-pay | Admitting: Internal Medicine

## 2021-12-01 NOTE — Telephone Encounter (Signed)
Would you like me to see if PCP can take over this Rx? Looks like she's following with Dr. Sharlet Salina. Thanks!

## 2021-12-01 NOTE — Telephone Encounter (Signed)
Patient is out of her [facility] told her that this is medication if out of stock with the manufacturer until mid November.  Patient will need something else called in for sleep.  Patient is completely out of her medication.  Please advise.

## 2021-12-02 ENCOUNTER — Other Ambulatory Visit (HOSPITAL_COMMUNITY): Payer: Self-pay

## 2021-12-02 MED ORDER — POTASSIUM CHLORIDE CRYS ER 20 MEQ PO TBCR
20.0000 meq | EXTENDED_RELEASE_TABLET | Freq: Every day | ORAL | 3 refills | Status: DC
  Filled 2021-12-02: qty 90, 90d supply, fill #0
  Filled 2022-03-17: qty 90, 90d supply, fill #1
  Filled 2022-06-26 – 2022-07-07 (×2): qty 90, 90d supply, fill #2
  Filled 2022-10-05: qty 90, 90d supply, fill #3

## 2021-12-02 NOTE — Telephone Encounter (Signed)
Patient called to follow up on this because she said she really needs to be able to sleep. Please advise

## 2021-12-02 NOTE — Telephone Encounter (Signed)
I would recommend she call some other local pharmacies to see if they have this in stock before we change.

## 2021-12-02 NOTE — Telephone Encounter (Signed)
Pt reports sleeping slightly only 2 hours last night and pt has a bad headache today.  Pharmacy said Lorrin Mais is out and unavailable until possibly mid November.  Please call in a different sleep medication to same pharmacy on file:  [facility] [place], [place] [address] Phone: [phone]  Fax: [phone]   *Pharmacy Comments: this is on manuf. b/o - explained to pt 11.6.23 ALR - asked her to call md to see if they will change to something else   Pt request phone call [phone]

## 2021-12-03 ENCOUNTER — Other Ambulatory Visit (HOSPITAL_COMMUNITY): Payer: Self-pay

## 2021-12-03 NOTE — Telephone Encounter (Signed)
Spoke with patient and she is going to give a call to a few pharmacist and call us back if she finds one, or if they dont to still give [place] a call back

## 2021-12-12 ENCOUNTER — Ambulatory Visit: Payer: BC Managed Care – PPO

## 2021-12-12 ENCOUNTER — Other Ambulatory Visit: Payer: Self-pay | Admitting: Advanced Practice Midwife

## 2021-12-12 ENCOUNTER — Ambulatory Visit
Admission: RE | Admit: 2021-12-12 | Discharge: 2021-12-12 | Disposition: A | Discharge status: Home / Self Care | Payer: BC Managed Care – PPO | Source: Ambulatory Visit | Attending: Advanced Practice Midwife | Admitting: Advanced Practice Midwife

## 2021-12-12 ENCOUNTER — Other Ambulatory Visit (HOSPITAL_COMMUNITY): Payer: Self-pay

## 2021-12-12 DIAGNOSIS — R921 Mammographic calcification found on diagnostic imaging of breast: Secondary | ICD-10-CM | POA: Diagnosis not present

## 2021-12-12 DIAGNOSIS — R928 Other abnormal and inconclusive findings on diagnostic imaging of breast: Secondary | ICD-10-CM

## 2021-12-12 MED ORDER — OMEPRAZOLE 40 MG PO CPDR
DELAYED_RELEASE_CAPSULE | Freq: Every day | ORAL | 2 refills | Status: DC
  Filled 2021-12-12: qty 90, 90d supply, fill #0
  Filled 2022-03-17: qty 90, 90d supply, fill #1
  Filled 2022-06-26 – 2022-07-07 (×2): qty 90, 90d supply, fill #2

## 2021-12-24 ENCOUNTER — Other Ambulatory Visit (HOSPITAL_COMMUNITY): Payer: Self-pay

## 2021-12-25 ENCOUNTER — Ambulatory Visit: Payer: BC Managed Care – PPO | Admitting: Emergency Medicine

## 2021-12-27 ENCOUNTER — Other Ambulatory Visit (HOSPITAL_COMMUNITY): Payer: Self-pay

## 2021-12-29 ENCOUNTER — Other Ambulatory Visit: Payer: Self-pay | Admitting: Internal Medicine

## 2021-12-29 ENCOUNTER — Other Ambulatory Visit (HOSPITAL_COMMUNITY): Payer: Self-pay

## 2021-12-29 MED ORDER — ZOLPIDEM TARTRATE ER 12.5 MG PO TBCR
12.5000 mg | EXTENDED_RELEASE_TABLET | Freq: Every evening | ORAL | 5 refills | Status: DC | PRN
  Filled 2021-12-29 – 2021-12-30 (×2): qty 30, 30d supply, fill #0
  Filled 2022-01-27: qty 30, 30d supply, fill #1
  Filled 2022-02-26: qty 30, 30d supply, fill #2
  Filled 2022-03-30: qty 30, 30d supply, fill #3
  Filled 2022-04-29: qty 30, 30d supply, fill #4
  Filled 2022-05-27: qty 30, 30d supply, fill #5

## 2021-12-30 ENCOUNTER — Other Ambulatory Visit (HOSPITAL_COMMUNITY): Payer: Self-pay

## 2021-12-31 ENCOUNTER — Ambulatory Visit
Admission: RE | Admit: 2021-12-31 | Discharge: 2021-12-31 | Disposition: A | Discharge status: Home / Self Care | Payer: BC Managed Care – PPO | Source: Ambulatory Visit | Attending: Advanced Practice Midwife | Admitting: Advanced Practice Midwife

## 2021-12-31 DIAGNOSIS — R928 Other abnormal and inconclusive findings on diagnostic imaging of breast: Secondary | ICD-10-CM

## 2021-12-31 DIAGNOSIS — R921 Mammographic calcification found on diagnostic imaging of breast: Secondary | ICD-10-CM | POA: Diagnosis not present

## 2021-12-31 DIAGNOSIS — N641 Fat necrosis of breast: Secondary | ICD-10-CM | POA: Diagnosis not present

## 2021-12-31 HISTORY — PX: BREAST BIOPSY: SHX20

## 2022-01-01 ENCOUNTER — Other Ambulatory Visit (HOSPITAL_COMMUNITY): Payer: Self-pay

## 2022-01-21 ENCOUNTER — Other Ambulatory Visit (HOSPITAL_COMMUNITY): Payer: Self-pay

## 2022-01-22 ENCOUNTER — Other Ambulatory Visit: Payer: Self-pay

## 2022-01-27 ENCOUNTER — Other Ambulatory Visit: Payer: Self-pay

## 2022-01-27 ENCOUNTER — Other Ambulatory Visit (HOSPITAL_COMMUNITY): Payer: Self-pay

## 2022-01-29 ENCOUNTER — Ambulatory Visit (INDEPENDENT_AMBULATORY_CARE_PROVIDER_SITE_OTHER): Payer: BC Managed Care – PPO | Admitting: Internal Medicine

## 2022-01-29 ENCOUNTER — Encounter: Payer: Self-pay | Admitting: Internal Medicine

## 2022-01-29 ENCOUNTER — Other Ambulatory Visit (HOSPITAL_COMMUNITY): Payer: Self-pay

## 2022-01-29 VITALS — BP 128/98 | HR 92 | Temp 98.3°F | Ht 64.0 in | Wt 197.0 lb

## 2022-01-29 DIAGNOSIS — E782 Mixed hyperlipidemia: Secondary | ICD-10-CM

## 2022-01-29 DIAGNOSIS — R748 Abnormal levels of other serum enzymes: Secondary | ICD-10-CM

## 2022-01-29 DIAGNOSIS — R0609 Other forms of dyspnea: Secondary | ICD-10-CM | POA: Diagnosis not present

## 2022-01-29 DIAGNOSIS — F17200 Nicotine dependence, unspecified, uncomplicated: Secondary | ICD-10-CM

## 2022-01-29 DIAGNOSIS — I10 Essential (primary) hypertension: Secondary | ICD-10-CM | POA: Diagnosis not present

## 2022-01-29 DIAGNOSIS — B20 Human immunodeficiency virus [HIV] disease: Secondary | ICD-10-CM | POA: Diagnosis not present

## 2022-01-29 DIAGNOSIS — N644 Mastodynia: Secondary | ICD-10-CM

## 2022-01-29 LAB — COMPREHENSIVE METABOLIC PANEL
ALT: 34 U/L (ref 0–35)
AST: 42 U/L — ABNORMAL HIGH (ref 0–37)
Albumin: 4.4 g/dL (ref 3.5–5.2)
Alkaline Phosphatase: 89 U/L (ref 39–117)
BUN: 11 mg/dL (ref 6–23)
CO2: 32 mEq/L (ref 19–32)
Calcium: 10 mg/dL (ref 8.4–10.5)
Chloride: 99 mEq/L (ref 96–112)
Creatinine, Ser: 1.1 mg/dL (ref 0.40–1.20)
GFR: 56.85 mL/min — ABNORMAL LOW (ref >60.00)
Glucose, Bld: 102 mg/dL — ABNORMAL HIGH (ref 70–99)
Potassium: 3.6 mEq/L (ref 3.5–5.1)
Sodium: 137 mEq/L (ref 135–145)
Total Bilirubin: 0.3 mg/dL (ref 0.2–1.2)
Total Protein: 7.8 g/dL (ref 6.0–8.3)

## 2022-01-29 LAB — LIPID PANEL
Cholesterol: 233 mg/dL — ABNORMAL HIGH (ref 0–200)
HDL: 45.9 mg/dL (ref >39.00)
NonHDL: 186.61
Total CHOL/HDL Ratio: 5
Triglycerides: 298 mg/dL — ABNORMAL HIGH (ref 0.0–149.0)
VLDL: 59.6 mg/dL — ABNORMAL HIGH (ref 0.0–40.0)

## 2022-01-29 LAB — HEMOGLOBIN A1C: Hgb A1c MFr Bld: 5.9 % (ref 4.6–6.5)

## 2022-01-29 LAB — BRAIN NATRIURETIC PEPTIDE: Pro B Natriuretic peptide (BNP): 16 pg/mL (ref 0.0–100.0)

## 2022-01-29 LAB — LDL CHOLESTEROL, DIRECT: Direct LDL: 144 mg/dL

## 2022-01-29 MED ORDER — CEPHALEXIN 500 MG PO CAPS
500.0000 mg | ORAL_CAPSULE | Freq: Two times a day (BID) | ORAL | 0 refills | Status: AC
  Filled 2022-01-29: qty 10, 5d supply, fill #0

## 2022-01-29 NOTE — Patient Instructions (Addendum)
We will check the labs today.  We have sent in keflex to take 1 pill twice a day for 5 days.

## 2022-01-29 NOTE — Progress Notes (Signed)
   Subjective:   Patient ID: Amy Hernandez, female    DOB: [birth year], 57 y.o.   MRN: [id number]  HPI The patient is a 57 YO female coming in for follow up with concerns.   Review of Systems  Constitutional:  Positive for activity change and appetite change.  HENT: Negative.    Eyes: Negative.   Respiratory:  Negative for cough, chest tightness and shortness of breath.   Cardiovascular:  Negative for chest pain, palpitations and leg swelling.  Gastrointestinal:  Negative for abdominal distention, abdominal pain, constipation, diarrhea, nausea and vomiting.  Musculoskeletal: Negative.   Skin:  Positive for color change and wound.  Neurological: Negative.   Psychiatric/Behavioral: Negative.      Objective:  Physical Exam Constitutional:      Appearance: She is well-developed.  HENT:     Head: Normocephalic and atraumatic.  Cardiovascular:     Rate and Rhythm: Normal rate and regular rhythm.     Comments: Left breast with 1.5 cm circular healing biopsy site with purulent drainage no surrounding erythema Pulmonary:     Effort: Pulmonary effort is normal. No respiratory distress.     Breath sounds: Normal breath sounds. No wheezing or rales.  Abdominal:     General: Bowel sounds are normal. There is no distension.     Palpations: Abdomen is soft.     Tenderness: There is no abdominal tenderness. There is no rebound.  Musculoskeletal:     Cervical back: Normal range of motion.  Skin:    General: Skin is warm and dry.  Neurological:     Mental Status: She is alert and oriented to person, place, and time.     Coordination: Coordination normal.     Vitals:   01/29/22 0906 01/29/22 0911  BP: (!) 128/98 (!) 128/98  Pulse: 92   Temp: 98.3 F (36.8 C)   TempSrc: Oral   SpO2: 92%   Weight: 197 lb (89.4 kg)   Height: '5\' 4"'$  (1.626 m)     Assessment & Plan:

## 2022-01-29 NOTE — Assessment & Plan Note (Signed)
Checking CMP and adjust as needed.  

## 2022-01-29 NOTE — Assessment & Plan Note (Addendum)
BP elevated at visit and previously at goal. Likely diet change with holidays. Advised to cut back on sodium and observe BP. If persistently elevated at home we will adjust medications. Currently continue lisinopril/hctz 20/25. Checking CMP and adjust as needed.

## 2022-01-30 DIAGNOSIS — R0600 Dyspnea, unspecified: Secondary | ICD-10-CM | POA: Insufficient documentation

## 2022-01-30 LAB — T-HELPER CELLS (CD4) COUNT (NOT AT ARMC)
Absolute CD4: 1361 cells/uL (ref 490–1740)
CD4 T Helper %: 32 % (ref 30–61)
Total lymphocyte count: 4239 cells/uL — ABNORMAL HIGH (ref 850–3900)

## 2022-01-30 NOTE — Assessment & Plan Note (Signed)
We had increased dose of simvastatin to 40 mg daily Oct 2023 so rechecking lipid panel today.

## 2022-01-30 NOTE — Assessment & Plan Note (Signed)
New in the last few months. She did have covid-19 about 6 months ago but this was not concordant with this. She is a smoker and advised to quit. Checking CMP, BNP to rule out new problems. Lungs clear to exam.

## 2022-01-30 NOTE — Assessment & Plan Note (Signed)
Due for repeat CD4 count which is ordered today and will forward results to her ID provider. Taking biktarvy without missing or side effects.

## 2022-01-30 NOTE — Assessment & Plan Note (Signed)
Due to biopsy site with infection. Rx keflex 5 day course. Advised healing is happening from the biopsy site up to surface and could be extended from expectations.

## 2022-01-30 NOTE — Assessment & Plan Note (Signed)
Advised to quit and new dyspnea screening for causes today. Working on cutting back unable to make quit attempt.

## 2022-02-03 ENCOUNTER — Other Ambulatory Visit: Payer: Self-pay | Admitting: Internal Medicine

## 2022-02-03 ENCOUNTER — Other Ambulatory Visit: Payer: Self-pay

## 2022-02-03 MED ORDER — ATORVASTATIN CALCIUM 40 MG PO TABS
40.0000 mg | ORAL_TABLET | Freq: Every day | ORAL | 3 refills | Status: DC
  Filled 2022-02-03: qty 90, 90d supply, fill #0
  Filled 2022-04-29: qty 90, 90d supply, fill #1
  Filled 2022-07-27: qty 90, 90d supply, fill #2
  Filled 2022-11-03: qty 90, 90d supply, fill #3

## 2022-02-12 ENCOUNTER — Other Ambulatory Visit (HOSPITAL_COMMUNITY): Payer: Self-pay

## 2022-02-13 ENCOUNTER — Other Ambulatory Visit (HOSPITAL_COMMUNITY): Payer: Self-pay

## 2022-02-20 ENCOUNTER — Other Ambulatory Visit (HOSPITAL_COMMUNITY): Payer: Self-pay

## 2022-02-26 ENCOUNTER — Other Ambulatory Visit (HOSPITAL_COMMUNITY): Payer: Self-pay

## 2022-02-26 ENCOUNTER — Other Ambulatory Visit: Payer: Self-pay

## 2022-03-17 ENCOUNTER — Other Ambulatory Visit (HOSPITAL_COMMUNITY): Payer: Self-pay

## 2022-03-20 ENCOUNTER — Other Ambulatory Visit (HOSPITAL_COMMUNITY): Payer: Self-pay

## 2022-03-23 ENCOUNTER — Other Ambulatory Visit (HOSPITAL_COMMUNITY): Payer: Self-pay

## 2022-03-23 ENCOUNTER — Other Ambulatory Visit: Payer: Self-pay

## 2022-03-24 ENCOUNTER — Other Ambulatory Visit: Payer: BC Managed Care – PPO

## 2022-03-30 ENCOUNTER — Other Ambulatory Visit: Payer: Self-pay

## 2022-03-30 ENCOUNTER — Other Ambulatory Visit (HOSPITAL_COMMUNITY): Payer: Self-pay

## 2022-04-15 ENCOUNTER — Other Ambulatory Visit (HOSPITAL_COMMUNITY): Payer: Self-pay

## 2022-04-16 ENCOUNTER — Ambulatory Visit: Payer: BC Managed Care – PPO | Admitting: Adult Health

## 2022-04-17 ENCOUNTER — Other Ambulatory Visit: Payer: Self-pay

## 2022-04-20 ENCOUNTER — Other Ambulatory Visit: Payer: Self-pay

## 2022-04-23 ENCOUNTER — Other Ambulatory Visit: Payer: Self-pay

## 2022-04-23 ENCOUNTER — Other Ambulatory Visit: Payer: BC Managed Care – PPO

## 2022-04-23 DIAGNOSIS — B20 Human immunodeficiency virus [HIV] disease: Secondary | ICD-10-CM

## 2022-04-23 IMAGING — US US ABDOMEN LIMITED
1 series · 15 of 25 positions shown · non-contrast
Comparison: 07/30/2016

CLINICAL DATA: Abnormal liver function tests

EXAM:
ULTRASOUND ABDOMEN LIMITED RIGHT UPPER QUADRANT

[Series 1: us abdomen limited ruq mc & wl · 15 of 30 slices shown]
[im 1/30]
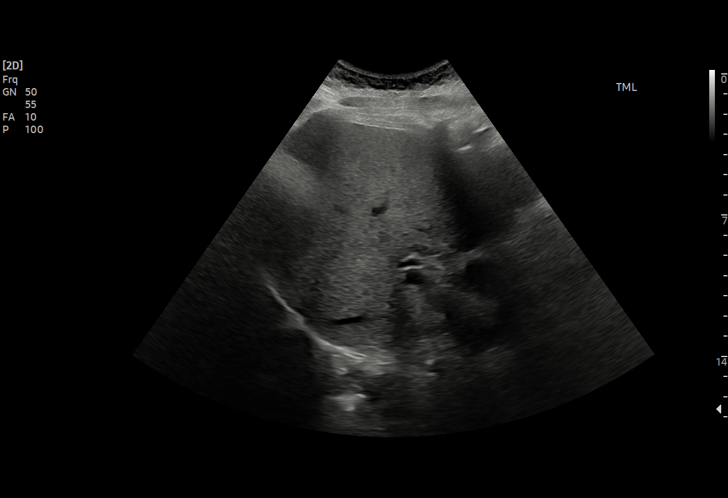
[im 3/30]
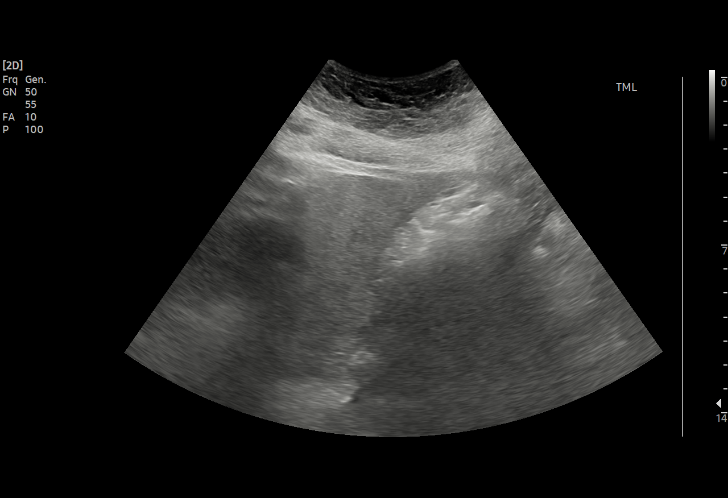
[im 5/30]
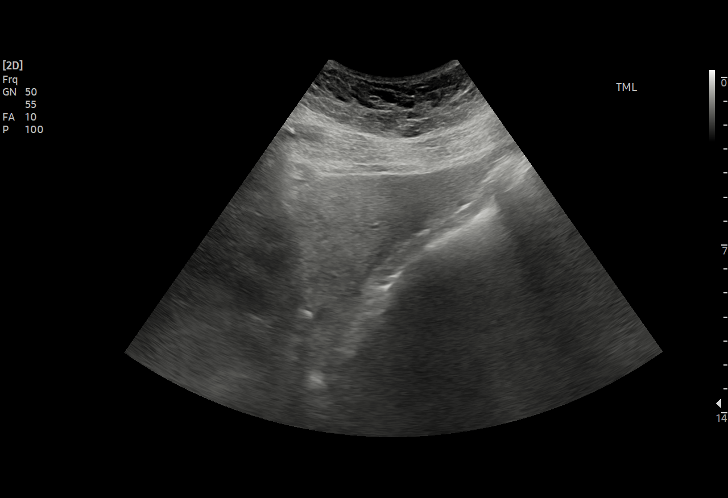
[im 7/30]
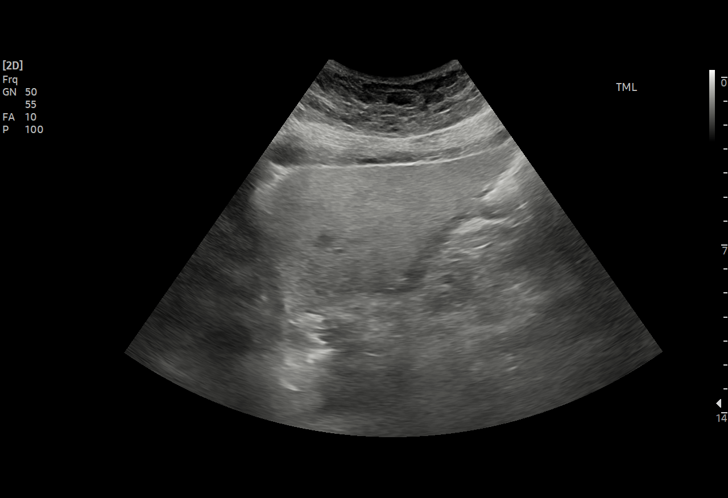
[im 9/30]
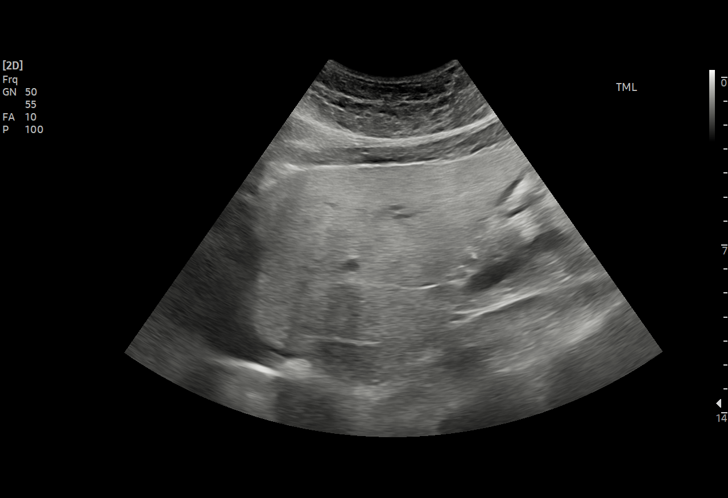
[im 11/30]
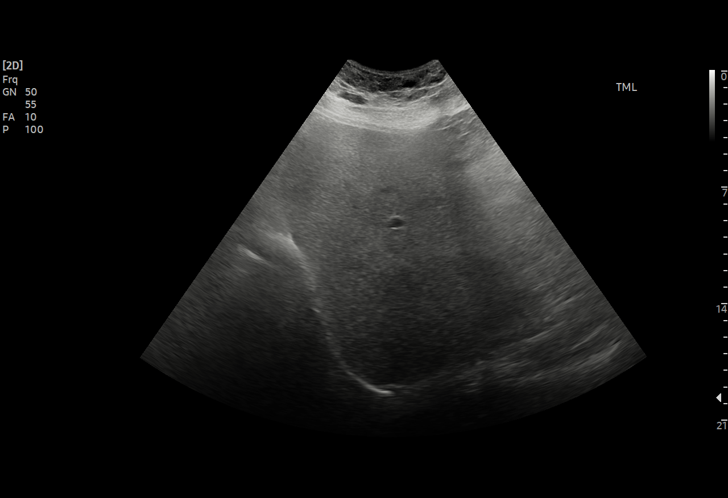
[im 13/30]
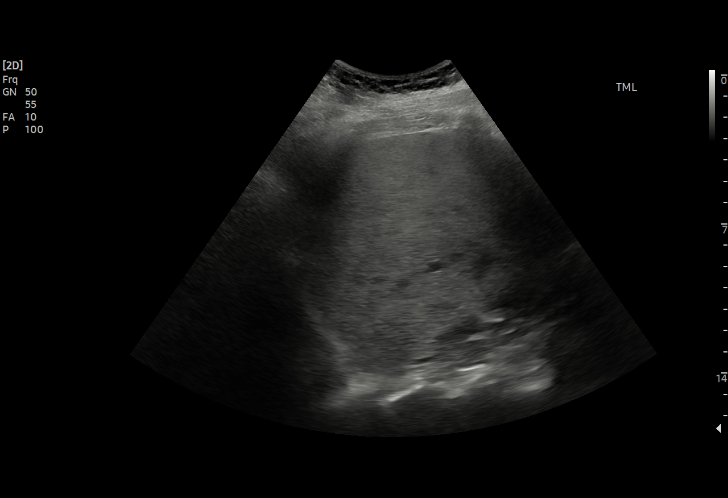
[im 15/30]
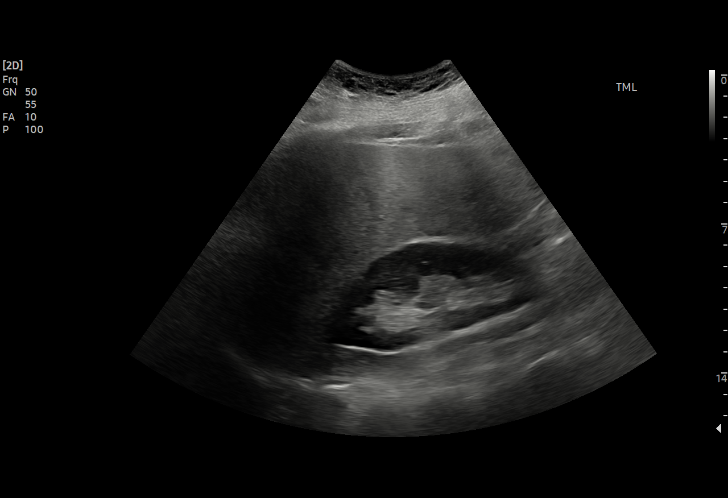
[im 17/30]
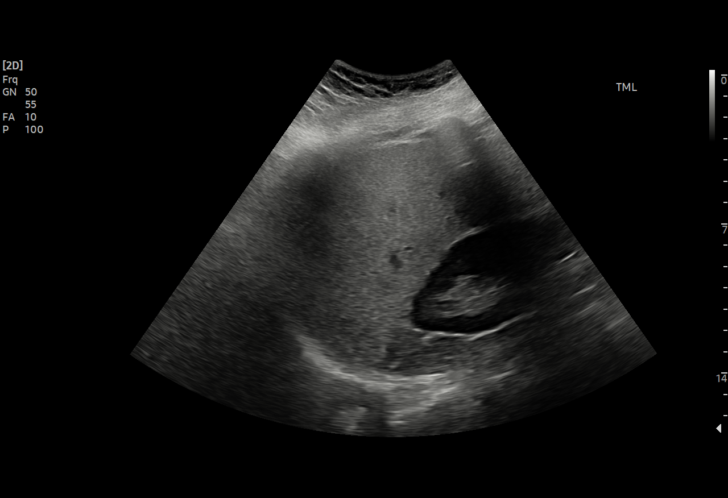
[im 19/30]
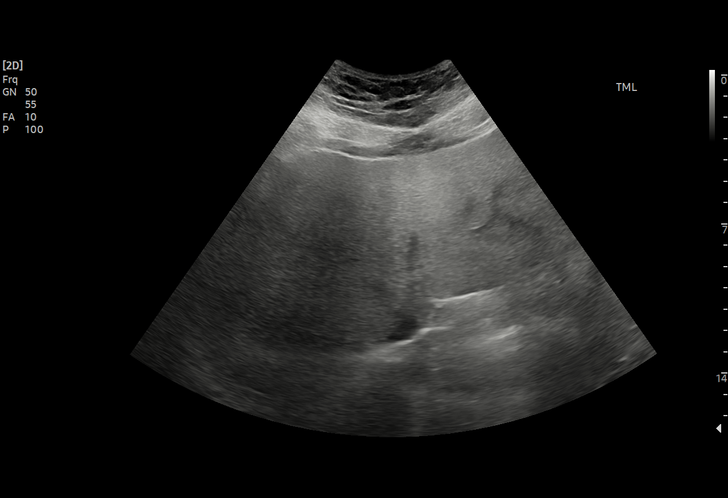
[im 21/30]
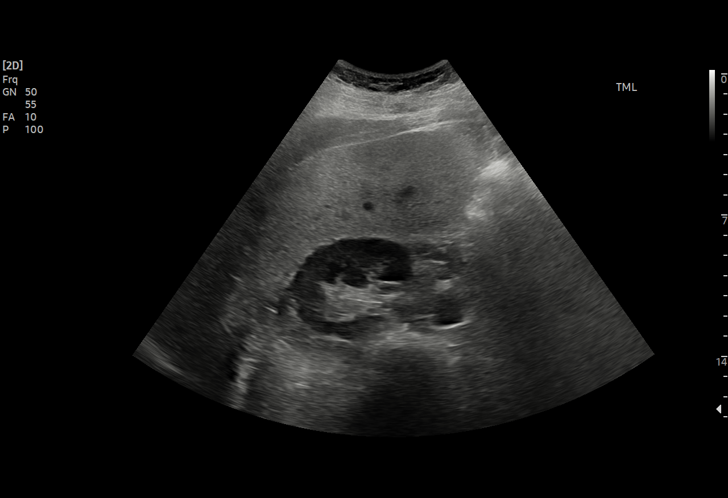
[im 23/30]
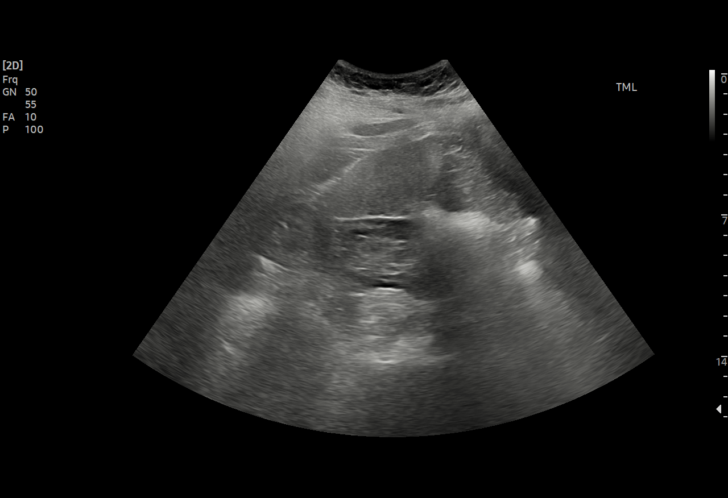
[im 25/30]
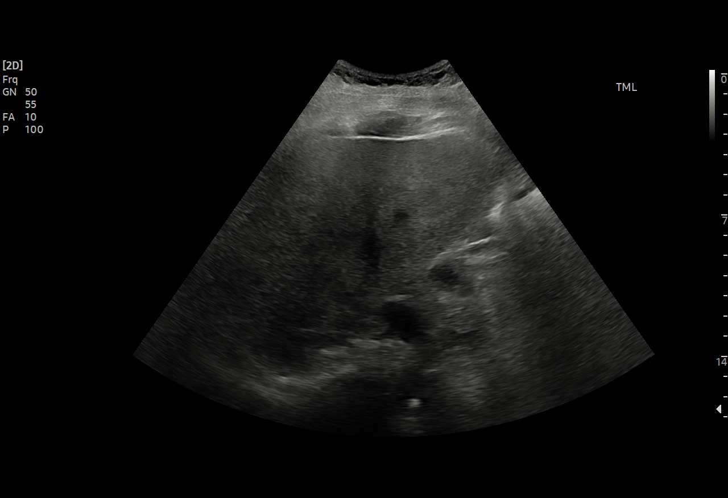
[im 27/30]
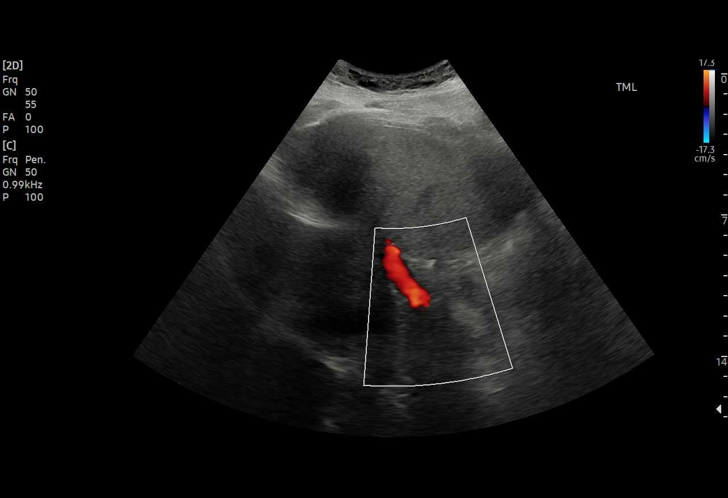
[im 30/30]
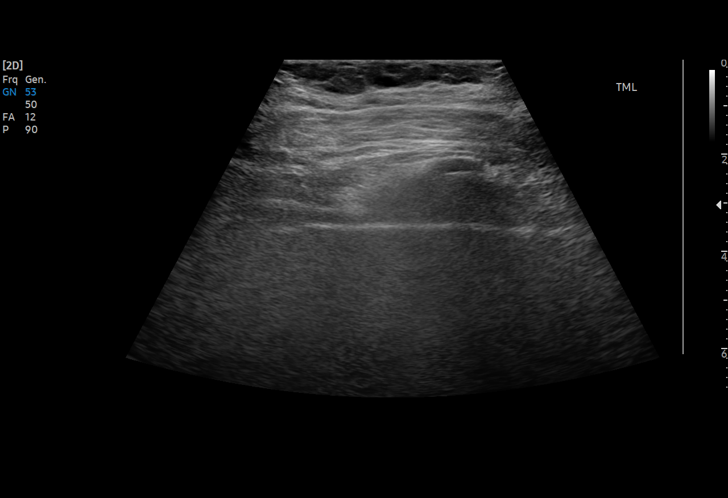

[15 of 25 positions shown; findings below may reference images not displayed]

FINDINGS: Gallbladder:

Not seen consistent with cholecystectomy.

Common bile duct:

Diameter: 5.3 mm

Liver:

There is increased echogenicity in the liver. No focal abnormality
is seen. Portal vein is patent on color Doppler imaging with normal
direction of blood flow towards the liver.

Other: No significant interval changes noted.
IMPRESSION: Fatty liver.  Status post cholecystectomy.

## 2022-04-23 IMAGING — DX DG FOOT COMPLETE 3+V*R*
3 series · 3 of 3 positions shown · non-contrast
Comparison: None.

CLINICAL DATA: Pain

EXAM:
RIGHT FOOT COMPLETE - 3+ VIEW

[foot supine dp]
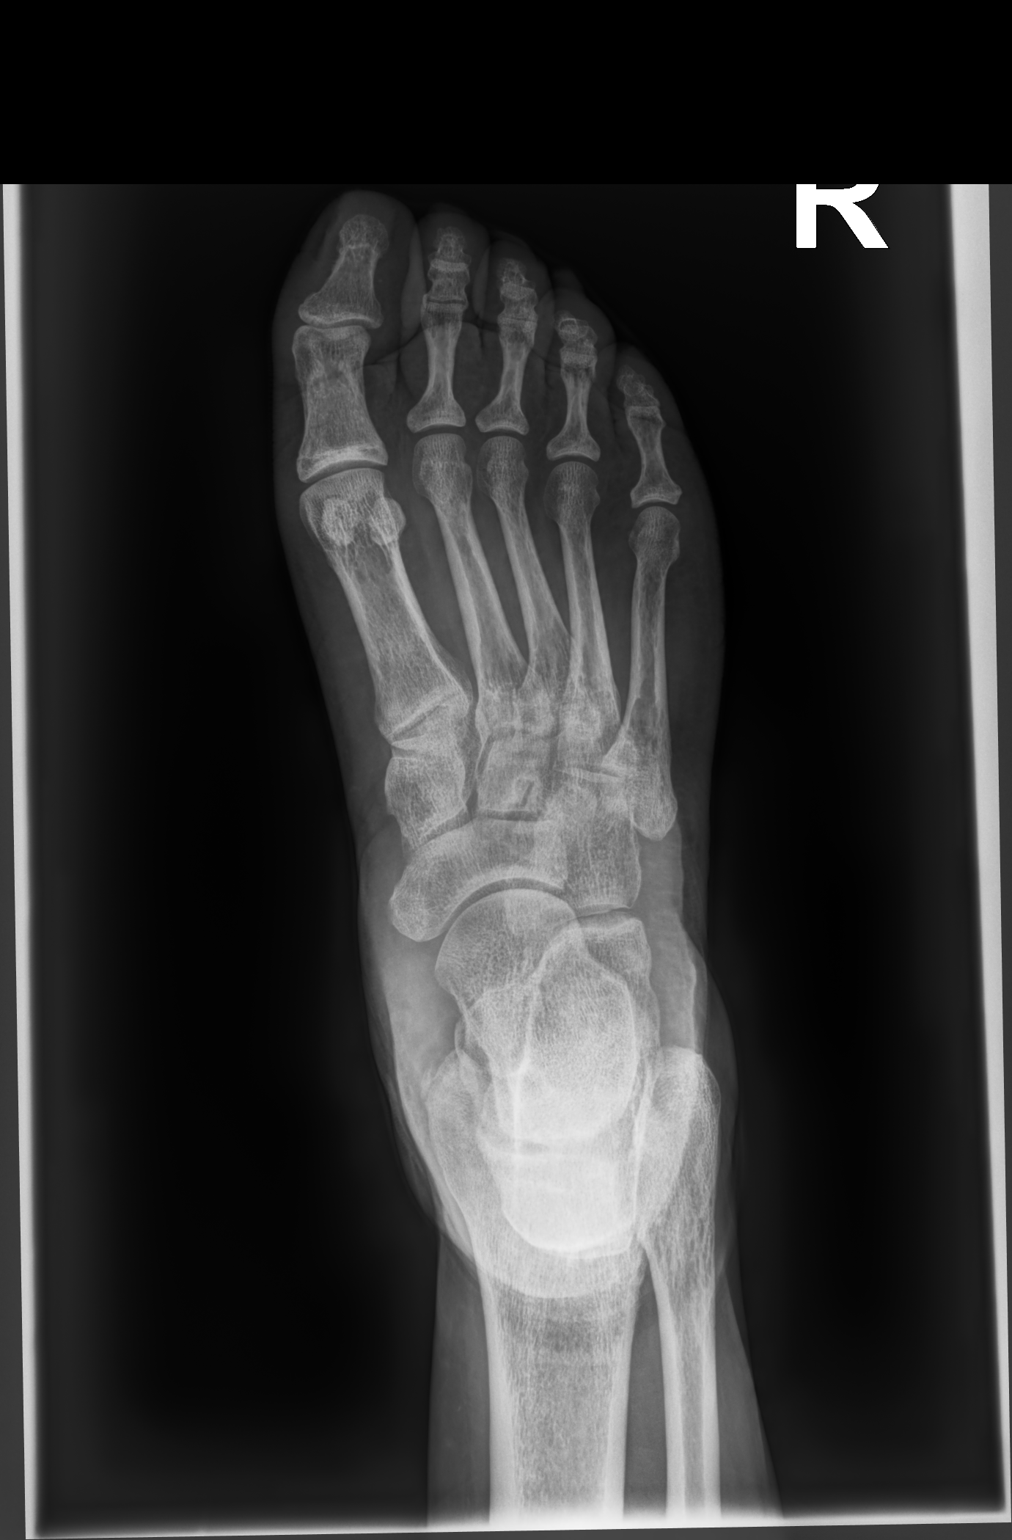

[foot medial oblique]
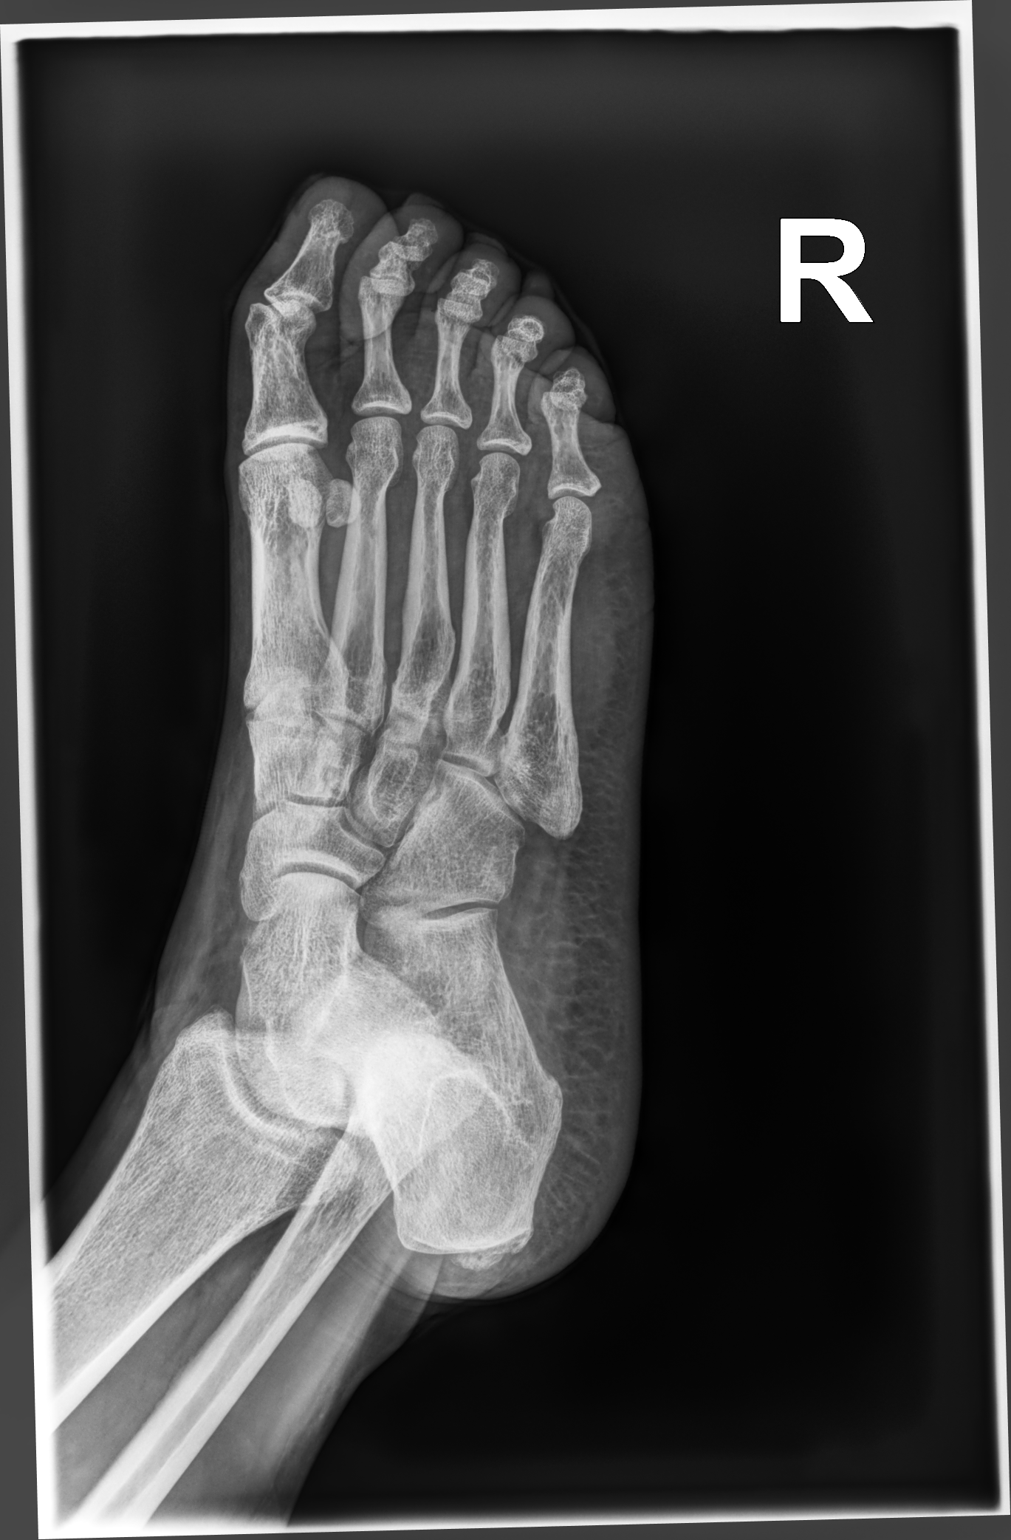

[foot supine lat]
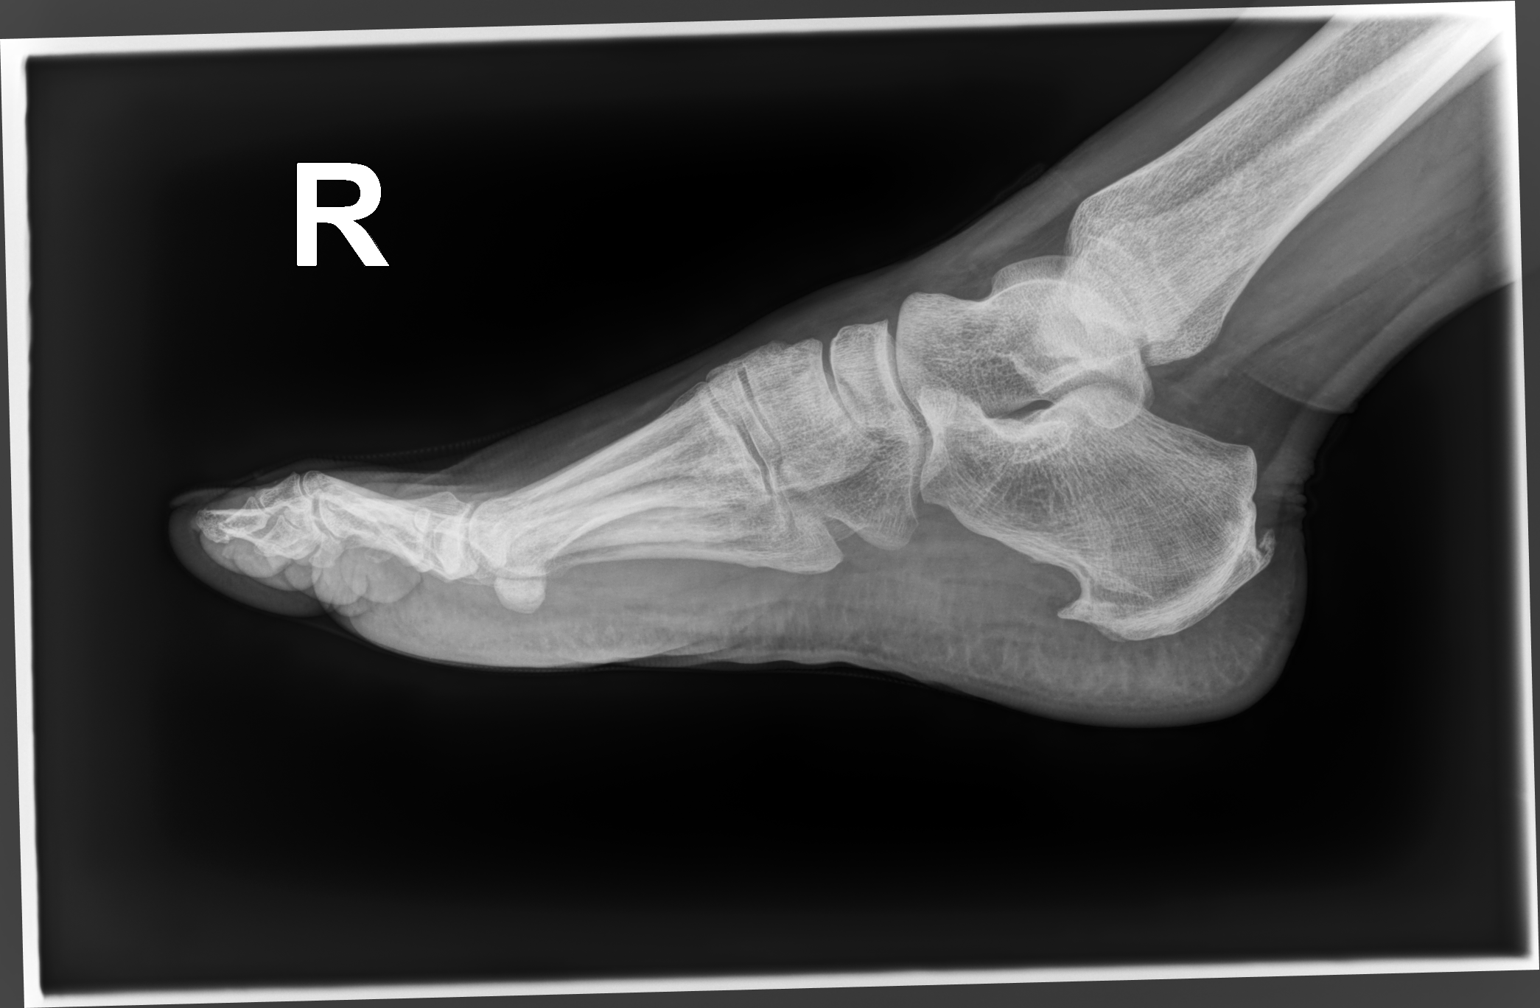

[3 of 3 positions shown; findings below may reference images not displayed]

FINDINGS: No fracture or dislocation is seen. Osteopenia is seen in bony
structures. No focal lytic lesions are seen. Plantar spur is seen in
calcaneus. There is coarse calcification at the attachment of
Achilles tendon to the calcaneus suggesting calcific tendinosis.
IMPRESSION: No fracture or dislocation is seen in the right foot.

## 2022-04-30 ENCOUNTER — Other Ambulatory Visit: Payer: Self-pay

## 2022-04-30 ENCOUNTER — Other Ambulatory Visit (HOSPITAL_COMMUNITY): Payer: Self-pay

## 2022-05-12 ENCOUNTER — Other Ambulatory Visit (HOSPITAL_COMMUNITY): Payer: Self-pay

## 2022-05-27 ENCOUNTER — Other Ambulatory Visit (HOSPITAL_COMMUNITY): Payer: Self-pay

## 2022-05-28 ENCOUNTER — Other Ambulatory Visit: Payer: Self-pay

## 2022-05-29 ENCOUNTER — Other Ambulatory Visit (HOSPITAL_COMMUNITY): Payer: Self-pay

## 2022-06-23 ENCOUNTER — Other Ambulatory Visit: Payer: Self-pay

## 2022-06-24 ENCOUNTER — Other Ambulatory Visit: Payer: Self-pay | Admitting: Internal Medicine

## 2022-06-24 ENCOUNTER — Other Ambulatory Visit: Payer: Self-pay

## 2022-06-24 ENCOUNTER — Other Ambulatory Visit (HOSPITAL_COMMUNITY): Payer: Self-pay

## 2022-06-24 MED ORDER — ZOLPIDEM TARTRATE ER 12.5 MG PO TBCR
12.5000 mg | EXTENDED_RELEASE_TABLET | Freq: Every evening | ORAL | 5 refills | Status: DC | PRN
  Filled 2022-06-24 – 2022-06-25 (×2): qty 30, 30d supply, fill #0
  Filled 2022-07-27: qty 30, 30d supply, fill #1
  Filled 2022-08-24: qty 30, 30d supply, fill #2
  Filled 2022-09-22: qty 30, 30d supply, fill #3
  Filled 2022-10-23: qty 30, 30d supply, fill #4
  Filled 2022-11-23: qty 30, 30d supply, fill #5

## 2022-06-25 ENCOUNTER — Other Ambulatory Visit: Payer: Self-pay

## 2022-06-25 ENCOUNTER — Encounter (HOSPITAL_COMMUNITY): Payer: Self-pay

## 2022-06-25 ENCOUNTER — Other Ambulatory Visit (HOSPITAL_COMMUNITY): Payer: Self-pay

## 2022-06-26 ENCOUNTER — Other Ambulatory Visit: Payer: Self-pay | Admitting: Gastroenterology

## 2022-06-26 ENCOUNTER — Other Ambulatory Visit (HOSPITAL_COMMUNITY): Payer: Self-pay

## 2022-06-26 MED ORDER — COLESEVELAM HCL 625 MG PO TABS
625.0000 mg | ORAL_TABLET | Freq: Three times a day (TID) | ORAL | 11 refills | Status: AC
  Filled 2022-06-26 – 2022-07-07 (×2): qty 90, 30d supply, fill #0
  Filled 2022-10-05: qty 90, 30d supply, fill #1
  Filled 2022-11-03: qty 90, 30d supply, fill #2
  Filled 2022-12-29: qty 90, 30d supply, fill #3
  Filled 2023-05-07: qty 90, 30d supply, fill #4

## 2022-07-02 ENCOUNTER — Other Ambulatory Visit: Payer: Self-pay

## 2022-07-02 ENCOUNTER — Other Ambulatory Visit (HOSPITAL_COMMUNITY): Payer: Self-pay

## 2022-07-07 ENCOUNTER — Other Ambulatory Visit (HOSPITAL_COMMUNITY): Payer: Self-pay

## 2022-07-22 ENCOUNTER — Other Ambulatory Visit (HOSPITAL_COMMUNITY): Payer: Self-pay

## 2022-07-24 ENCOUNTER — Other Ambulatory Visit (HOSPITAL_COMMUNITY): Payer: Self-pay

## 2022-07-27 ENCOUNTER — Other Ambulatory Visit (HOSPITAL_COMMUNITY): Payer: Self-pay

## 2022-07-28 ENCOUNTER — Other Ambulatory Visit: Payer: Self-pay

## 2022-07-28 ENCOUNTER — Ambulatory Visit: Payer: BC Managed Care – PPO | Admitting: Adult Health

## 2022-08-17 ENCOUNTER — Other Ambulatory Visit: Payer: Self-pay

## 2022-08-19 ENCOUNTER — Other Ambulatory Visit: Payer: Self-pay

## 2022-08-19 ENCOUNTER — Other Ambulatory Visit (HOSPITAL_COMMUNITY): Payer: Self-pay

## 2022-08-24 ENCOUNTER — Other Ambulatory Visit (HOSPITAL_COMMUNITY): Payer: Self-pay

## 2022-08-25 ENCOUNTER — Other Ambulatory Visit: Payer: Self-pay

## 2022-09-11 ENCOUNTER — Other Ambulatory Visit: Payer: Self-pay

## 2022-09-15 ENCOUNTER — Other Ambulatory Visit (HOSPITAL_COMMUNITY): Payer: Self-pay

## 2022-09-16 ENCOUNTER — Other Ambulatory Visit (HOSPITAL_COMMUNITY): Payer: Self-pay

## 2022-09-22 ENCOUNTER — Other Ambulatory Visit (HOSPITAL_BASED_OUTPATIENT_CLINIC_OR_DEPARTMENT_OTHER): Payer: Self-pay

## 2022-09-22 ENCOUNTER — Other Ambulatory Visit (HOSPITAL_COMMUNITY): Payer: Self-pay

## 2022-09-23 ENCOUNTER — Other Ambulatory Visit: Payer: Self-pay

## 2022-10-05 ENCOUNTER — Other Ambulatory Visit: Payer: Self-pay

## 2022-10-05 ENCOUNTER — Other Ambulatory Visit (HOSPITAL_COMMUNITY): Payer: Self-pay

## 2022-10-05 ENCOUNTER — Other Ambulatory Visit: Payer: Self-pay | Admitting: Gastroenterology

## 2022-10-05 MED ORDER — OMEPRAZOLE 40 MG PO CPDR
40.0000 mg | DELAYED_RELEASE_CAPSULE | Freq: Every day | ORAL | 2 refills | Status: AC
  Filled 2022-10-05: qty 90, 90d supply, fill #0
  Filled 2022-12-29: qty 90, 90d supply, fill #1
  Filled 2023-05-07: qty 90, 90d supply, fill #2

## 2022-10-07 ENCOUNTER — Other Ambulatory Visit (HOSPITAL_COMMUNITY): Payer: Self-pay

## 2022-10-08 ENCOUNTER — Other Ambulatory Visit: Payer: Self-pay | Admitting: Pharmacist

## 2022-10-08 NOTE — Progress Notes (Signed)
Patient ID: Amy Hernandez, female   DOB: November 30, 1965, 57 y.o.   MRN: [id number]  Patient appearing on report for [employer] [facility] - Hypertension Control report due to last documented ambulatory blood pressure of 128/98 on 01/29/2022. Next appointment with PCP is not scheduled.       Outreached patient to discuss hypertension control and medication management.   Current antihypertensives: lisinopril/hydrochlorothiazide 20/25 mg daily  Patient has an automated upper arm home BP machine.Pt reports she has not checked BP recently. She will plan to check today, will call back tomorrow for home BP reading.  Current blood pressure readings: 117/79 today  Patient reports hypotensive signs and symptoms including dizziness, lightheadedness when bending down.  Patient denies hypertensive symptoms including headache, chest pain, shortness of breath.   Assessment/Plan: - Currently controlled,  - - Reviewed goal blood pressure <130/80 - Reviewed to check blood pressure about once per month, document, and provide at next provider visit  Arbutus Leas, PharmD, BCPS [facility] [phone]

## 2022-10-09 ENCOUNTER — Other Ambulatory Visit (HOSPITAL_COMMUNITY): Payer: Self-pay

## 2022-10-13 ENCOUNTER — Other Ambulatory Visit (HOSPITAL_COMMUNITY): Payer: Self-pay

## 2022-10-23 ENCOUNTER — Other Ambulatory Visit (HOSPITAL_COMMUNITY): Payer: Self-pay

## 2022-10-23 ENCOUNTER — Other Ambulatory Visit: Payer: Self-pay

## 2022-11-03 ENCOUNTER — Other Ambulatory Visit (HOSPITAL_COMMUNITY): Payer: Self-pay

## 2022-11-12 ENCOUNTER — Ambulatory Visit: Payer: BC Managed Care – PPO | Admitting: Infectious Diseases

## 2022-11-16 ENCOUNTER — Other Ambulatory Visit: Payer: Self-pay

## 2022-11-16 ENCOUNTER — Ambulatory Visit (INDEPENDENT_AMBULATORY_CARE_PROVIDER_SITE_OTHER): Payer: BC Managed Care – PPO | Admitting: Infectious Diseases

## 2022-11-16 ENCOUNTER — Other Ambulatory Visit (HOSPITAL_COMMUNITY): Payer: Self-pay

## 2022-11-16 ENCOUNTER — Encounter: Payer: Self-pay | Admitting: Infectious Diseases

## 2022-11-16 VITALS — BP 96/67 | HR 89 | Resp 16 | Ht 64.0 in | Wt 184.0 lb

## 2022-11-16 DIAGNOSIS — Z23 Encounter for immunization: Secondary | ICD-10-CM | POA: Diagnosis not present

## 2022-11-16 DIAGNOSIS — E782 Mixed hyperlipidemia: Secondary | ICD-10-CM

## 2022-11-16 DIAGNOSIS — B20 Human immunodeficiency virus [HIV] disease: Secondary | ICD-10-CM

## 2022-11-16 MED ORDER — BIKTARVY 50-200-25 MG PO TABS
ORAL_TABLET | ORAL | 11 refills | Status: DC
  Filled 2022-11-16: qty 30, fill #0
  Filled 2022-12-02: qty 30, 30d supply, fill #0
  Filled 2022-12-18: qty 30, 30d supply, fill #1
  Filled 2023-01-18: qty 30, 30d supply, fill #2
  Filled 2023-02-17: qty 30, 30d supply, fill #3
  Filled 2023-03-23: qty 30, 30d supply, fill #4
  Filled 2023-04-21: qty 30, 30d supply, fill #5
  Filled 2023-05-24: qty 30, 30d supply, fill #6
  Filled 2023-06-18: qty 30, 30d supply, fill #7

## 2022-11-16 NOTE — Patient Instructions (Signed)
So lovely to see you!   Flu shot today   COVID vaccine has been shown to reduce long covid symptoms from more recent research, but acute COVID infection largely now is non-severe for many people.    Return in about 9 months (around 08/16/2023).

## 2022-11-16 NOTE — Assessment & Plan Note (Addendum)
Very well controlled on once daily biktarvy. No concerns with access or adherence to medication. They are tolerating the medication well without side effects. No drug interactions identified. Pertinent lab tests ordered today.  No changes to insurance coverage.  No dental needs today.  No concern over anxious/depressed mood.  Sexual health and family planning discussed - pap smear normal cytology (-) HPV in 2022 with GYN team. Next due in 2025. Mammogram due soon - see above discussion about concerns to defer for 6-66m. Will d/w Dr. Okey Dupre at upcoming appointment.  Smoking decreased - would love to see her quit.  Statin rx ongoing.  Diabetes Screening Jan 2024 A1C < 6.4%  Flu shot provided today. She would like to defer COVID booster.   Return in about 9 months (around 08/16/2023).

## 2022-11-16 NOTE — Progress Notes (Signed)
Patient Name: Amy Hernandez  Date of Birth: [birth year] MRN: [id number]  PCP: Myrlene Broker, MD     SUBJECTIVE:  Brief Narrative:  Amy Hernandez is a 57 y.o. female with well controlled HIV on Biktarvy. HIV Risk: sexual OI Hx: none CD4 nadir > 200 by report  Previous Regimens:  Atripla - well controlled but required switch d/t insurance  Odefsey - did not like s/e or food requirement  Biktarvy 06/2017 - suppressed   Genotype:  sensitive     CC:  Chief Complaint  Patient presents with   Follow-up    Declines covid vaccine  Agrees with Flu shot     HPI: Here for routine yearly follow up.  We spent time catching up since our last visit together. Daughter is getting ready to move into her own apartment with 8 yo son. [name] continues to go well. No concerns with side effects or access through pharmacy. Not interested in switching medication as she tolerates it well and continues to do well.   She has mammogram due soon - she has had to have a biopsy of bilateral breasts several years in a row. The most recent one on the left became infected and was very uncomfortable and deep. Planning to discuss with Dr. Okey Dupre more at annual visit coming up soon.   pap smear in 2022 was normal   Cigarette smoking is improving slowly - 5 packs lasts 2 weeks now.    Review of Systems  Constitutional:  Negative for chills, fever, malaise/fatigue and weight loss.  HENT:  Negative for sore throat.   Respiratory:  Negative for cough, sputum production and shortness of breath.   Cardiovascular: Negative.   Gastrointestinal:  Negative for abdominal pain, diarrhea and vomiting.  Musculoskeletal:  Negative for joint pain, myalgias and neck pain.  Skin:  Negative for rash.  Neurological:  Negative for headaches.  Psychiatric/Behavioral:  Negative for depression and substance abuse. The patient is not nervous/anxious.      Past Medical History:  Diagnosis Date   Barrett's  esophagus - short segment 09/04/2016   Bartholin gland cyst    Breast cancer (HCC)    Cervical intraepithelial neoplasia (CIN) 2005   high grade cervical dysplasia prior to total hysterectomy - needs yearly vaginal paps    GERD (gastroesophageal reflux disease)    Granular cell tumor - distal esophagus - smal 09/04/2016   HIV (human immunodeficiency virus infection) (HCC)    Hyperlipidemia    Hypertension    IBS (irritable bowel syndrome)    Insomnia    Substance abuse (HCC)     Outpatient Medications Prior to Visit  Medication Sig Dispense Refill   albuterol (VENTOLIN HFA) 108 (90 Base) MCG/ACT inhaler Inhale 1 puff into the lungs every 6 (six) hours as needed for wheezing or shortness of breath. 8.5 g 3   atorvastatin (LIPITOR) 40 MG tablet Take 1 tablet (40 mg total) by mouth daily. 90 tablet 3   buPROPion (WELLBUTRIN XL) 150 MG 24 hr tablet Take 1 tablet (150 mg total) by mouth daily. 30 tablet 5   colesevelam (WELCHOL) 625 MG tablet Take 1 tablet by mouth 3 times daily. 90 tablet 11   lisinopril-hydrochlorothiazide (ZESTORETIC) 20-25 MG tablet Take 1 tablet by mouth daily. 90 tablet 3   omeprazole (PRILOSEC) 40 MG capsule Take 1 capsule (40 mg total) by mouth daily. 90 capsule 2   ondansetron (ZOFRAN-ODT) 4 MG disintegrating tablet Dissolve 1 tablet (4 mg total) by  mouth every 8 (eight) hours as needed for nausea or vomiting. 20 tablet 0   potassium chloride SA (KLOR-CON M) 20 MEQ tablet Take 1 tablet (20 mEq total) by mouth daily. 90 tablet 3   zolpidem (AMBIEN CR) 12.5 MG CR tablet Take 1 tablet (12.5 mg total) by mouth at bedtime as needed for sleep. 30 tablet 5   bictegravir-emtricitabine-tenofovir AF (BIKTARVY) 50-200-25 MG TABS tablet TAKE 1 TABLET BY MOUTH DAILY. TRY TO TAKE AT THE SAME TIME EACH DAY WITH OR WITHOUT FOOD. 30 tablet 11   No facility-administered medications prior to visit.    Allergies  Allergen Reactions   Codeine     REACTION: hallucinations     Physical Assessment & Diagnostic Findings: Vitals:   11/16/22 0847  BP: 96/67  Pulse: 89  Resp: 16  Weight: 184 lb (83.5 kg)  Height: 5\' 4"  (1.626 m)   Body mass index is 31.58 kg/m.   Physical Exam HENT:     Mouth/Throat:     Mouth: No oral lesions.     Dentition: No dental abscesses.  Cardiovascular:     Rate and Rhythm: Normal rate and regular rhythm.     Heart sounds: Normal heart sounds.  Pulmonary:     Effort: Pulmonary effort is normal.     Breath sounds: Normal breath sounds.  Abdominal:     General: There is no distension.     Palpations: Abdomen is soft.     Tenderness: There is no abdominal tenderness.  Musculoskeletal:        General: No tenderness. Normal range of motion.  Lymphadenopathy:     Cervical: No cervical adenopathy.  Skin:    General: Skin is warm and dry.     Findings: No rash.  Neurological:     Mental Status: She is alert and oriented to person, place, and time.  Psychiatric:        Judgment: Judgment normal.    Deferred as our visit was spent in direct counseling    Lab Results Lab Results  Component Value Date   CREATININE 1.10 01/29/2022   BUN 11 01/29/2022   NA 137 01/29/2022   K 3.6 01/29/2022   CL 99 01/29/2022   CO2 32 01/29/2022    Lab Results  Component Value Date   ALT 34 01/29/2022   AST 42 (H) 01/29/2022   ALKPHOS 89 01/29/2022   BILITOT 0.3 01/29/2022    Lab Results  Component Value Date   CHOL 233 (H) 01/29/2022   HDL 45.90 01/29/2022   LDLCALC 137 (H) 11/11/2021   LDLDIRECT 144.0 01/29/2022   TRIG 298.0 (H) 01/29/2022   CHOLHDL 5 01/29/2022   HIV 1 RNA Quant (Copies/mL)  Date Value  11/11/2021 Not Detected  11/12/2020 Not Detected  05/07/2020 Not Detected   CD4 T Cell Abs (/uL)  Date Value  11/11/2021 969  11/12/2020 1,224  05/07/2020 1,243   Problem List Items Addressed This Visit       Unprioritized   Human immunodeficiency virus (HIV) disease (HCC) (Chronic)    Very well  controlled on once daily biktarvy. No concerns with access or adherence to medication. They are tolerating the medication well without side effects. No drug interactions identified. Pertinent lab tests ordered today.  No changes to insurance coverage.  No dental needs today.  No concern over anxious/depressed mood.  Sexual health and family planning discussed - pap smear normal cytology (-) HPV in 2022 with GYN team. Next due in 2025. Mammogram  due soon - see above discussion about concerns to defer for 6-12m. Will d/w Dr. Okey Dupre at upcoming appointment.  Smoking decreased - would love to see her quit.  Statin rx ongoing.  Diabetes Screening Jan 2024 A1C < 6.4%  Flu shot provided today. She would like to defer COVID booster.   Return in about 9 months (around 08/16/2023).        Relevant Medications   bictegravir-emtricitabine-tenofovir AF (BIKTARVY) 50-200-25 MG TABS tablet   Other Relevant Orders   HIV 1 RNA quant-no reflex-bld   T-helper cells (CD4) count   HLD (hyperlipidemia) - Primary    With new research coming out by way of the REPRIEVE study regarding cardiovascular event risk reduction for PLWH, I discussed that over the 8 year trial initiation of statin (pitavastatin) therapy was shown to reduce CV disease by 35% independent of other risk factors.    The 10-year ASCVD risk score (Arnett DK, et al., 2019) is: 4.9%   Values used to calculate the score:     Age: 44 years     Sex: Female     Is Non-Hispanic African American: Yes     Diabetic: No     Tobacco smoker: Yes     Systolic Blood Pressure: 96 mmHg     Is BP treated: Yes     HDL Cholesterol: 45.9 mg/dL     Total Cholesterol: 233 mg/dL   We discussed the patient's 10-year CVD Risk Score to be at least moderately elevated, and would benefit from intervention given HIV+ and > 74 yo.   Dr. Okey Dupre started her on statin - discussed this will be helpful to reduce cardiac morbidity overall.         Rexene Alberts,  MSN, NP-C [facility]  [email or web address] Pager: [phone] Office: [phone] [facility] Main Line: [phone]

## 2022-11-16 NOTE — Assessment & Plan Note (Signed)
With new research coming out by way of the REPRIEVE study regarding cardiovascular event risk reduction for PLWH, I discussed that over the 8 year trial initiation of statin (pitavastatin) therapy was shown to reduce CV disease by 35% independent of other risk factors.    The 10-year ASCVD risk score (Arnett DK, et al., 2019) is: 4.9%   Values used to calculate the score:     Age: 57 years     Sex: Female     Is Non-Hispanic African American: Yes     Diabetic: No     Tobacco smoker: Yes     Systolic Blood Pressure: 96 mmHg     Is BP treated: Yes     HDL Cholesterol: 45.9 mg/dL     Total Cholesterol: 233 mg/dL   We discussed the patient's 10-year CVD Risk Score to be at least moderately elevated, and would benefit from intervention given HIV+ and > 27 yo.   Dr. Okey Dupre started her on statin - discussed this will be helpful to reduce cardiac morbidity overall.

## 2022-11-17 LAB — T-HELPER CELLS (CD4) COUNT (NOT AT ARMC)
CD4 % Helper T Cell: 36 % (ref 33–65)
CD4 T Cell Abs: 1354 /uL (ref 400–1790)

## 2022-11-18 LAB — HIV-1 RNA QUANT-NO REFLEX-BLD
HIV 1 RNA Quant: NOT DETECTED {copies}/mL
HIV-1 RNA Quant, Log: NOT DETECTED {Log_copies}/mL

## 2022-11-23 ENCOUNTER — Other Ambulatory Visit (HOSPITAL_COMMUNITY): Payer: Self-pay

## 2022-11-25 ENCOUNTER — Other Ambulatory Visit: Payer: Self-pay

## 2022-11-27 ENCOUNTER — Other Ambulatory Visit: Payer: Self-pay

## 2022-11-30 ENCOUNTER — Other Ambulatory Visit (HOSPITAL_COMMUNITY): Payer: Self-pay

## 2022-12-02 ENCOUNTER — Other Ambulatory Visit: Payer: Self-pay

## 2022-12-02 NOTE — Progress Notes (Signed)
Specialty Pharmacy Refill Coordination Note  Amy Hernandez is a 57 y.o. female contacted today regarding refills of specialty medication(s) Bictegravir-Emtricitab-Tenofov   Patient requested Delivery   Delivery date: 12/03/22   Verified address: [address] [name] [address]   Medication will be filled on 12/02/22.

## 2022-12-18 ENCOUNTER — Other Ambulatory Visit: Payer: Self-pay

## 2022-12-18 NOTE — Progress Notes (Signed)
Specialty Pharmacy Refill Coordination Note  Amy Hernandez is a 57 y.o. female contacted today regarding refills of specialty medication(s) Bictegravir-Emtricitab-Tenofov   Patient requested Delivery   Delivery date: 12/29/22   Verified address: [address] [name] [address]   Medication will be filled on 12/28/22.

## 2022-12-23 ENCOUNTER — Other Ambulatory Visit: Payer: Self-pay

## 2022-12-23 ENCOUNTER — Other Ambulatory Visit (HOSPITAL_COMMUNITY): Payer: Self-pay

## 2022-12-23 ENCOUNTER — Other Ambulatory Visit: Payer: Self-pay | Admitting: Internal Medicine

## 2022-12-23 MED ORDER — ZOLPIDEM TARTRATE ER 12.5 MG PO TBCR
12.5000 mg | EXTENDED_RELEASE_TABLET | Freq: Every evening | ORAL | 1 refills | Status: DC | PRN
  Filled 2022-12-23: qty 30, 30d supply, fill #0
  Filled 2023-01-21: qty 30, 30d supply, fill #1

## 2022-12-28 ENCOUNTER — Other Ambulatory Visit: Payer: Self-pay

## 2022-12-29 ENCOUNTER — Other Ambulatory Visit: Payer: Self-pay

## 2022-12-29 ENCOUNTER — Other Ambulatory Visit: Payer: Self-pay | Admitting: Internal Medicine

## 2022-12-29 ENCOUNTER — Other Ambulatory Visit: Payer: Self-pay | Admitting: Neurology

## 2022-12-29 DIAGNOSIS — I10 Essential (primary) hypertension: Secondary | ICD-10-CM

## 2022-12-29 DIAGNOSIS — K580 Irritable bowel syndrome with diarrhea: Secondary | ICD-10-CM

## 2023-01-01 ENCOUNTER — Other Ambulatory Visit: Payer: Self-pay

## 2023-01-06 ENCOUNTER — Other Ambulatory Visit (HOSPITAL_COMMUNITY): Payer: Self-pay

## 2023-01-06 ENCOUNTER — Other Ambulatory Visit: Payer: Self-pay | Admitting: Internal Medicine

## 2023-01-06 DIAGNOSIS — I10 Essential (primary) hypertension: Secondary | ICD-10-CM

## 2023-01-07 ENCOUNTER — Other Ambulatory Visit (HOSPITAL_COMMUNITY): Payer: Self-pay

## 2023-01-07 ENCOUNTER — Other Ambulatory Visit: Payer: Self-pay

## 2023-01-07 ENCOUNTER — Telehealth: Payer: Self-pay | Admitting: Internal Medicine

## 2023-01-07 DIAGNOSIS — I10 Essential (primary) hypertension: Secondary | ICD-10-CM

## 2023-01-07 MED ORDER — LISINOPRIL-HYDROCHLOROTHIAZIDE 20-25 MG PO TABS
1.0000 | ORAL_TABLET | Freq: Every day | ORAL | 3 refills | Status: DC
  Filled 2023-01-07: qty 90, 90d supply, fill #0

## 2023-01-07 NOTE — Telephone Encounter (Signed)
Done

## 2023-01-07 NOTE — Telephone Encounter (Signed)
Next OV is 02/01/2023.   Prescription Request  01/07/2023  LOV: 01/29/2022  What is the name of the medication or equipment?  lisinopril-hydrochlorothiazide (ZESTORETIC) 20-25 MG tablet  Have you contacted your pharmacy to request a refill? Yes   Which pharmacy would you like this sent to?  [place] [facility] [address] [place] [address] Phone: [phone] Fax: [phone]    Patient notified that their request is being sent to the clinical staff for review and that they should receive a response within 2 business days.   Please advise at Mobile [phone] (mobile)

## 2023-01-17 ENCOUNTER — Telehealth: Payer: BC Managed Care – PPO

## 2023-01-17 DIAGNOSIS — M25521 Pain in right elbow: Secondary | ICD-10-CM | POA: Diagnosis not present

## 2023-01-18 ENCOUNTER — Other Ambulatory Visit: Payer: Self-pay

## 2023-01-18 ENCOUNTER — Other Ambulatory Visit (HOSPITAL_COMMUNITY): Payer: Self-pay

## 2023-01-18 ENCOUNTER — Other Ambulatory Visit (HOSPITAL_COMMUNITY): Payer: Self-pay | Admitting: Pharmacy Technician

## 2023-01-18 MED ORDER — POTASSIUM CHLORIDE CRYS ER 20 MEQ PO TBCR
20.0000 meq | EXTENDED_RELEASE_TABLET | Freq: Every day | ORAL | 3 refills | Status: DC
  Filled 2023-01-18: qty 90, 90d supply, fill #0

## 2023-01-18 NOTE — Progress Notes (Signed)
Specialty Pharmacy Refill Coordination Note  Amy Hernandez is a 57 y.o. female contacted today regarding refills of specialty medication(s) Bictegravir-Emtricitab-Tenofov [name])   Patient requested Delivery   Delivery date: 01/26/23   Verified address: [address]  [name]   Medication will be filled on 01/25/23.

## 2023-01-21 ENCOUNTER — Other Ambulatory Visit (HOSPITAL_COMMUNITY): Payer: Self-pay

## 2023-01-21 ENCOUNTER — Other Ambulatory Visit: Payer: Self-pay

## 2023-01-25 ENCOUNTER — Other Ambulatory Visit: Payer: Self-pay

## 2023-01-25 DIAGNOSIS — M7021 Olecranon bursitis, right elbow: Secondary | ICD-10-CM | POA: Diagnosis not present

## 2023-02-01 ENCOUNTER — Ambulatory Visit (INDEPENDENT_AMBULATORY_CARE_PROVIDER_SITE_OTHER): Payer: BC Managed Care – PPO

## 2023-02-01 ENCOUNTER — Other Ambulatory Visit (HOSPITAL_COMMUNITY): Payer: Self-pay

## 2023-02-01 ENCOUNTER — Encounter: Payer: Self-pay | Admitting: Internal Medicine

## 2023-02-01 ENCOUNTER — Telehealth: Payer: Self-pay

## 2023-02-01 ENCOUNTER — Ambulatory Visit: Payer: BC Managed Care – PPO | Admitting: Internal Medicine

## 2023-02-01 VITALS — BP 114/84 | HR 88 | Temp 98.7°F | Ht 64.0 in | Wt 181.0 lb

## 2023-02-01 DIAGNOSIS — K580 Irritable bowel syndrome with diarrhea: Secondary | ICD-10-CM | POA: Diagnosis not present

## 2023-02-01 DIAGNOSIS — M79671 Pain in right foot: Secondary | ICD-10-CM | POA: Insufficient documentation

## 2023-02-01 DIAGNOSIS — R7303 Prediabetes: Secondary | ICD-10-CM

## 2023-02-01 DIAGNOSIS — I10 Essential (primary) hypertension: Secondary | ICD-10-CM

## 2023-02-01 DIAGNOSIS — Z0001 Encounter for general adult medical examination with abnormal findings: Secondary | ICD-10-CM

## 2023-02-01 DIAGNOSIS — E782 Mixed hyperlipidemia: Secondary | ICD-10-CM

## 2023-02-01 DIAGNOSIS — M7989 Other specified soft tissue disorders: Secondary | ICD-10-CM | POA: Diagnosis not present

## 2023-02-01 DIAGNOSIS — B20 Human immunodeficiency virus [HIV] disease: Secondary | ICD-10-CM

## 2023-02-01 DIAGNOSIS — M7731 Calcaneal spur, right foot: Secondary | ICD-10-CM | POA: Diagnosis not present

## 2023-02-01 DIAGNOSIS — F3341 Major depressive disorder, recurrent, in partial remission: Secondary | ICD-10-CM

## 2023-02-01 DIAGNOSIS — M25571 Pain in right ankle and joints of right foot: Secondary | ICD-10-CM | POA: Diagnosis not present

## 2023-02-01 DIAGNOSIS — M25521 Pain in right elbow: Secondary | ICD-10-CM | POA: Diagnosis not present

## 2023-02-01 DIAGNOSIS — Z0000 Encounter for general adult medical examination without abnormal findings: Secondary | ICD-10-CM

## 2023-02-01 DIAGNOSIS — K219 Gastro-esophageal reflux disease without esophagitis: Secondary | ICD-10-CM

## 2023-02-01 DIAGNOSIS — F5104 Psychophysiologic insomnia: Secondary | ICD-10-CM

## 2023-02-01 DIAGNOSIS — F17200 Nicotine dependence, unspecified, uncomplicated: Secondary | ICD-10-CM

## 2023-02-01 DIAGNOSIS — G4733 Obstructive sleep apnea (adult) (pediatric): Secondary | ICD-10-CM

## 2023-02-01 LAB — CBC
HCT: 38.2 % (ref 36.0–46.0)
Hemoglobin: 13.3 g/dL (ref 12.0–15.0)
MCHC: 34.8 g/dL (ref 30.0–36.0)
MCV: 95 fL (ref 78.0–100.0)
Platelets: 365 10*3/uL (ref 150.0–400.0)
RBC: 4.02 Mil/uL (ref 3.87–5.11)
RDW: 13.2 % (ref 11.5–15.5)
WBC: 10.3 10*3/uL (ref 4.0–10.5)

## 2023-02-01 LAB — COMPREHENSIVE METABOLIC PANEL
ALT: 9 U/L (ref 0–35)
AST: 23 U/L (ref 0–37)
Albumin: 4.4 g/dL (ref 3.5–5.2)
Alkaline Phosphatase: 119 U/L — ABNORMAL HIGH (ref 39–117)
BUN: 17 mg/dL (ref 6–23)
CO2: 30 meq/L (ref 19–32)
Calcium: 9.5 mg/dL (ref 8.4–10.5)
Chloride: 92 meq/L — ABNORMAL LOW (ref 96–112)
Creatinine, Ser: 1.3 mg/dL — ABNORMAL HIGH (ref 0.40–1.20)
GFR: 46.19 mL/min — ABNORMAL LOW (ref >60.00)
Glucose, Bld: 111 mg/dL — ABNORMAL HIGH (ref 70–99)
Potassium: 2.6 meq/L — CL (ref 3.5–5.1)
Sodium: 137 meq/L (ref 135–145)
Total Bilirubin: 0.3 mg/dL (ref 0.2–1.2)
Total Protein: 8.2 g/dL (ref 6.0–8.3)

## 2023-02-01 LAB — LIPID PANEL
Cholesterol: 170 mg/dL (ref 0–200)
HDL: 39.6 mg/dL (ref >39.00)
LDL Cholesterol: 84 mg/dL (ref 0–99)
NonHDL: 130.82
Total CHOL/HDL Ratio: 4
Triglycerides: 234 mg/dL — ABNORMAL HIGH (ref 0.0–149.0)
VLDL: 46.8 mg/dL — ABNORMAL HIGH (ref 0.0–40.0)

## 2023-02-01 LAB — HEMOGLOBIN A1C: Hgb A1c MFr Bld: 5.9 % (ref 4.6–6.5)

## 2023-02-01 MED ORDER — MELOXICAM 15 MG PO TABS
15.0000 mg | ORAL_TABLET | Freq: Every day | ORAL | 0 refills | Status: DC
  Filled 2023-02-01: qty 30, 30d supply, fill #0

## 2023-02-01 MED ORDER — POTASSIUM CHLORIDE CRYS ER 20 MEQ PO TBCR
20.0000 meq | EXTENDED_RELEASE_TABLET | Freq: Every day | ORAL | 3 refills | Status: AC
Start: 2023-02-01 — End: ?
  Filled 2023-02-01 – 2023-05-07 (×2): qty 90, 90d supply, fill #0
  Filled 2023-10-12: qty 90, 90d supply, fill #1
  Filled 2024-01-14: qty 90, 90d supply, fill #2

## 2023-02-01 MED ORDER — ATORVASTATIN CALCIUM 40 MG PO TABS
40.0000 mg | ORAL_TABLET | Freq: Every day | ORAL | 3 refills | Status: DC
  Filled 2023-02-01: qty 90, 90d supply, fill #0
  Filled 2023-05-07: qty 90, 90d supply, fill #1
  Filled 2023-08-19: qty 90, 90d supply, fill #2
  Filled 2023-11-18: qty 90, 90d supply, fill #3

## 2023-02-01 MED ORDER — LISINOPRIL-HYDROCHLOROTHIAZIDE 20-25 MG PO TABS
1.0000 | ORAL_TABLET | Freq: Every day | ORAL | 3 refills | Status: AC
Start: 2023-02-01 — End: ?
  Filled 2023-02-01 – 2023-04-12 (×2): qty 90, 90d supply, fill #0
  Filled 2023-07-13: qty 90, 90d supply, fill #1
  Filled 2023-10-12: qty 90, 90d supply, fill #2
  Filled 2024-01-14: qty 90, 90d supply, fill #3

## 2023-02-01 NOTE — Patient Instructions (Signed)
 We have sent in meloxicam to help the foot and will get x-rays today.

## 2023-02-01 NOTE — Telephone Encounter (Signed)
 Patient states that she has been skipping days and not taking her medicine like she is suppose to

## 2023-02-01 NOTE — Assessment & Plan Note (Signed)
 Checking lipid panel and no repeat since change from simvastatin to lipitor 40 mg daily. Adjust as needed.

## 2023-02-01 NOTE — Assessment & Plan Note (Signed)
 Flu shot up to date. Shingrix declines counseled. Tetanus up to date. Colonoscopy up to date. Mammogram up to date, pap smear up to date. Counseled about sun safety and mole surveillance. Counseled about the dangers of distracted driving. Given 10 year screening recommendations.

## 2023-02-01 NOTE — Telephone Encounter (Signed)
 Potassium is very low. Are you taking potassium lately? If so we need to increase dose and if not we need to treat this and resume your daily potassium. Please let me know dose she is taking daily if taking.

## 2023-02-01 NOTE — Assessment & Plan Note (Signed)
 Using ambien 12.5 mg cr daily and will continue.

## 2023-02-01 NOTE — Assessment & Plan Note (Signed)
 Seeing [facility] and last labs stable on biktarvy.

## 2023-02-01 NOTE — Assessment & Plan Note (Signed)
 Checking x-ray foot and ankle to rule out fracture. Unclear history as she stated she twisted ankle in fall then states she did not so no clear injury. Foot started hurting this weekend. Mild swelling on exam and pain in most of the foot. Rx meloxicam  and advised RICE. Prior x-ray with calcific tendonitis but pain today is not focused on achilles.

## 2023-02-01 NOTE — Assessment & Plan Note (Signed)
 Using will continue.

## 2023-02-01 NOTE — Assessment & Plan Note (Signed)
 Reviewed orthopedic note and history with patient. She is now back to typing today without pain or worsening swelling. Still some tenderness and is unable to wear the compression sleeve due to this.

## 2023-02-01 NOTE — Telephone Encounter (Signed)
 She needs to take K dur 20 mEq (her normal medicine) twice a day for 5 days then resume 1 daily after that.

## 2023-02-01 NOTE — Assessment & Plan Note (Signed)
 Checking Hga1c and adjust as needed.

## 2023-02-01 NOTE — Assessment & Plan Note (Signed)
 Unable to quit currently.

## 2023-02-01 NOTE — Progress Notes (Signed)
   Subjective:   Patient ID: Amy Hernandez, female    DOB: [birth year], 57 y.o.   MRN: [id number]  HPI The patient is here for physical. With new concerns injury on 01/16/23 and saw orthopedics for her right elbow 01/29/23. Then after going back to work she is having right foot pain severe and swelling. No initial injury with fall on 01/16/23 and was walking on it fine since then. This started hurting Friday/Saturday with new swelling and pain top of foot and around ankle with any movement.   PMH, [facility], social history reviewed and updated  Review of Systems  Constitutional:  Positive for activity change.  HENT: Negative.    Eyes: Negative.   Respiratory:  Negative for cough, chest tightness and shortness of breath.   Cardiovascular:  Negative for chest pain, palpitations and leg swelling.  Gastrointestinal:  Negative for abdominal distention, abdominal pain, constipation, diarrhea, nausea and vomiting.  Musculoskeletal:  Positive for arthralgias, gait problem, joint swelling and myalgias.  Skin: Negative.   Psychiatric/Behavioral: Negative.      Objective:  Physical Exam Constitutional:      Appearance: She is well-developed.  HENT:     Head: Normocephalic and atraumatic.  Cardiovascular:     Rate and Rhythm: Normal rate and regular rhythm.  Pulmonary:     Effort: Pulmonary effort is normal. No respiratory distress.     Breath sounds: Normal breath sounds. No wheezing or rales.  Abdominal:     General: Bowel sounds are normal. There is no distension.     Palpations: Abdomen is soft.     Tenderness: There is no abdominal tenderness. There is no rebound.  Musculoskeletal:        General: Tenderness present.     Cervical back: Normal range of motion.     Comments: Pain right foot on the top middle of foot and around ankle. Not very tender along achilles tendon but minimal. Some swelling compared to left foot. Pain with eversion and inversion as well as plantar flexion and extension.    Skin:    General: Skin is warm and dry.  Neurological:     Mental Status: She is alert and oriented to person, place, and time.     Coordination: Coordination abnormal.     Vitals:   02/01/23 1113  BP: 114/84  Pulse: 88  Temp: 98.7 F (37.1 C)  TempSrc: Oral  SpO2: 96%  Weight: 181 lb (82.1 kg)  Height: 5' 4 (1.626 m)    Assessment & Plan:

## 2023-02-01 NOTE — Assessment & Plan Note (Signed)
 BP at goal on lisinopril/hydrochlorothiazide 20/25 mg daily will check CMP and adjust as needed.

## 2023-02-01 NOTE — Assessment & Plan Note (Signed)
Taking omeprazole 40 mg daily and will continue.  ?

## 2023-02-01 NOTE — Assessment & Plan Note (Signed)
 Controlled with wellbutrin will continue 150 mg daily.

## 2023-02-02 NOTE — Telephone Encounter (Signed)
 Patient verbalized she understood.

## 2023-02-08 DIAGNOSIS — M7021 Olecranon bursitis, right elbow: Secondary | ICD-10-CM | POA: Diagnosis not present

## 2023-02-11 ENCOUNTER — Other Ambulatory Visit: Payer: Self-pay

## 2023-02-15 ENCOUNTER — Other Ambulatory Visit: Payer: Self-pay

## 2023-02-15 ENCOUNTER — Other Ambulatory Visit (HOSPITAL_COMMUNITY): Payer: Self-pay

## 2023-02-15 ENCOUNTER — Encounter (HOSPITAL_COMMUNITY): Payer: Self-pay

## 2023-02-17 ENCOUNTER — Other Ambulatory Visit: Payer: Self-pay

## 2023-02-17 NOTE — Progress Notes (Signed)
Specialty Pharmacy Refill Coordination Note  Amy Hernandez is a 58 y.o. female contacted today regarding refills of specialty medication(s) Bictegravir-Emtricitab-Tenofov [name])   Patient requested Delivery   Delivery date: 02/22/23   Verified address: [address]   [name] [address]   Medication will be filled on 02/19/23.

## 2023-02-17 NOTE — Progress Notes (Signed)
Specialty Pharmacy Ongoing Clinical Assessment Note  Amy Hernandez is a 58 y.o. female who is being followed by the specialty pharmacy service for RxSp HIV   Patient's specialty medication(s) reviewed today: Bictegravir-Emtricitab-Tenofov (Biktarvy)   Missed doses in the last 4 weeks: 0   Patient/Caregiver did not have any additional questions or concerns.   Therapeutic benefit summary: Patient is achieving benefit   Adverse events/side effects summary: No adverse events/side effects   Patient's therapy is appropriate to: Continue    Goals Addressed             This Visit's Progress    Achieve Undetectable HIV Viral Load < 20       Patient is on track. Patient will maintain adherence. Viral load remains undetectable long term.          Follow up:  6 months  Otto Herb Specialty Pharmacist

## 2023-02-19 ENCOUNTER — Other Ambulatory Visit: Payer: Self-pay | Admitting: Internal Medicine

## 2023-02-19 ENCOUNTER — Other Ambulatory Visit: Payer: Self-pay

## 2023-02-19 ENCOUNTER — Other Ambulatory Visit (HOSPITAL_COMMUNITY): Payer: Self-pay

## 2023-02-19 MED ORDER — ZOLPIDEM TARTRATE ER 12.5 MG PO TBCR
12.5000 mg | EXTENDED_RELEASE_TABLET | Freq: Every evening | ORAL | 1 refills | Status: DC | PRN
  Filled 2023-02-19: qty 30, 30d supply, fill #0
  Filled 2023-03-23: qty 30, 30d supply, fill #1
  Filled 2023-04-21: qty 30, 30d supply, fill #2
  Filled 2023-05-24: qty 30, 30d supply, fill #3
  Filled 2023-06-22: qty 30, 30d supply, fill #4
  Filled 2023-07-21: qty 30, 30d supply, fill #5

## 2023-02-19 NOTE — Progress Notes (Addendum)
02/19/23 CMA: Biktarvy  Left voicemail for patient to call Specialty Pharmacy. RTSoon until 01/25. Does patient have enough supply until next delivery date 01/28? Ship 01/27.

## 2023-02-22 ENCOUNTER — Other Ambulatory Visit: Payer: Self-pay

## 2023-02-26 DIAGNOSIS — M545 Low back pain, unspecified: Secondary | ICD-10-CM | POA: Diagnosis not present

## 2023-03-01 ENCOUNTER — Encounter: Payer: Self-pay | Admitting: Internal Medicine

## 2023-03-01 ENCOUNTER — Ambulatory Visit: Payer: Self-pay | Admitting: Internal Medicine

## 2023-03-01 ENCOUNTER — Other Ambulatory Visit (HOSPITAL_BASED_OUTPATIENT_CLINIC_OR_DEPARTMENT_OTHER): Payer: Self-pay

## 2023-03-01 ENCOUNTER — Other Ambulatory Visit: Payer: Self-pay

## 2023-03-01 ENCOUNTER — Telehealth (INDEPENDENT_AMBULATORY_CARE_PROVIDER_SITE_OTHER): Payer: BC Managed Care – PPO | Admitting: Internal Medicine

## 2023-03-01 ENCOUNTER — Other Ambulatory Visit (HOSPITAL_COMMUNITY): Payer: Self-pay

## 2023-03-01 DIAGNOSIS — M545 Low back pain, unspecified: Secondary | ICD-10-CM | POA: Diagnosis not present

## 2023-03-01 MED ORDER — METHOCARBAMOL 500 MG PO TABS
500.0000 mg | ORAL_TABLET | Freq: Three times a day (TID) | ORAL | 0 refills | Status: DC | PRN
  Filled 2023-03-01: qty 60, 20d supply, fill #0

## 2023-03-01 MED ORDER — PREDNISONE 20 MG PO TABS
40.0000 mg | ORAL_TABLET | Freq: Every day | ORAL | 0 refills | Status: DC
  Filled 2023-03-01: qty 10, 5d supply, fill #0

## 2023-03-01 NOTE — Progress Notes (Signed)
Virtual Visit via Video Note  I connected with Amy Hernandez on 03/01/23 at  3:00 PM EST by a video enabled telemedicine application and verified that I am speaking with the correct person using two identifiers.  The patient and the provider were at separate locations throughout the entire encounter. Patient location: home, Provider location: work   I discussed the limitations of evaluation and management by telemedicine and the availability of in person appointments. The patient expressed understanding and agreed to proceed. The patient and the provider were the only parties present for the visit unless noted in HPI below.  History of Present Illness: The patient is a 58 y.o. female with visit for back pain went to UC last Friday and started about a week prior to that. Has started drinking more pepsi recently. Does not radiate. Feels like a severe muscle spasm. They gave her naproxen which is dulling the pain with lying down. Cyclobenzaprine does not help at all. Movement makes it worse. Sleep hard due to pain. No numbness in back or legs.  Observations/Objective: Appearance: normal, breathing appears normal, casual grooming, mental status is A and O  Assessment and Plan: See problem oriented charting  Follow Up Instructions: Rx prednisone and stop flexeril and start methocarbamol instead  I discussed the assessment and treatment plan with the patient. The patient was provided an opportunity to ask questions and all were answered. The patient agreed with the plan and demonstrated an understanding of the instructions.   The patient was advised to call back or seek an in-person evaluation if the symptoms worsen or if the condition fails to improve as anticipated.  Myrlene Broker, MD

## 2023-03-01 NOTE — Telephone Encounter (Signed)
Copied from CRM [id number]. Topic: Clinical - Red Word Triage >> Mar 01, 2023  8:53 AM Almira Coaster wrote: Red Word that prompted transfer to Nurse Triage: Patient was seen at an urgent care on Friday 02/26/2023 due to back pain and was diagnosed with possible muscle injury was given Naproxen and cyclobenzaprine; however, patient believes it may be a kidney issue because she is still feeling excruciating pain even after taking the medication.  Chief Complaint: back pain Symptoms: unbearable pain to lower back Frequency: constant but gets worse with movement. Pertinent Negatives: Patient denies fever, sob, numbness and tingling.  Disposition: [] ED /[] Urgent Care (no appt availability in office) / [x] Appointment(In office/virtual)/ []  [place] [facility]/ [] Home Care/ [] Refused Recommended Disposition /[] [place] [facility]/ []  Follow-up with PCP Additional Notes: states was seen in uc on Friday.  See above.  Symptoms worse and current medication is not helping.  Apt made for this afternoon.  Care advice given, denies questions, instructed to go to er if becomes worse.  PCP office updated.   Reason for Disposition  [1] SEVERE back pain (e.g., excruciating, unable to do any normal activities) AND [2] not improved 2 hours after pain medicine  Answer Assessment - Initial Assessment Questions 1. ONSET: "When did the pain begin?"      Was in uc last Friday.  Pain began Saturday before. 2. LOCATION: "Where does it hurt?" (upper, mid or lower back)     Low back 3. SEVERITY: "How bad is the pain?"  (e.g., Scale 1-10; mild, moderate, or severe)   - MILD (1-3): Doesn't interfere with normal activities.    - MODERATE (4-7): Interferes with normal activities or awakens from sleep.    - SEVERE (8-10): Excruciating pain, unable to do any normal activities.      Left side and hip hurt too, pain is 5-10/10 4. PATTERN: "Is the pain constant?" (e.g., yes, no; constant, intermittent)      Comes and goes.  5.  RADIATION: "Does the pain shoot into your legs or somewhere else?"     Denies  6. CAUSE:  "What do you think is causing the back pain?"      unsure 7. BACK OVERUSE:  "Any recent lifting of heavy objects, strenuous work or exercise?"     denies 8. MEDICINES: "What have you taken so far for the pain?" (e.g., nothing, acetaminophen, NSAIDS)     Naproxen and cyclobenzaprine.  9. NEUROLOGIC SYMPTOMS: "Do you have any weakness, numbness, or problems with bowel/bladder control?"     denies 10. OTHER SYMPTOMS: "Do you have any other symptoms?" (e.g., fever, abdomen pain, burning with urination, blood in urine)       Denies.  11. PREGNANCY: "Is there any chance you are pregnant?" "When was your last menstrual period?"       na  Protocols used: Back Pain-A-AH

## 2023-03-01 NOTE — Assessment & Plan Note (Signed)
New in onset for about 9 days at this time. She does not have red flag signs. No emergent need for imaging. No numbness or weakness. Tried naproxen and flexeril without significant relief. She will continue naproxen and add methocarbamol and prednisone. Stop flexeril. Typical back flare would last 2 weeks or so. No urinary or bowel symptoms to suggest alternative cause.

## 2023-03-11 ENCOUNTER — Other Ambulatory Visit (HOSPITAL_COMMUNITY): Payer: Self-pay

## 2023-03-23 ENCOUNTER — Other Ambulatory Visit (HOSPITAL_COMMUNITY): Payer: Self-pay

## 2023-03-23 ENCOUNTER — Other Ambulatory Visit: Payer: Self-pay

## 2023-03-23 NOTE — Progress Notes (Signed)
 Specialty Pharmacy Refill Coordination Note  Amy Hernandez is a 58 y.o. female contacted today regarding refills of specialty medication(s) Bictegravir-Emtricitab-Tenofov [name])   Patient requested Delivery   Delivery date: 03/26/23   Verified address: [address]   [name] [address]   Medication will be filled on 03/26/23.

## 2023-03-25 ENCOUNTER — Other Ambulatory Visit (HOSPITAL_COMMUNITY): Payer: Self-pay

## 2023-03-25 ENCOUNTER — Other Ambulatory Visit: Payer: Self-pay

## 2023-04-12 ENCOUNTER — Other Ambulatory Visit (HOSPITAL_COMMUNITY): Payer: Self-pay

## 2023-04-19 ENCOUNTER — Other Ambulatory Visit: Payer: Self-pay

## 2023-04-21 ENCOUNTER — Other Ambulatory Visit: Payer: Self-pay

## 2023-04-21 ENCOUNTER — Other Ambulatory Visit (HOSPITAL_COMMUNITY): Payer: Self-pay

## 2023-04-21 NOTE — Progress Notes (Signed)
 Specialty Pharmacy Refill Coordination Note  Amy Hernandez is a 58 y.o. female contacted today regarding refills of specialty medication(s) Bictegravir-Emtricitab-Tenofov [name])   Patient requested Delivery   Delivery date: 04/26/23   Verified address: [address]   [name] [address]   Medication will be filled on 04/23/23.

## 2023-04-23 ENCOUNTER — Other Ambulatory Visit: Payer: Self-pay

## 2023-04-28 ENCOUNTER — Other Ambulatory Visit (HOSPITAL_COMMUNITY): Payer: Self-pay

## 2023-04-28 DIAGNOSIS — T560X1A Toxic effect of lead and its compounds, accidental (unintentional), initial encounter: Secondary | ICD-10-CM | POA: Diagnosis not present

## 2023-04-28 MED ORDER — PREDNISONE 10 MG PO TABS
ORAL_TABLET | ORAL | 0 refills | Status: AC
  Filled 2023-04-28: qty 48, 12d supply, fill #0

## 2023-04-28 MED ORDER — COLCHICINE 0.6 MG PO TABS
0.6000 mg | ORAL_TABLET | Freq: Two times a day (BID) | ORAL | 1 refills | Status: DC
  Filled 2023-04-28: qty 14, 7d supply, fill #0
  Filled 2023-07-13: qty 14, 7d supply, fill #1

## 2023-05-07 ENCOUNTER — Other Ambulatory Visit: Payer: Self-pay

## 2023-05-07 ENCOUNTER — Other Ambulatory Visit (HOSPITAL_COMMUNITY): Payer: Self-pay

## 2023-05-19 ENCOUNTER — Other Ambulatory Visit (HOSPITAL_COMMUNITY): Payer: Self-pay

## 2023-05-19 ENCOUNTER — Other Ambulatory Visit: Payer: Self-pay

## 2023-05-19 DIAGNOSIS — M25571 Pain in right ankle and joints of right foot: Secondary | ICD-10-CM | POA: Diagnosis not present

## 2023-05-19 MED ORDER — NAPROXEN 500 MG PO TABS
500.0000 mg | ORAL_TABLET | Freq: Two times a day (BID) | ORAL | 2 refills | Status: AC | PRN
  Filled 2023-05-19: qty 60, 30d supply, fill #0

## 2023-05-21 ENCOUNTER — Other Ambulatory Visit: Payer: Self-pay

## 2023-05-24 ENCOUNTER — Other Ambulatory Visit: Payer: Self-pay | Admitting: Pharmacy Technician

## 2023-05-24 ENCOUNTER — Other Ambulatory Visit: Payer: Self-pay

## 2023-05-24 ENCOUNTER — Other Ambulatory Visit (HOSPITAL_COMMUNITY): Payer: Self-pay

## 2023-05-24 NOTE — Progress Notes (Signed)
 Specialty Pharmacy Refill Coordination Note  Amy Hernandez is a 58 y.o. female contacted today regarding refills of specialty medication(s) Bictegravir-Emtricitab-Tenofov (Biktarvy )   Patient requested Delivery   Delivery date: 05/27/23   Verified address: [address] [name]   Medication will be filled on 05/26/23.

## 2023-05-26 ENCOUNTER — Other Ambulatory Visit (HOSPITAL_COMMUNITY): Payer: Self-pay

## 2023-05-26 ENCOUNTER — Other Ambulatory Visit: Payer: Self-pay

## 2023-06-15 ENCOUNTER — Other Ambulatory Visit: Payer: Self-pay

## 2023-06-18 ENCOUNTER — Other Ambulatory Visit (HOSPITAL_COMMUNITY): Payer: Self-pay

## 2023-06-18 ENCOUNTER — Other Ambulatory Visit (HOSPITAL_COMMUNITY): Payer: Self-pay | Admitting: Pharmacy Technician

## 2023-06-18 NOTE — Progress Notes (Signed)
 Specialty Pharmacy Refill Coordination Note  Amy Hernandez is a 58 y.o. female contacted today regarding refills of specialty medication(s) Bictegravir-Emtricitab-Tenofov (Biktarvy )   Patient requested Delivery   Delivery date: 06/23/23   Verified address: [address] [name], [place] [address]   Medication will be filled on 06/22/23.

## 2023-06-22 ENCOUNTER — Other Ambulatory Visit: Payer: Self-pay

## 2023-06-22 ENCOUNTER — Telehealth: Payer: Self-pay | Admitting: Infectious Diseases

## 2023-06-22 ENCOUNTER — Other Ambulatory Visit (HOSPITAL_COMMUNITY): Payer: Self-pay

## 2023-06-22 ENCOUNTER — Encounter: Payer: Self-pay | Admitting: Physician Assistant

## 2023-06-22 NOTE — Telephone Encounter (Signed)
-----   Message from Jerilyn Monte sent at 06/22/2023  9:27 AM EDT ----- Regarding: Biktarvy  Hello Trevor Fudge,  I spoke with this patient this am and she is not aware what her deductible is but her copay for Biktarvy  is $3668.00 and she have used up the funds on the [employer] card ($7200.00) and PAF, [employer] is closed there are no funds available. I ran Dovato but her copay for that is $3007.50 which we can use a copay card to cover the rest but that copay card have $6250.00.

## 2023-06-22 NOTE — Telephone Encounter (Signed)
 I called and spoke with Amy Hernandez about two options -   Switch to Dovato now to try ViiVs copay assitance; I did tell [name] that this is also a time limited option for her as her insurance policy is not covering much and she is left with a significant copay she needs to pay per month. If we switch I suspect she will have this same problem in a few months.  Enroll in [facility] switch study - she has been stable on Biktarvy  for years now. I explained that in the study she would be able to get her medication, time and transportation costs covered with the study.   I will send her information with more details via mychart. She will let me know if we need to supply her with a month of samples to get her time to consider options to avoid missing doses.

## 2023-06-22 NOTE — Progress Notes (Signed)
 Madelene Schanz says drug maximum reached. Routed to Donna/Courtney.

## 2023-06-23 ENCOUNTER — Other Ambulatory Visit: Payer: Self-pay

## 2023-06-24 ENCOUNTER — Other Ambulatory Visit: Payer: Self-pay

## 2023-06-24 ENCOUNTER — Other Ambulatory Visit (HOSPITAL_COMMUNITY): Payer: Self-pay

## 2023-06-24 NOTE — Progress Notes (Signed)
 Patient is going into Research study

## 2023-06-25 ENCOUNTER — Encounter: Admitting: *Deleted

## 2023-06-25 NOTE — Progress Notes (Signed)
 The 10-year ASCVD risk score (Arnett DK, et al., 2019) is: 8.1%   Values used to calculate the score:     Age: 58 years     Sex: Female     Is Non-Hispanic African American: Yes     Diabetic: No     Tobacco smoker: Yes     Systolic Blood Pressure: 114 mmHg     Is BP treated: Yes     HDL Cholesterol: 39.6 mg/dL     Total Cholesterol: 170 mg/dL  Currently prescribed atorvastatin  40 mg.  Bradyn Soward, BSN, RN

## 2023-06-28 ENCOUNTER — Other Ambulatory Visit (HOSPITAL_COMMUNITY): Payer: Self-pay

## 2023-06-30 ENCOUNTER — Other Ambulatory Visit: Payer: Self-pay

## 2023-06-30 ENCOUNTER — Telehealth: Admitting: Infectious Diseases

## 2023-07-01 ENCOUNTER — Telehealth (INDEPENDENT_AMBULATORY_CARE_PROVIDER_SITE_OTHER): Admitting: Infectious Diseases

## 2023-07-01 ENCOUNTER — Other Ambulatory Visit: Payer: Self-pay

## 2023-07-01 ENCOUNTER — Other Ambulatory Visit: Payer: Self-pay | Admitting: Pharmacist

## 2023-07-01 ENCOUNTER — Encounter: Payer: Self-pay | Admitting: Infectious Diseases

## 2023-07-01 ENCOUNTER — Other Ambulatory Visit (HOSPITAL_COMMUNITY): Payer: Self-pay

## 2023-07-01 DIAGNOSIS — B20 Human immunodeficiency virus [HIV] disease: Secondary | ICD-10-CM | POA: Diagnosis not present

## 2023-07-01 MED ORDER — DOLUTEGRAVIR-LAMIVUDINE 50-300 MG PO TABS
1.0000 | ORAL_TABLET | Freq: Every day | ORAL | 2 refills | Status: AC
  Filled 2023-07-01: qty 90, 90d supply, fill #0
  Filled 2023-09-22: qty 90, 90d supply, fill #1
  Filled 2023-12-16: qty 90, 90d supply, fill #2

## 2023-07-01 NOTE — Progress Notes (Signed)
 Specialty Pharmacy Initial Fill Coordination Note  Amy Hernandez is a 57 y.o. female contacted today regarding initial fill of specialty medication(s) Dolutegravir-lamiVUDine (DOVATO)   Patient requested Delivery   Delivery date: 07/05/23   Verified address: [address] [name] [address]   Medication will be filled on 07/02/23.   Patient is aware of $0 copayment.

## 2023-07-01 NOTE — Progress Notes (Signed)
 Specialty Pharmacy Initiation Note   Amy Hernandez is a 58 y.o. female who will be followed by the specialty pharmacy service for RxSp HIV    Review of administration, indication, effectiveness, safety, potential side effects, storage/disposable, and missed dose instructions occurred today for patient's specialty medication(s) Dolutegravir-lamiVUDine (DOVATO)     Patient/Caregiver did not have any additional questions or concerns.   Patient's therapy is appropriate to: Initiate    Goals Addressed             This Visit's Progress    Achieve Undetectable HIV Viral Load < 20   On track    Patient is on track. Patient will maintain adherence. Viral load remains undetectable long term.       Comply with lab assessments       Patient is on track. Patient will adhere to provider and/or lab appointments      Maintain optimal adherence to therapy       Patient is on track. Patient will maintain adherence         Sonya Duster Specialty Pharmacist

## 2023-07-01 NOTE — Progress Notes (Signed)
 Patient Name: Amy Hernandez  Date of Birth: 1965/02/01 MRN: [id number]  PCP: Adelia Homestead, MD    VIRTUAL CARE ENCOUNTER  I connected with Davey Erp on 07/01/23 at  1:30 PM EDT by VIDEO and verified that I am speaking with the correct person using two identifiers.   I discussed the limitations, risks, security and privacy concerns of performing an evaluation and management service by telephone and the availability of in person appointments. I also discussed with the patient that there may be a patient responsible charge related to this service. The patient expressed understanding and agreed to proceed.  Patient Location: [facility]   Other Participants:   Provider Location: [facility] Office   SUBJECTIVE:  Brief Narrative:  Amy Hernandez is a 58 y.o. female with well controlled HIV on Biktarvy . HIV Risk: sexual OI Hx: none CD4 nadir > 200 by report  Previous Regimens:  Atripla - well controlled but required switch d/t insurance  Odefsey - did not like s/e or food requirement  Biktarvy  06/2017 - suppressed  Dovato 06/2023 -   Genotype:  sensitive     CC:  Chief Complaint  Patient presents with   Follow-up    Discuss medication     Discussed the use of AI scribe software for clinical note transcription with the patient, who gave verbal consent to proceed.  History of Present Illness   Amy Hernandez is a 58 year old female with HIV who presents with medication cost concerns.  She is experiencing financial difficulties related to the cost of her HIV medication, Biktarvy . The copay requirements from her insurance exceed the assistance available from the manufacturer, causing significant concern.  She has previously tried other medications such as Atripla and Odefsey, the latter of which she did not prefer due to food requirements. She is currently using Biktarvy  and we are exploring options to manage the cost while maintaining her treatment regimen.  There is a  possibility of qualifying for financial assistance through the [employer] program - we have not looked into that route in a while.       Review of Systems  All other systems reviewed and are negative.    Past Medical History:  Diagnosis Date   Barrett's esophagus - short segment 09/04/2016   Bartholin gland cyst    Breast cancer (HCC)    Cervical intraepithelial neoplasia (CIN) 2005   high grade cervical dysplasia prior to total hysterectomy - needs yearly vaginal paps    GERD (gastroesophageal reflux disease)    Granular cell tumor - distal esophagus - smal 09/04/2016   HIV (human immunodeficiency virus infection) (HCC)    Hyperlipidemia    Hypertension    IBS (irritable bowel syndrome)    Insomnia    Substance abuse (HCC)     Outpatient Medications Prior to Visit  Medication Sig Dispense Refill   albuterol  (VENTOLIN  HFA) 108 (90 Base) MCG/ACT inhaler Inhale 1 puff into the lungs every 6 (six) hours as needed for wheezing or shortness of breath. 8.5 g 3   atorvastatin  (LIPITOR) 40 MG tablet Take 1 tablet (40 mg total) by mouth daily. 90 tablet 3   bictegravir-emtricitabine -tenofovir  AF (BIKTARVY ) 50-200-25 MG TABS tablet TAKE 1 TABLET BY MOUTH DAILY. TRY TO TAKE AT THE SAME TIME EACH DAY WITH OR WITHOUT FOOD. 30 tablet 11   buPROPion  (WELLBUTRIN  XL) 150 MG 24 hr tablet Take 1 tablet (150 mg total) by mouth daily. 30 tablet 5  colchicine  0.6 MG tablet Take 1 tablet (0.6 mg total) by mouth 2 (two) times daily with a meal. 14 tablet 1   colesevelam  (WELCHOL ) 625 MG tablet Take 1 tablet by mouth 3 times daily. 90 tablet 11   lisinopril -hydrochlorothiazide  (ZESTORETIC ) 20-25 MG tablet Take 1 tablet by mouth daily. 90 tablet 3   meloxicam  (MOBIC ) 15 MG tablet Take 1 tablet (15 mg total) by mouth daily. 30 tablet 0   methocarbamol  (ROBAXIN ) 500 MG tablet Take 1 tablet (500 mg total) by mouth every 8 (eight) hours as needed for muscle spasms. 60 tablet 0   naproxen  (NAPROSYN ) 500  MG tablet Take 1 tablet (500 mg total) by mouth 2 (two) times daily as needed for pain. 60 tablet 2   omeprazole  (PRILOSEC) 40 MG capsule Take 1 capsule (40 mg total) by mouth daily. 90 capsule 2   ondansetron  (ZOFRAN -ODT) 4 MG disintegrating tablet Dissolve 1 tablet (4 mg total) by mouth every 8 (eight) hours as needed for nausea or vomiting. 20 tablet 0   potassium chloride  SA (KLOR-CON  M) 20 MEQ tablet Take 1 tablet (20 mEq total) by mouth daily. 90 tablet 3   predniSONE  (DELTASONE ) 20 MG tablet Take 2 tablets (40 mg total) by mouth daily with breakfast. 10 tablet 0   zolpidem  (AMBIEN  CR) 12.5 MG CR tablet Take 1 tablet (12.5 mg total) by mouth at bedtime as needed for sleep. 90 tablet 1   No facility-administered medications prior to visit.    Allergies  Allergen Reactions   Codeine     REACTION: hallucinations    Physical Assessment & Diagnostic Findings: There were no vitals filed for this visit.  There is no height or weight on file to calculate BMI.   Physical Exam Constitutional:      Appearance: Normal appearance. She is not ill-appearing.  HENT:     Mouth/Throat:     Mouth: Mucous membranes are moist.     Pharynx: Oropharynx is clear.  Eyes:     General: No scleral icterus. Pulmonary:     Effort: Pulmonary effort is normal.  Neurological:     Mental Status: She is oriented to person, place, and time.  Psychiatric:        Mood and Affect: Mood normal.        Thought Content: Thought content normal.      Lab Results Lab Results  Component Value Date   CREATININE 1.30 (H) 02/01/2023   BUN 17 02/01/2023   NA 137 02/01/2023   K 2.6 (LL) 02/01/2023   CL 92 (L) 02/01/2023   CO2 30 02/01/2023    Lab Results  Component Value Date   ALT 9 02/01/2023   AST 23 02/01/2023   ALKPHOS 119 (H) 02/01/2023   BILITOT 0.3 02/01/2023    Lab Results  Component Value Date   CHOL 170 02/01/2023   HDL 39.60 02/01/2023   LDLCALC 84 02/01/2023   LDLDIRECT 144.0  01/29/2022   TRIG 234.0 (H) 02/01/2023   CHOLHDL 4 02/01/2023   HIV 1 RNA Quant (Copies/mL)  Date Value  11/16/2022 Not Detected  11/11/2021 Not Detected  11/12/2020 Not Detected   CD4 T Cell Abs (/uL)  Date Value  11/16/2022 1,354  11/11/2021 969  11/12/2020 1,224    Assessment & Plan     HIV infection -  Well controlled on Biktarvy  since 2019. Unfortunately we will need to switch manufacturers to help with affording high copays despite health insurance. For now we will put  her on Dovato once daily - 90d supply sent in for her and ViiV copay assitance application started. Will also look into ICAP for her through underinsurance coverage through [employer] - I am hopeful she will qualify for that. Eventually will need to probably move to [facility] later this year for [facility] assistance. Would stay off Symtuza given other drug interactions noted with that.   Potential side effects of Dovato include headache and bowel changes, which are expected to resolve. Emphasized the importance of maintaining treatment adherence and the flexibility of Dovato similar to Biktarvy . Discussed the possibility of returning to Biktarvy  if financial assistance is secured. - Switch to Dovato once daily.  - Stop Biktarvy  when current bottle out.  - Explore [place] program eligibility by submitting pay information to [employer] (email provided). - Arrange for Dovato to be sent to the pharmacy and expedite with Courtney's assistance. - Recommend follow-up blood work in 4-8 weeks post-medication switch.  Recording duration: 12 minutes         Gibson Kurtz, MSN, NP-C [facility]  [email or web address] Pager: [phone] Office: [phone] [facility] Main Line: [phone]

## 2023-07-02 ENCOUNTER — Other Ambulatory Visit: Payer: Self-pay

## 2023-07-13 ENCOUNTER — Other Ambulatory Visit: Payer: Self-pay

## 2023-07-13 ENCOUNTER — Other Ambulatory Visit: Payer: Self-pay | Admitting: Gastroenterology

## 2023-07-13 ENCOUNTER — Other Ambulatory Visit (HOSPITAL_COMMUNITY): Payer: Self-pay

## 2023-07-13 ENCOUNTER — Encounter (HOSPITAL_COMMUNITY): Payer: Self-pay

## 2023-07-19 ENCOUNTER — Other Ambulatory Visit: Payer: Self-pay

## 2023-07-20 ENCOUNTER — Other Ambulatory Visit (HOSPITAL_COMMUNITY): Payer: Self-pay

## 2023-07-21 ENCOUNTER — Other Ambulatory Visit: Payer: Self-pay

## 2023-07-26 ENCOUNTER — Ambulatory Visit (INDEPENDENT_AMBULATORY_CARE_PROVIDER_SITE_OTHER)

## 2023-07-26 ENCOUNTER — Ambulatory Visit: Payer: Self-pay

## 2023-07-26 ENCOUNTER — Encounter: Payer: Self-pay | Admitting: Emergency Medicine

## 2023-07-26 ENCOUNTER — Ambulatory Visit (INDEPENDENT_AMBULATORY_CARE_PROVIDER_SITE_OTHER): Admitting: Emergency Medicine

## 2023-07-26 ENCOUNTER — Other Ambulatory Visit: Payer: Self-pay

## 2023-07-26 ENCOUNTER — Other Ambulatory Visit (HOSPITAL_COMMUNITY): Payer: Self-pay

## 2023-07-26 ENCOUNTER — Ambulatory Visit: Payer: Self-pay | Admitting: Emergency Medicine

## 2023-07-26 VITALS — BP 124/68 | HR 90 | Temp 99.2°F | Ht 64.0 in | Wt 178.0 lb

## 2023-07-26 DIAGNOSIS — S39012A Strain of muscle, fascia and tendon of lower back, initial encounter: Secondary | ICD-10-CM | POA: Insufficient documentation

## 2023-07-26 DIAGNOSIS — M545 Low back pain, unspecified: Secondary | ICD-10-CM | POA: Diagnosis not present

## 2023-07-26 DIAGNOSIS — M5136 Other intervertebral disc degeneration, lumbar region with discogenic back pain only: Secondary | ICD-10-CM | POA: Diagnosis not present

## 2023-07-26 DIAGNOSIS — M4807 Spinal stenosis, lumbosacral region: Secondary | ICD-10-CM | POA: Diagnosis not present

## 2023-07-26 MED ORDER — MELOXICAM 15 MG PO TABS
15.0000 mg | ORAL_TABLET | Freq: Every day | ORAL | 0 refills | Status: AC
  Filled 2023-07-26 (×2): qty 10, 10d supply, fill #0

## 2023-07-26 MED ORDER — TRAMADOL HCL 50 MG PO TABS
50.0000 mg | ORAL_TABLET | Freq: Three times a day (TID) | ORAL | 1 refills | Status: AC | PRN
  Filled 2023-07-26 (×2): qty 15, 5d supply, fill #0

## 2023-07-26 MED ORDER — CYCLOBENZAPRINE HCL 10 MG PO TABS
10.0000 mg | ORAL_TABLET | Freq: Three times a day (TID) | ORAL | 0 refills | Status: AC
  Filled 2023-07-26 (×2): qty 30, 10d supply, fill #0

## 2023-07-26 MED ORDER — KETOROLAC TROMETHAMINE 60 MG/2ML IM SOLN
60.0000 mg | Freq: Once | INTRAMUSCULAR | Status: AC
  Administered 2023-07-26: 60 mg via INTRAMUSCULAR

## 2023-07-26 NOTE — Patient Instructions (Signed)
 Acute Back Pain, Adult Acute back pain is sudden and usually short-lived. It is often caused by an injury to the muscles and tissues in the back. The injury may result from: A muscle, tendon, or ligament getting overstretched or torn. Ligaments are tissues that connect bones to each other. Lifting something improperly can cause a back strain. Wear and tear (degeneration) of the spinal disks. Spinal disks are circular tissue that provide cushioning between the bones of the spine (vertebrae). Twisting motions, such as while playing sports or doing yard work. A hit to the back. Arthritis. You may have a physical exam, lab tests, and imaging tests to find the cause of your pain. Acute back pain usually goes away with rest and home care. Follow these instructions at home: Managing pain, stiffness, and swelling Take over-the-counter and prescription medicines only as told by your health care provider. Treatment may include medicines for pain and inflammation that are taken by mouth or applied to the skin, or muscle relaxants. Your health care provider may recommend applying ice during the first 24-48 hours after your pain starts. To do this: Put ice in a plastic bag. Place a towel between your skin and the bag. Leave the ice on for 20 minutes, 2-3 times a day. Remove the ice if your skin turns bright red. This is very important. If you cannot feel pain, heat, or cold, you have a greater risk of damage to the area. If directed, apply heat to the affected area as often as told by your health care provider. Use the heat source that your health care provider recommends, such as a moist heat pack or a heating pad. Place a towel between your skin and the heat source. Leave the heat on for 20-30 minutes. Remove the heat if your skin turns bright red. This is especially important if you are unable to feel pain, heat, or cold. You have a greater risk of getting burned. Activity  Do not stay in bed. Staying in  bed for more than 1-2 days can delay your recovery. Sit up and stand up straight. Avoid leaning forward when you sit or hunching over when you stand. If you work at a desk, sit close to it so you do not need to lean over. Keep your chin tucked in. Keep your neck drawn back, and keep your elbows bent at a 90-degree angle (right angle). Sit high and close to the steering wheel when you drive. Add lower back (lumbar) support to your car seat, if needed. Take short walks on even surfaces as soon as you are able. Try to increase the length of time you walk each day. Do not sit, drive, or stand in one place for more than 30 minutes at a time. Sitting or standing for long periods of time can put stress on your back. Do not drive or use heavy machinery while taking prescription pain medicine. Use proper lifting techniques. When you bend and lift, use positions that put less stress on your back: [place] your knees. Keep the load close to your body. Avoid twisting. Exercise regularly as told by your health care provider. Exercising helps your back heal faster and helps prevent back injuries by keeping muscles strong and flexible. Work with a physical therapist to make a safe exercise program, as recommended by your health care provider. Do any exercises as told by your physical therapist. Lifestyle Maintain a healthy weight. Extra weight puts stress on your back and makes it difficult to have good  posture. Avoid activities or situations that make you feel anxious or stressed. Stress and anxiety increase muscle tension and can make back pain worse. Learn ways to manage anxiety and stress, such as through exercise. General instructions Sleep on a firm mattress in a comfortable position. Try lying on your side with your knees slightly bent. If you lie on your back, put a pillow under your knees. Keep your head and neck in a straight line with your spine (neutral position) when using electronic equipment like  smartphones or pads. To do this: Raise your smartphone or pad to look at it instead of bending your head or neck to look down. Put the smartphone or pad at the level of your face while looking at the screen. Follow your treatment plan as told by your health care provider. This may include: Cognitive or behavioral therapy. Acupuncture or massage therapy. Meditation or yoga. Contact a health care provider if: You have pain that is not relieved with rest or medicine. You have increasing pain going down into your legs or buttocks. Your pain does not improve after 2 weeks. You have pain at night. You lose weight without trying. You have a fever or chills. You develop nausea or vomiting. You develop abdominal pain. Get help right away if: You develop new bowel or bladder control problems. You have unusual weakness or numbness in your arms or legs. You feel faint. These symptoms may represent a serious problem that is an emergency. Do not wait to see if the symptoms will go away. Get medical help right away. Call your local emergency services (911 in the [place] Do not drive yourself to the hospital. Summary Acute back pain is sudden and usually short-lived. Use proper lifting techniques. When you bend and lift, use positions that put less stress on your back. Take over-the-counter and prescription medicines only as told by your health care provider, and apply heat or ice as told. This information is not intended to replace advice given to you by your health care provider. Make sure you discuss any questions you have with your health care provider. Document Revised: 04/05/2020 Document Reviewed: 04/05/2020 Elsevier Patient Education  2024 [employer].

## 2023-07-26 NOTE — Progress Notes (Signed)
 Amy Hernandez 58 y.o.   Chief Complaint  Patient presents with   Back Pain    Patient here for back pain, she states she's been having back pain for the past few months. She believes she may have gout in her back. She is taking naproxen  for her foot, that doesn't help. Also taking colchicine  but doesn't help. The pain is worse when standing, walking, and laying.     HISTORY OF PRESENT ILLNESS: This is a 58 y.o. female complaining of severe low back pain that started on Friday and persisted over the weekend Pain is worse with movement, some radiation to left leg, constant sharp pain, no associated symptoms Has had similar episodes before. No other complaints or medical complaints today  Back Pain Pertinent negatives include no abdominal pain, chest pain, dysuria, fever or headaches.     Prior to Admission medications   Medication Sig Start Date End Date Taking? Authorizing Provider  dolutegravir -lamiVUDine  (DOVATO ) 50-300 MG tablet Take 1 tablet by mouth daily. 07/01/23  Yes Melvenia Corean SAILOR, NP  lisinopril -hydrochlorothiazide  (ZESTORETIC ) 20-25 MG tablet Take 1 tablet by mouth daily. 02/01/23  Yes Rollene Almarie LABOR, MD  naproxen  (NAPROSYN ) 500 MG tablet Take 1 tablet (500 mg total) by mouth 2 (two) times daily as needed for pain. 05/19/23  Yes   omeprazole  (PRILOSEC) 40 MG capsule Take 1 capsule (40 mg total) by mouth daily. 10/05/22 10/05/23 Yes Nandigam, Kavitha V, MD  potassium chloride  SA (KLOR-CON  M) 20 MEQ tablet Take 1 tablet (20 mEq total) by mouth daily. 02/01/23  Yes Rollene Almarie LABOR, MD  zolpidem  (AMBIEN  CR) 12.5 MG CR tablet Take 1 tablet (12.5 mg total) by mouth at bedtime as needed for sleep. 02/19/23  Yes Rollene Almarie LABOR, MD  albuterol  (VENTOLIN  HFA) 108 [phone] Base) MCG/ACT inhaler Inhale 1 puff into the lungs every 6 (six) hours as needed for wheezing or shortness of breath. 05/07/20   Melvenia Corean SAILOR, NP  atorvastatin  (LIPITOR) 40 MG tablet Take 1 tablet (40 mg  total) by mouth daily. 02/01/23   Rollene Almarie LABOR, MD  buPROPion  (WELLBUTRIN  XL) 150 MG 24 hr tablet Take 1 tablet (150 mg total) by mouth daily. 11/25/21   Dohmeier, Dedra, MD  colchicine  0.6 MG tablet Take 1 tablet (0.6 mg total) by mouth 2 (two) times daily with a meal. Patient not taking: Reported on 07/26/2023 04/28/23     colesevelam  (WELCHOL ) 625 MG tablet Take 1 tablet by mouth 3 times daily. 06/26/22   Nandigam, Kavitha V, MD  meloxicam  (MOBIC ) 15 MG tablet Take 1 tablet (15 mg total) by mouth daily. Patient not taking: Reported on 07/26/2023 02/01/23   Rollene Almarie LABOR, MD  methocarbamol  (ROBAXIN ) 500 MG tablet Take 1 tablet (500 mg total) by mouth every 8 (eight) hours as needed for muscle spasms. Patient not taking: Reported on 07/26/2023 03/01/23   Rollene Almarie LABOR, MD  ondansetron  (ZOFRAN -ODT) 4 MG disintegrating tablet Dissolve 1 tablet (4 mg total) by mouth every 8 (eight) hours as needed for nausea or vomiting. Patient not taking: Reported on 07/26/2023 08/28/21   Purcell Emil Schanz, MD  predniSONE  (DELTASONE ) 20 MG tablet Take 2 tablets (40 mg total) by mouth daily with breakfast. Patient not taking: Reported on 07/26/2023 03/01/23   Rollene Almarie LABOR, MD    Allergies  Allergen Reactions   Codeine     REACTION: hallucinations    Patient Active Problem List   Diagnosis Date Noted   Acute bilateral low back  pain without sciatica 03/01/2023   Right foot pain 02/01/2023   Pre-diabetes 02/01/2023   Right elbow pain 02/01/2023   OSA on CPAP 04/15/2021   Chronic insomnia 06/25/2020   GERD (gastroesophageal reflux disease) 10/11/2018   Smoker 10/11/2018   Depression 12/12/2016   Elevated alkaline phosphatase level 12/12/2016   Granular cell tumor - distal esophagus - smal 09/04/2016   Barrett's esophagus - short segment 09/04/2016   IBS (irritable bowel syndrome) 08/28/2014   Encounter for general adult medical examination with abnormal findings 05/16/2013   HLD  (hyperlipidemia) 02/07/2009   Breast pain 06/16/2006   Human immunodeficiency virus (HIV) disease (HCC) 12/05/2005   Essential hypertension 12/05/2005    Past Medical History:  Diagnosis Date   Barrett's esophagus - short segment 09/04/2016   Bartholin gland cyst    Breast cancer (HCC)    Cervical intraepithelial neoplasia (CIN) 2005   high grade cervical dysplasia prior to total hysterectomy - needs yearly vaginal paps    GERD (gastroesophageal reflux disease)    Granular cell tumor - distal esophagus - smal 09/04/2016   HIV (human immunodeficiency virus infection) (HCC)    Hyperlipidemia    Hypertension    IBS (irritable bowel syndrome)    Insomnia    Substance abuse (HCC)     Past Surgical History:  Procedure Laterality Date   ABDOMINAL HYSTERECTOMY  09/2003   ANAL SPHINCTEROTOMY     BREAST BIOPSY Left 12/31/2021   MM LT BREAST BX W LOC DEV 1ST LESION IMAGE BX SPEC STEREO GUIDE 12/31/2021 [facility]   BREAST BIOPSY Right 12/31/2021   MM RT BREAST BX W LOC DEV 1ST LESION IMAGE BX SPEC STEREO GUIDE 12/31/2021 [facility]   BREAST BIOPSY Left 12/31/2021   MM LT BREAST BX W LOC DEV EA AD LESION IMG BX SPEC STEREO GUIDE 12/31/2021 [facility]   CHOLECYSTECTOMY  1990   COLONOSCOPY  2009   NL   ESOPHAGOGASTRODUODENOSCOPY  2018   LEEP  2000   history of CIN    TUBAL LIGATION      Social History   Socioeconomic History   Marital status: Legally Separated    Spouse name: Not on file   Number of children: 2   Years of education: Not on file   Highest education level: Not on file  Occupational History   Occupation: Customer service rep    Employer: [employer]  Tobacco Use   Smoking status: Every Day    Current packs/day: 0.30    Average packs/day: 0.3 packs/day for 30.0 years (9.0 ttl pk-yrs)    Types: Cigarettes   Smokeless tobacco: Never   Tobacco comments:    Pt states she is working on quitting-11/16/2022.  Vaping Use   Vaping status: Never  Used  Substance and Sexual Activity   Alcohol use: No    Alcohol/week: 0.0 standard drinks of alcohol   Drug use: No   Sexual activity: Not Currently    Partners: Male    Comment:  declined condoms  Other Topics Concern   Not on file  Social History Narrative   Married but legally separated, 2 daughters grandchild at home   Customer service rep for [employer]   No  alcohol or drug use   She is a smoker   Social Drivers of Corporate investment banker Strain: Not on file  Food Insecurity: Not on file  Transportation Needs: Not on file  Physical Activity: Not on file  Stress: Not on file  Social  Connections: Not on file  Intimate Partner Violence: Not on file    Family History  Problem Relation Age of Onset   Diabetes Mother    Hyperlipidemia Mother    Hypertension Mother    Cancer Father        bladder cancer    Heart disease Father    Hypertension Father    Breast cancer Maternal Grandmother    Diabetes Maternal Grandmother    Hyperlipidemia Maternal Grandmother    Colon cancer Neg Hx    Esophageal cancer Neg Hx    Rectal cancer Neg Hx    Stomach cancer Neg Hx      Review of Systems  Constitutional: Negative.  Negative for chills and fever.  HENT: Negative.  Negative for congestion and sore throat.   Respiratory: Negative.  Negative for cough and shortness of breath.   Cardiovascular: Negative.  Negative for chest pain and palpitations.  Gastrointestinal:  Negative for abdominal pain, diarrhea, nausea and vomiting.  Genitourinary: Negative.  Negative for dysuria and hematuria.  Musculoskeletal:  Positive for back pain.  Skin: Negative.  Negative for rash.  Neurological: Negative.  Negative for dizziness, focal weakness and headaches.  All other systems reviewed and are negative.   Vitals:   07/26/23 1402  BP: 124/68  Pulse: 90  Temp: 99.2 F (37.3 C)  SpO2: 95%     Physical Exam Vitals reviewed.  Constitutional:      Appearance: Normal  appearance.  HENT:     Head: Normocephalic.   Eyes:     Extraocular Movements: Extraocular movements intact.    Cardiovascular:     Rate and Rhythm: Normal rate and regular rhythm.  Pulmonary:     Effort: Pulmonary effort is normal.     Breath sounds: Normal breath sounds.  Abdominal:     Palpations: Abdomen is soft.     Tenderness: There is no abdominal tenderness.   Musculoskeletal:     Lumbar back: Tenderness present. No bony tenderness. Decreased range of motion.   Skin:    General: Skin is warm and dry.   Neurological:     Mental Status: She is alert and oriented to person, place, and time.   Psychiatric:        Mood and Affect: Mood normal.        Behavior: Behavior normal.    DG Lumbar Spine 2-3 Views Result Date: 07/26/2023 CLINICAL DATA:  Low back pain for 4 days.  No known injury. EXAM: LUMBAR SPINE - 2-3 VIEW COMPARISON:  None Available. FINDINGS: Five non-rib-bearing lumbar vertebra. Normal lumbar alignment. Normal vertebral body heights. Anterior spurring multiple levels. Mild L4-L5 and L5-S1 disc space narrowing. Mild L3-L4 and L4-L5 facet hypertrophy. No evidence of fracture, pars defect or focal bone abnormality. The sacroiliac joints are congruent. IMPRESSION: Mild degenerative disc disease and facet hypertrophy in the lower lumbar spine. Electronically Signed   By: Andrea Gasman M.D.   On: 07/26/2023 15:02     ASSESSMENT & PLAN: A total of 42 minutes was spent with the patient and counseling/coordination of care regarding preparing for this visit, review of most recent office visit notes, review of multiple chronic medical conditions and their management, diagnosis of acute lumbar strain/pain and management, need for orthopedic evaluation, review of x-ray images done today, pain management, review of all medications, review of most recent bloodwork results,  prognosis, documentation, and need for follow up.  Problem List Items Addressed This Visit  Musculoskeletal and Integument   Acute lumbar myofascial strain   X-ray shows degenerative disc disease of lumbar spine Acute and affecting activities of daily life significantly Pain management discussed Recommend Flexeril  3 times a day, tramadol as needed, and daily meloxicam  15 mg Recommend to rest and ambulate as needed Recommend crutches or preferably walker Needs orthopedic evaluation.  Referral placed today      Relevant Medications   cyclobenzaprine  (FLEXERIL ) 10 MG tablet   traMADol (ULTRAM) 50 MG tablet   meloxicam  (MOBIC ) 15 MG tablet   Other Relevant Orders   DG Lumbar Spine 2-3 Views (Completed)     Other   Acute bilateral low back pain without sciatica - Primary   Musculoskeletal and mechanical in nature X-rays show degenerative disc disease of lumbar spine Recommend orthopedic evaluation.  Referral placed today. Pain management discussed Recommend Flexeril  10 mg 3 times a day, daily meloxicam  15 mg, Tylenol  for mild pain and tramadol for moderate to severe pain May benefit from physical therapy in the near future      Relevant Medications   cyclobenzaprine  (FLEXERIL ) 10 MG tablet   traMADol (ULTRAM) 50 MG tablet   meloxicam  (MOBIC ) 15 MG tablet   Other Relevant Orders   DG Lumbar Spine 2-3 Views (Completed)   Patient Instructions  Acute Back Pain, Adult Acute back pain is sudden and usually short-lived. It is often caused by an injury to the muscles and tissues in the back. The injury may result from: A muscle, tendon, or ligament getting overstretched or torn. Ligaments are tissues that connect bones to each other. Lifting something improperly can cause a back strain. Wear and tear (degeneration) of the spinal disks. Spinal disks are circular tissue that provide cushioning between the bones of the spine (vertebrae). Twisting motions, such as while playing sports or doing yard work. A hit to the back. Arthritis. You may have a physical exam, lab tests, and  imaging tests to find the cause of your pain. Acute back pain usually goes away with rest and home care. Follow these instructions at home: Managing pain, stiffness, and swelling Take over-the-counter and prescription medicines only as told by your health care provider. Treatment may include medicines for pain and inflammation that are taken by mouth or applied to the skin, or muscle relaxants. Your health care provider may recommend applying ice during the first 24-48 hours after your pain starts. To do this: Put ice in a plastic bag. Place a towel between your skin and the bag. Leave the ice on for 20 minutes, 2-3 times a day. Remove the ice if your skin turns bright red. This is very important. If you cannot feel pain, heat, or cold, you have a greater risk of damage to the area. If directed, apply heat to the affected area as often as told by your health care provider. Use the heat source that your health care provider recommends, such as a moist heat pack or a heating pad. Place a towel between your skin and the heat source. Leave the heat on for 20-30 minutes. Remove the heat if your skin turns bright red. This is especially important if you are unable to feel pain, heat, or cold. You have a greater risk of getting burned. Activity  Do not stay in bed. Staying in bed for more than 1-2 days can delay your recovery. Sit up and stand up straight. Avoid leaning forward when you sit or hunching over when you stand. If you work at [employer],  sit close to it so you do not need to lean over. Keep your chin tucked in. Keep your neck drawn back, and keep your elbows bent at a 90-degree angle (right angle). Sit high and close to the steering wheel when you drive. Add lower back (lumbar) support to your car seat, if needed. Take short walks on even surfaces as soon as you are able. Try to increase the length of time you walk each day. Do not sit, drive, or stand in one place for more than 30 minutes at a  time. Sitting or standing for long periods of time can put stress on your back. Do not drive or use heavy machinery while taking prescription pain medicine. Use proper lifting techniques. When you bend and lift, use positions that put less stress on your back: Scooba your knees. Keep the load close to your body. Avoid twisting. Exercise regularly as told by your health care provider. Exercising helps your back heal faster and helps prevent back injuries by keeping muscles strong and flexible. Work with a physical therapist to make a safe exercise program, as recommended by your health care provider. Do any exercises as told by your physical therapist. Lifestyle Maintain a healthy weight. Extra weight puts stress on your back and makes it difficult to have good posture. Avoid activities or situations that make you feel anxious or stressed. Stress and anxiety increase muscle tension and can make back pain worse. Learn ways to manage anxiety and stress, such as through exercise. General instructions Sleep on a firm mattress in a comfortable position. Try lying on your side with your knees slightly bent. If you lie on your back, put a pillow under your knees. Keep your head and neck in a straight line with your spine (neutral position) when using electronic equipment like smartphones or pads. To do this: Raise your smartphone or pad to look at it instead of bending your head or neck to look down. Put the smartphone or pad at the level of your face while looking at the screen. Follow your treatment plan as told by your health care provider. This may include: Cognitive or behavioral therapy. Acupuncture or massage therapy. Meditation or yoga. Contact a health care provider if: You have pain that is not relieved with rest or medicine. You have increasing pain going down into your legs or buttocks. Your pain does not improve after 2 weeks. You have pain at night. You lose weight without trying. You  have a fever or chills. You develop nausea or vomiting. You develop abdominal pain. Get help right away if: You develop new bowel or bladder control problems. You have unusual weakness or numbness in your arms or legs. You feel faint. These symptoms may represent a serious problem that is an emergency. Do not wait to see if the symptoms will go away. Get medical help right away. Call your local emergency services (911 in the [place] Do not drive yourself to the hospital. Summary Acute back pain is sudden and usually short-lived. Use proper lifting techniques. When you bend and lift, use positions that put less stress on your back. Take over-the-counter and prescription medicines only as told by your health care provider, and apply heat or ice as told. This information is not intended to replace advice given to you by your health care provider. Make sure you discuss any questions you have with your health care provider. Document Revised: 04/05/2020 Document Reviewed: 04/05/2020 Elsevier Patient Education  2024 [employer].  Emil Schaumann, MD [facility]

## 2023-07-26 NOTE — Assessment & Plan Note (Signed)
 Musculoskeletal and mechanical in nature X-rays show degenerative disc disease of lumbar spine Recommend orthopedic evaluation.  Referral placed today. Pain management discussed Recommend Flexeril  10 mg 3 times a day, daily meloxicam  15 mg, Tylenol  for mild pain and tramadol for moderate to severe pain May benefit from physical therapy in the near future

## 2023-07-26 NOTE — Telephone Encounter (Signed)
 FYI Only or Action Required?: FYI only for provider.  Patient was last seen in primary care on 03/01/2023 by Rollene Almarie LABOR, MD. Called Nurse Triage reporting Back Pain. Symptoms began several days ago. Interventions attempted: Prescription medications: muscle relaxer and Naproxen . Symptoms are: gradually worsening.  Triage Disposition: See HCP Within 4 Hours (Or PCP Triage)  Patient/caregiver understands and will follow disposition?: Yes  **Pt. Scheduled for 6/30**       Copied from CRM #[id number]. Topic: Clinical - Red Word Triage >> Jul 26, 2023  9:29 AM Martinique E wrote: [facility] that prompted transfer to Nurse Triage: Gout in back, patient stated she had it in her right foot too, but that is slowly getting better, but migrated to back. Rated pain 7 out of 10. Patient stated colchicine  0.6 MG tablet causes upset stomach. Reason for Disposition  [1] SEVERE back pain (e.g., excruciating, unable to do any normal activities) AND [2] not improved 2 hours after pain medicine  Answer Assessment - Initial Assessment Questions 1. ONSET: When did the pain begin?      Last Thursday   2. LOCATION: Where does it hurt? (upper, mid or lower back)     Lower back, radiates into thighs   3. SEVERITY: How bad is the pain?  (e.g., Scale 1-10; mild, moderate, or severe)   - MILD (1-3): Doesn't interfere with normal activities.    - MODERATE (4-7): Interferes with normal activities or awakens from sleep.    - SEVERE (8-10): Excruciating pain, unable to do any normal activities.                 7/10   4. PATTERN: Is the pain constant? (e.g., yes, no; constant, intermittent)      Constant   5. RADIATION: Does the pain shoot into your legs or somewhere else?     Left Thigh  6. CAUSE:  What do you think is causing the back pain?      Says her gout flared up, but she is not sure  7. BACK OVERUSE:  Any recent lifting of heavy objects, strenuous work or exercise?     No   8.  MEDICINES: What have you taken so far for the pain? (e.g., nothing, acetaminophen , NSAIDS)     Naproxen    9. NEUROLOGIC SYMPTOMS: Do you have any weakness, numbness, or problems with bowel/bladder control?     No   10. OTHER SYMPTOMS: Do you have any other symptoms? (e.g., fever, abdomen pain, burning with urination, blood in urine)     No     Taking previously prescribed muscle relaxer's.  Protocols used: Back Pain-A-AH

## 2023-07-26 NOTE — Assessment & Plan Note (Signed)
 X-ray shows degenerative disc disease of lumbar spine Acute and affecting activities of daily life significantly Pain management discussed Recommend Flexeril  3 times a day, tramadol as needed, and daily meloxicam  15 mg Recommend to rest and ambulate as needed Recommend crutches or preferably walker Needs orthopedic evaluation.  Referral placed today

## 2023-07-27 ENCOUNTER — Other Ambulatory Visit (HOSPITAL_BASED_OUTPATIENT_CLINIC_OR_DEPARTMENT_OTHER): Payer: Self-pay

## 2023-08-03 ENCOUNTER — Telehealth: Payer: Self-pay

## 2023-08-03 ENCOUNTER — Ambulatory Visit: Payer: Self-pay

## 2023-08-03 NOTE — Telephone Encounter (Signed)
 FYI Only or Action Required?: Action required by provider: request for appointment and clinical question for provider.  Patient was last seen in primary care on 07/26/2023 by Amy Emil Schanz, MD.  Called Nurse Triage reporting No chief complaint on file..  Symptoms began yesterday.  Interventions attempted: Ice/heat application.  Symptoms are: gradually worsening.  Triage Disposition: Call PCP Within 24 Hours  Patient/caregiver understands and will follow disposition?:  Answer Assessment - Initial Assessment Questions 1. ONSET: When did the pain begin?      Chronic, Degenerative Disc Disease  2. LOCATION: Where does it hurt? (upper, mid or lower back)     Going up the back  3. SEVERITY: How bad is the pain?  (e.g., Scale 1-10; mild, moderate, or severe)   - MILD (1-3): Doesn't interfere with normal activities.    - MODERATE (4-7): Interferes with normal activities or awakens from sleep.    - SEVERE (8-10): Excruciating pain, unable to do any normal activities.      Moderate  4. PATTERN: Is the pain constant? (e.g., yes, no; constant, intermittent)      Constant  5. RADIATION: Does the pain shoot into your legs or somewhere else?     Radiates up the back, and to the hip and thigh  6. CAUSE:  What do you think is causing the back pain?      Sitting in the chair for work  7. BACK OVERUSE:  Any recent lifting of heavy objects, strenuous work or exercise?     :No  8. MEDICINES: What have you taken so far for the pain? (e.g., nothing, acetaminophen , NSAIDS)     Nothing  9. NEUROLOGIC SYMPTOMS: Do you have any weakness, numbness, or problems with bowel/bladder control?     No  10. OTHER SYMPTOMS: Do you have any other symptoms? (e.g., fever, abdomen pain, burning with urination, blood in urine)       No  11. PREGNANCY: Is there any chance you are pregnant? When was your last menstrual period?       No and No  Protocols used: Back Pain-A-AH

## 2023-08-03 NOTE — Telephone Encounter (Signed)
  First attempt; no answer         Copied from CRM [id number]. Topic: Clinical - Medical Advice >> Aug 03, 2023  1:14 PM Abigail D wrote: Reason for CRM: Patient would like medical advice for her degenerative disk disorder, what is the best practice for managing pain while working from home/sitting in a chair for long hours?

## 2023-08-03 NOTE — Telephone Encounter (Signed)
 Copied from CRM [id number]. Topic: Referral - Question >> Aug 03, 2023  1:09 PM Abigail D wrote: Reason for CRM: Patient is calling in reference to her upcoming appt for orthopedics for her back pain. She said she has her appt scheduled but she would like to verify that the imaging has been faxed over prior to her appointment, it is scheduled for Monday the 14th at 10am at [facility]. Please give her a call to verify that this has been sent.  Phone: [phone] Fax: [phone]

## 2023-08-06 ENCOUNTER — Telehealth: Payer: Self-pay

## 2023-08-06 NOTE — Telephone Encounter (Signed)
 Copied from CRM [id number]. Topic: Referral - Question >> Aug 03, 2023  1:09 PM Abigail D wrote: Reason for CRM: Patient is calling in reference to her upcoming appt for orthopedics for her back pain. She said she has her appt scheduled but she would like to verify that the imaging has been faxed over prior to her appointment, it is scheduled for Monday the 14th at 10am at [facility]. Please give her a call to verify that this has been sent.  Phone: [phone] Fax: [phone] >> Aug 06, 2023 10:50 AM Geroldine GRADE wrote: Patient is calling back in for an update to see if the imaging is going to be sent over or does she need to come pick a copy since her appointment is on Monday 7/14

## 2023-08-09 ENCOUNTER — Other Ambulatory Visit (HOSPITAL_COMMUNITY): Payer: Self-pay

## 2023-08-09 ENCOUNTER — Other Ambulatory Visit: Payer: Self-pay

## 2023-08-09 DIAGNOSIS — M545 Low back pain, unspecified: Secondary | ICD-10-CM | POA: Diagnosis not present

## 2023-08-09 MED ORDER — MELOXICAM 15 MG PO TABS
15.0000 mg | ORAL_TABLET | Freq: Every day | ORAL | 0 refills | Status: AC
  Filled 2023-08-09: qty 30, 30d supply, fill #0

## 2023-08-09 MED ORDER — DIAZEPAM 5 MG PO TABS
ORAL_TABLET | ORAL | 0 refills | Status: AC
  Filled 2023-08-09 (×2): qty 2, 1d supply, fill #0

## 2023-08-09 MED ORDER — METHYLPREDNISOLONE 4 MG PO TBPK
ORAL_TABLET | ORAL | 0 refills | Status: DC
  Filled 2023-08-09: qty 21, 6d supply, fill #0

## 2023-08-09 NOTE — Telephone Encounter (Signed)
 Pt had appt today at 10 am. Pt our office can let us  know if she still needs imaging sent over

## 2023-08-16 ENCOUNTER — Ambulatory Visit: Payer: BC Managed Care – PPO | Admitting: Infectious Diseases

## 2023-08-19 ENCOUNTER — Other Ambulatory Visit (HOSPITAL_COMMUNITY): Payer: Self-pay

## 2023-08-19 ENCOUNTER — Other Ambulatory Visit: Payer: Self-pay | Admitting: Internal Medicine

## 2023-08-20 ENCOUNTER — Other Ambulatory Visit (HOSPITAL_COMMUNITY): Payer: Self-pay

## 2023-08-20 ENCOUNTER — Other Ambulatory Visit: Payer: Self-pay

## 2023-08-20 MED ORDER — ZOLPIDEM TARTRATE ER 12.5 MG PO TBCR
12.5000 mg | EXTENDED_RELEASE_TABLET | Freq: Every evening | ORAL | 1 refills | Status: DC | PRN
  Filled 2023-08-20: qty 30, 30d supply, fill #0
  Filled 2023-09-22: qty 30, 30d supply, fill #1
  Filled 2023-10-20: qty 30, 30d supply, fill #2
  Filled 2023-11-18: qty 30, 30d supply, fill #3
  Filled 2023-12-20: qty 30, 30d supply, fill #4
  Filled 2024-01-18: qty 30, 30d supply, fill #5

## 2023-08-21 DIAGNOSIS — M545 Low back pain, unspecified: Secondary | ICD-10-CM | POA: Diagnosis not present

## 2023-08-24 ENCOUNTER — Other Ambulatory Visit (HOSPITAL_COMMUNITY): Payer: Self-pay

## 2023-08-24 ENCOUNTER — Ambulatory Visit: Admitting: Infectious Diseases

## 2023-08-24 ENCOUNTER — Other Ambulatory Visit: Payer: Self-pay | Admitting: Infectious Diseases

## 2023-08-24 DIAGNOSIS — B20 Human immunodeficiency virus [HIV] disease: Secondary | ICD-10-CM

## 2023-08-24 MED ORDER — METHYLPREDNISOLONE 4 MG PO TBPK
ORAL_TABLET | ORAL | 0 refills | Status: DC
  Filled 2023-08-24: qty 21, 6d supply, fill #0

## 2023-09-08 ENCOUNTER — Other Ambulatory Visit (HOSPITAL_COMMUNITY): Payer: Self-pay

## 2023-09-08 ENCOUNTER — Other Ambulatory Visit: Payer: Self-pay

## 2023-09-08 MED ORDER — TRAMADOL HCL 50 MG PO TABS
50.0000 mg | ORAL_TABLET | Freq: Four times a day (QID) | ORAL | 0 refills | Status: AC | PRN
  Filled 2023-09-08 (×2): qty 40, 10d supply, fill #0

## 2023-09-16 ENCOUNTER — Other Ambulatory Visit: Payer: Self-pay

## 2023-09-20 DIAGNOSIS — M5416 Radiculopathy, lumbar region: Secondary | ICD-10-CM | POA: Diagnosis not present

## 2023-09-22 ENCOUNTER — Other Ambulatory Visit (HOSPITAL_COMMUNITY): Payer: Self-pay

## 2023-09-22 ENCOUNTER — Other Ambulatory Visit: Payer: Self-pay

## 2023-09-22 NOTE — Progress Notes (Signed)
 Specialty Pharmacy Refill Coordination Note  Amy Hernandez is a 58 y.o. female contacted today regarding refills of specialty medication(s) Dolutegravir -lamiVUDine  (DOVATO )   Patient requested Delivery   Delivery date: 09/24/23   Verified address: [address] [place] [address]   Medication will be filled on 09/23/23.

## 2023-10-04 DIAGNOSIS — M5416 Radiculopathy, lumbar region: Secondary | ICD-10-CM | POA: Diagnosis not present

## 2023-10-12 ENCOUNTER — Other Ambulatory Visit: Payer: Self-pay | Admitting: Gastroenterology

## 2023-10-12 ENCOUNTER — Ambulatory Visit: Payer: Self-pay | Admitting: *Deleted

## 2023-10-12 ENCOUNTER — Encounter: Payer: Self-pay | Admitting: Internal Medicine

## 2023-10-12 ENCOUNTER — Other Ambulatory Visit: Payer: Self-pay

## 2023-10-12 ENCOUNTER — Other Ambulatory Visit (HOSPITAL_COMMUNITY): Payer: Self-pay

## 2023-10-12 ENCOUNTER — Telehealth (INDEPENDENT_AMBULATORY_CARE_PROVIDER_SITE_OTHER): Admitting: Family Medicine

## 2023-10-12 ENCOUNTER — Encounter: Payer: Self-pay | Admitting: Family Medicine

## 2023-10-12 DIAGNOSIS — L2089 Other atopic dermatitis: Secondary | ICD-10-CM | POA: Diagnosis not present

## 2023-10-12 DIAGNOSIS — S60562A Insect bite (nonvenomous) of left hand, initial encounter: Secondary | ICD-10-CM | POA: Diagnosis not present

## 2023-10-12 DIAGNOSIS — L299 Pruritus, unspecified: Secondary | ICD-10-CM | POA: Insufficient documentation

## 2023-10-12 DIAGNOSIS — M7989 Other specified soft tissue disorders: Secondary | ICD-10-CM

## 2023-10-12 DIAGNOSIS — W57XXXA Bitten or stung by nonvenomous insect and other nonvenomous arthropods, initial encounter: Secondary | ICD-10-CM | POA: Diagnosis not present

## 2023-10-12 MED ORDER — PREDNISONE 20 MG PO TABS
40.0000 mg | ORAL_TABLET | Freq: Every day | ORAL | 0 refills | Status: AC
  Filled 2023-10-12 (×2): qty 10, 5d supply, fill #0

## 2023-10-12 MED ORDER — TRIAMCINOLONE ACETONIDE 0.5 % EX OINT
1.0000 | TOPICAL_OINTMENT | Freq: Two times a day (BID) | CUTANEOUS | 0 refills | Status: AC
Start: 2023-10-12 — End: ?
  Filled 2023-10-12: qty 30, 30d supply, fill #0
  Filled 2023-10-12: qty 30, fill #0

## 2023-10-12 NOTE — Telephone Encounter (Signed)
 FYI Only or Action Required?: Action required by provider: update on patient condition and please advise if patient needs in person appt or if VV ok for today .  Patient was last seen in primary care on 07/26/2023 by Purcell Emil Schanz, MD.  Called Nurse Triage reporting Insect Bite.  Symptoms began a week ago.  Interventions attempted: OTC medications: benadryl.  Symptoms are: gradually worsening.  Triage Disposition: See PCP When Office is Open (Within 3 Days)  Patient/caregiver understands and will follow disposition?: Yes          Copied from CRM [id number]. Topic: Clinical - Red Word Triage >> Oct 12, 2023 11:14 AM Macario HERO wrote: Red Word that prompted transfer to Nurse Triage: Patient called and said something bit her left arm last week, it's welting, yesterday she felt numbness and took a Benadryl. The itching stopped and pain went into her back all night. Reason for Disposition  [1] SEVERE local itching (e.g., interferes with work, school, sleep) AND [2] not improved after 24 hours of hydrocortisone cream  Answer Assessment - Initial Assessment Questions Appt scheduled today with other provider, patient requesting VV. Will send photo via my chart. Please advise if patient needs to be seen in person. Patient concerned due to pain yesterday was in back and she has been working to decrease pain in back.      1. TYPE of INSECT: What type of insect was it?      Not sure  2. ONSET: When did you get bitten?      Last week  3. LOCATION: Where is the insect bite located?      Left hand  4. REDNESS: Is the area red or pink? If Yes, ask: What size is the area of redness? (inches or cm). When did the redness start?     [place] to left hand  5. PAIN: Is there any pain? If Yes, ask: How bad is the pain? (Scale 0-10; or none, mild, moderate, severe)     No pain  6. ITCHING: Does it itch? If Yes, ask: How bad is the itch?      Yes  7. SWELLING: How big is  the swelling? (e.g., inches, cm, or compare to coins)     no 8. OTHER SYMPTOMS: Do you have any other symptoms?  (e.g., difficulty breathing, fever, hives)     Left hand , arm numbness yesterday and pain went to back.  Numbness and pain now gone.  9. PREGNANCY: Is there any chance you are pregnant? When was your last menstrual period?     na  Protocols used: Insect Bite-A-AH

## 2023-10-12 NOTE — Telephone Encounter (Signed)
 Ok for any acute visit

## 2023-10-12 NOTE — Progress Notes (Signed)
 Virtual Visit via Video Note  I connected with Amy Hernandez on 10/22/23 at  1:40 PM EDT by a video enabled telemedicine application and verified that I am speaking with the correct person using two identifiers.  Patient Location: Home Provider Location: Office/Clinic  I discussed the limitations, risks, security, and privacy concerns of performing an evaluation and management service by video and the availability of in person appointments. I also discussed with the patient that there may be a patient responsible charge related to this service. The patient expressed understanding and agreed to proceed.  Subjective: PCP: Rollene Almarie LABOR, MD  Chief Complaint  Patient presents with   Acute Visit    Left hand, possible bug bite. First noticed itching over the weekend, yesterday started worsening. Has taken benadryl    HPI  58 yo F presents for AV visit for evaluation of bug bite to the left hand about 2 days ago. Reports that she has used Benadryl for itching and swelling with some relief. She did not see what bit her. Denies chills, fever, headaches, abdominal pain, nausea, vomiting, diarrhea, drainage from the area, bleeding from the area.   ROS: Per HPI  Current Outpatient Medications:    albuterol  (VENTOLIN  HFA) 108 (90 Base) MCG/ACT inhaler, Inhale 1 puff into the lungs every 6 (six) hours as needed for wheezing or shortness of breath., Disp: 8.5 g, Rfl: 3   atorvastatin  (LIPITOR) 40 MG tablet, Take 1 tablet (40 mg total) by mouth daily., Disp: 90 tablet, Rfl: 3   buPROPion  (WELLBUTRIN  XL) 150 MG 24 hr tablet, Take 1 tablet (150 mg total) by mouth daily., Disp: 30 tablet, Rfl: 5   colesevelam  (WELCHOL ) 625 MG tablet, Take 1 tablet by mouth 3 times daily., Disp: 90 tablet, Rfl: 11   cyclobenzaprine  (FLEXERIL ) 10 MG tablet, Take 1 tablet (10 mg total) by mouth 3 (three) times daily., Disp: 30 tablet, Rfl: 0   diazepam  (VALIUM ) 5 MG tablet, Take 1 tablet by mouth as a single  dose as directed 30 minutes before MRI. May repeat x1 if needed., Disp: 2 tablet, Rfl: 0   dolutegravir -lamiVUDine  (DOVATO ) 50-300 MG tablet, Take 1 tablet by mouth daily., Disp: 90 tablet, Rfl: 2   lisinopril -hydrochlorothiazide  (ZESTORETIC ) 20-25 MG tablet, Take 1 tablet by mouth daily., Disp: 90 tablet, Rfl: 3   meloxicam  (MOBIC ) 15 MG tablet, Take 1 tablet (15 mg total) by mouth daily with a meal for two weeks then take daily as needed., Disp: 30 tablet, Rfl: 0   naproxen  (NAPROSYN ) 500 MG tablet, Take 1 tablet (500 mg total) by mouth 2 (two) times daily as needed for pain., Disp: 60 tablet, Rfl: 2   omeprazole  (PRILOSEC) 40 MG capsule, Take 1 capsule (40 mg total) by mouth daily., Disp: 90 capsule, Rfl: 2   potassium chloride  SA (KLOR-CON  M) 20 MEQ tablet, Take 1 tablet (20 mEq total) by mouth daily., Disp: 90 tablet, Rfl: 3   traMADol  (ULTRAM ) 50 MG tablet, Take 1 tablet (50 mg total) by mouth every 6 (six) hours as needed for pain., Disp: 40 tablet, Rfl: 0   triamcinolone  ointment (KENALOG ) 0.5 %, Apply to affected area 2 (two) times daily., Disp: 30 g, Rfl: 0   zolpidem  (AMBIEN  CR) 12.5 MG CR tablet, Take 1 tablet (12.5 mg total) by mouth at bedtime as needed for sleep., Disp: 90 tablet, Rfl: 1   colchicine  0.6 MG tablet, Take 1 tablet by mouth twice a day with meals, Disp: 14 tablet, Rfl:  0  Observations/Objective: There were no vitals filed for this visit. Physical Exam Vitals and nursing note reviewed.  Constitutional:      General: She is not in acute distress. HENT:     Head: Normocephalic and atraumatic.  Eyes:     Extraocular Movements: Extraocular movements intact.  Pulmonary:     Effort: Pulmonary effort is normal.  Musculoskeletal:     Cervical back: Normal range of motion.  Skin:    Comments: See photo of left hand  Neurological:     General: No focal deficit present.     Mental Status: She is alert and oriented to person, place, and time.  Psychiatric:        Mood  and Affect: Mood normal.        Behavior: Behavior normal.        Assessment and Plan: Swelling of left hand -     Triamcinolone  Acetonide; Apply to affected area 2 (two) times daily.  Dispense: 30 g; Refill: 0 -     predniSONE ; Take 2 tablets (40 mg total) by mouth daily for 5 days.  Dispense: 10 tablet; Refill: 0  Bug bite, initial encounter -     Triamcinolone  Acetonide; Apply to affected area 2 (two) times daily.  Dispense: 30 g; Refill: 0 -     predniSONE ; Take 2 tablets (40 mg total) by mouth daily for 5 days.  Dispense: 10 tablet; Refill: 0  Other atopic dermatitis -     Triamcinolone  Acetonide; Apply to affected area 2 (two) times daily.  Dispense: 30 g; Refill: 0 -     predniSONE ; Take 2 tablets (40 mg total) by mouth daily for 5 days.  Dispense: 10 tablet; Refill: 0    Follow Up Instructions: Return if symptoms worsen or fail to improve.   I discussed the assessment and treatment plan with the patient. The patient was provided an opportunity to ask questions, and all were answered. The patient agreed with the plan and demonstrated an understanding of the instructions.   The patient was advised to call back or seek an in-person evaluation if the symptoms worsen or if the condition fails to improve as anticipated.  The above assessment and management plan was discussed with the patient. The patient verbalized understanding of and has agreed to the management plan.   Corean LITTIE Ku, FNP

## 2023-10-13 ENCOUNTER — Other Ambulatory Visit (HOSPITAL_COMMUNITY): Payer: Self-pay

## 2023-10-13 ENCOUNTER — Other Ambulatory Visit: Payer: Self-pay

## 2023-10-13 MED ORDER — COLCHICINE 0.6 MG PO TABS
0.6000 mg | ORAL_TABLET | Freq: Two times a day (BID) | ORAL | 0 refills | Status: DC
  Filled 2023-10-13: qty 14, 7d supply, fill #0

## 2023-10-13 NOTE — Telephone Encounter (Signed)
 Patient had a virtual yesterday and was treated

## 2023-10-17 DIAGNOSIS — L2089 Other atopic dermatitis: Secondary | ICD-10-CM | POA: Insufficient documentation

## 2023-10-17 NOTE — Patient Instructions (Signed)
 I have sent in triamcinolone  cream for you.  You may use this twice a day.  I have sent in prednisone  for you to take 2 tablets once daily in the morning with breakfast for the next 5 days.  Follow-up with me for new or worsening symptoms.

## 2023-10-20 ENCOUNTER — Other Ambulatory Visit (HOSPITAL_COMMUNITY): Payer: Self-pay

## 2023-10-28 DIAGNOSIS — M5416 Radiculopathy, lumbar region: Secondary | ICD-10-CM | POA: Diagnosis not present

## 2023-10-28 DIAGNOSIS — M48061 Spinal stenosis, lumbar region without neurogenic claudication: Secondary | ICD-10-CM | POA: Diagnosis not present

## 2023-11-09 DIAGNOSIS — M5416 Radiculopathy, lumbar region: Secondary | ICD-10-CM | POA: Diagnosis not present

## 2023-11-09 DIAGNOSIS — M48061 Spinal stenosis, lumbar region without neurogenic claudication: Secondary | ICD-10-CM | POA: Diagnosis not present

## 2023-11-18 ENCOUNTER — Other Ambulatory Visit (HOSPITAL_COMMUNITY): Payer: Self-pay

## 2023-11-19 ENCOUNTER — Other Ambulatory Visit: Payer: Self-pay

## 2023-11-28 ENCOUNTER — Other Ambulatory Visit: Payer: Self-pay | Admitting: Gastroenterology

## 2023-11-29 ENCOUNTER — Other Ambulatory Visit (HOSPITAL_COMMUNITY): Payer: Self-pay

## 2023-11-29 ENCOUNTER — Encounter (HOSPITAL_COMMUNITY): Payer: Self-pay

## 2023-11-29 ENCOUNTER — Other Ambulatory Visit: Payer: Self-pay | Admitting: Internal Medicine

## 2023-11-29 NOTE — Telephone Encounter (Unsigned)
 Copied from CRM [id number]. Topic: Clinical - Medication Refill >> Nov 29, 2023  8:44 AM Macario HERO wrote: Medication: colchicine  0.6 MG tablet [[id number]]  Has the patient contacted their pharmacy? No (Agent: If no, request that the patient contact the pharmacy for the refill. If patient does not wish to contact the pharmacy document the reason why and proceed with request.) (Agent: If yes, when and what did the pharmacy advise?)  This is the patient's preferred pharmacy:  [facility] [address] [place] [address] Phone: [phone] Fax: [phone]  Is this the correct pharmacy for this prescription? Yes If no, delete pharmacy and type the correct one.   Has the prescription been filled recently? Yes  Is the patient out of the medication? Yes  Has the patient been seen for an appointment in the last year OR does the patient have an upcoming appointment? Yes  Can we respond through MyChart? Yes  Agent: Please be advised that Rx refills may take up to 3 business days. We ask that you follow-up with your pharmacy.

## 2023-12-03 ENCOUNTER — Other Ambulatory Visit (HOSPITAL_BASED_OUTPATIENT_CLINIC_OR_DEPARTMENT_OTHER): Payer: Self-pay

## 2023-12-03 MED ORDER — COLCHICINE 0.6 MG PO TABS
0.6000 mg | ORAL_TABLET | Freq: Two times a day (BID) | ORAL | 0 refills | Status: DC
  Filled 2023-12-03: qty 14, 7d supply, fill #0

## 2023-12-06 ENCOUNTER — Other Ambulatory Visit: Payer: Self-pay

## 2023-12-08 ENCOUNTER — Encounter: Payer: Self-pay | Admitting: Internal Medicine

## 2023-12-08 ENCOUNTER — Other Ambulatory Visit: Payer: Self-pay | Admitting: Family Medicine

## 2023-12-08 ENCOUNTER — Other Ambulatory Visit (HOSPITAL_COMMUNITY): Payer: Self-pay

## 2023-12-08 ENCOUNTER — Ambulatory Visit: Admitting: Internal Medicine

## 2023-12-08 ENCOUNTER — Other Ambulatory Visit: Payer: Self-pay

## 2023-12-08 VITALS — BP 120/60 | HR 55 | Temp 97.9°F | Ht 64.0 in | Wt 154.0 lb

## 2023-12-08 DIAGNOSIS — M109 Gout, unspecified: Secondary | ICD-10-CM

## 2023-12-08 DIAGNOSIS — M545 Low back pain, unspecified: Secondary | ICD-10-CM

## 2023-12-08 DIAGNOSIS — M7989 Other specified soft tissue disorders: Secondary | ICD-10-CM

## 2023-12-08 DIAGNOSIS — W57XXXA Bitten or stung by nonvenomous insect and other nonvenomous arthropods, initial encounter: Secondary | ICD-10-CM

## 2023-12-08 DIAGNOSIS — L2089 Other atopic dermatitis: Secondary | ICD-10-CM

## 2023-12-08 MED ORDER — PREDNISONE 20 MG PO TABS
ORAL_TABLET | ORAL | 0 refills | Status: AC
  Filled 2023-12-08 (×2): qty 12, 7d supply, fill #0

## 2023-12-08 MED ORDER — ALLOPURINOL 100 MG PO TABS
100.0000 mg | ORAL_TABLET | Freq: Every day | ORAL | 6 refills | Status: AC
  Filled 2023-12-08 (×2): qty 30, 30d supply, fill #0
  Filled 2024-01-14: qty 30, 30d supply, fill #1
  Filled 2024-02-15: qty 30, 30d supply, fill #2

## 2023-12-08 MED ORDER — COLCHICINE 0.6 MG PO TABS
0.6000 mg | ORAL_TABLET | Freq: Every day | ORAL | 0 refills | Status: AC
  Filled 2023-12-08 (×2): qty 30, 30d supply, fill #0

## 2023-12-08 NOTE — Progress Notes (Signed)
 Subjective:   Patient ID: Amy Hernandez, female    DOB: 09-13-1965, 58 y.o.   MRN: [id number]  Discussed the use of AI scribe software for clinical note transcription with the patient, who gave verbal consent to proceed.  History of Present Illness Amy Hernandez is a 58 year old female with gout who presents with a gout flare-up in her right foot.  She has been experiencing a gout flare-up in her right foot, which began with tingling sensations two weeks ago on a Friday night. By Saturday, the pain became excruciating, and by Sunday, her foot and toes were swollen. Although the swelling has since decreased, she continues to experience significant tenderness and pain in the area.  She was previously diagnosed with gout and was prescribed colchicine  to manage flare-ups, taking it twice daily during episodes. However, colchicine  caused severe stomach discomfort, even when taken once. She was given a total of fifteen pills for flare-ups. She has been trying to manage her condition by monitoring her diet. She notes that the flare-ups have been occurring more frequently recently.  In addition to gout, she has a history of back issues, including arthritis, three herniated discs, and stenosis. She received a steroid injection in August for her back pain and spasms. However, the recent gout flare-up has hindered her ability to participate in physical therapy for her back.  Her current medications include colchicine , which she takes during flare-ups, although she is concerned about its gastrointestinal side effects.  Review of Systems  Constitutional: Negative.   HENT: Negative.    Eyes: Negative.   Respiratory:  Negative for cough, chest tightness and shortness of breath.   Cardiovascular:  Negative for chest pain, palpitations and leg swelling.  Gastrointestinal:  Negative for abdominal distention, abdominal pain, constipation, diarrhea, nausea and vomiting.  Musculoskeletal:  Positive for arthralgias  and back pain.  Skin: Negative.   Neurological: Negative.   Psychiatric/Behavioral: Negative.      Objective:  Physical Exam Constitutional:      Appearance: She is well-developed.  HENT:     Head: Normocephalic and atraumatic.  Cardiovascular:     Rate and Rhythm: Normal rate and regular rhythm.  Pulmonary:     Effort: Pulmonary effort is normal. No respiratory distress.     Breath sounds: Normal breath sounds. No wheezing or rales.  Abdominal:     General: Bowel sounds are normal. There is no distension.     Palpations: Abdomen is soft.     Tenderness: There is no abdominal tenderness.  Musculoskeletal:        General: Tenderness present.     Cervical back: Normal range of motion.  Skin:    General: Skin is warm and dry.  Neurological:     Mental Status: She is alert and oriented to person, place, and time.     Coordination: Coordination normal.     Vitals:   12/08/23 0941  BP: 120/60  Pulse: (!) 55  Temp: 97.9 F (36.6 C)  TempSrc: Oral  SpO2: 99%  Weight: 154 lb (69.9 kg)  Height: 5' 4 (1.626 m)    Assessment and Plan Assessment & Plan Gout flare, right foot   An acute gout flare in her right foot is due to elevated uric acid. Previous use of colchicine  caused gastrointestinal discomfort, so prednisone  is preferred for acute management. Allopurinol is initiated for long-term prevention, with colchicine  prescribed for first 30 days to prevent flare-ups. Prescribe prednisone  for one week and initiate allopurinol  for long-term prevention. Prescribe colchicine  0.6 mg once daily for the first month of allopurinol treatment. Advise her to contact the office if colchicine  causes intolerable gastrointestinal side effects.   Chronic low back pain with lumbar arthritis, herniated discs, and spinal stenosis   Her chronic low back pain is due to lumbar arthritis, herniated discs, and spinal stenosis. A previous injection provided relief, but gout flare-ups have interfered  with physical therapy. Continue current management as the injection was effective. Encourage resumption of physical therapy once the gout flare is managed.

## 2023-12-08 NOTE — Patient Instructions (Addendum)
 We will treat the gout with prednisone . Take 2 pills daily for days 1-4, then reduce to 1 pill daily for days 5-7.   We have also sent in allopurinol to take 1 pill daily starting today on. Come back for labs in 4 weeks so we can adjust the dose if we need to.  We have sent in colchicine  to take 1 pill daily only for the first month of being on allopurinol to help prevent a gout flare up

## 2023-12-09 DIAGNOSIS — M109 Gout, unspecified: Secondary | ICD-10-CM | POA: Insufficient documentation

## 2023-12-09 NOTE — Assessment & Plan Note (Signed)
 An acute gout flare in her right foot is due to elevated uric acid. Previous use of colchicine  caused gastrointestinal discomfort, so prednisone  is preferred for acute management. Allopurinol is initiated for long-term prevention, with colchicine  prescribed for first 30 days to prevent flare-ups. Prescribe prednisone  for one week and initiate allopurinol for long-term prevention. Prescribe colchicine  0.6 mg once daily for the first month of allopurinol treatment. Advise her to contact the office if colchicine  causes intolerable gastrointestinal side effects.

## 2023-12-09 NOTE — Assessment & Plan Note (Signed)
 Her chronic low back pain is due to lumbar arthritis, herniated discs, and spinal stenosis. A previous injection provided relief, but gout flare-ups have interfered with physical therapy. Continue current management as the injection was effective. Encourage resumption of physical therapy once the gout flare is managed.

## 2023-12-16 ENCOUNTER — Other Ambulatory Visit (HOSPITAL_COMMUNITY): Payer: Self-pay

## 2023-12-17 ENCOUNTER — Other Ambulatory Visit: Payer: Self-pay

## 2023-12-20 ENCOUNTER — Other Ambulatory Visit: Payer: Self-pay

## 2023-12-20 ENCOUNTER — Other Ambulatory Visit (HOSPITAL_COMMUNITY): Payer: Self-pay

## 2023-12-20 NOTE — Progress Notes (Signed)
 Specialty Pharmacy Refill Coordination Note  Spoke with Shikha Bibb is a 58 y.o. female contacted today regarding refills of specialty medication(s) Dolutegravir -lamiVUDine  (DOVATO )  Doses on hand: Pt unsure, 9 per [facility]  Patient requested: Delivery   Delivery date: 12/24/23   Verified address: [address] [place] [address]  Medication will be filled on 12/22/23

## 2023-12-21 ENCOUNTER — Other Ambulatory Visit: Payer: Self-pay

## 2024-01-14 ENCOUNTER — Other Ambulatory Visit (HOSPITAL_BASED_OUTPATIENT_CLINIC_OR_DEPARTMENT_OTHER): Payer: Self-pay

## 2024-01-14 ENCOUNTER — Other Ambulatory Visit (HOSPITAL_COMMUNITY): Payer: Self-pay

## 2024-01-18 ENCOUNTER — Other Ambulatory Visit (HOSPITAL_COMMUNITY): Payer: Self-pay

## 2024-01-18 ENCOUNTER — Other Ambulatory Visit: Payer: Self-pay

## 2024-02-15 ENCOUNTER — Other Ambulatory Visit (HOSPITAL_COMMUNITY): Payer: Self-pay

## 2024-02-15 ENCOUNTER — Other Ambulatory Visit: Payer: Self-pay | Admitting: Family

## 2024-02-15 ENCOUNTER — Other Ambulatory Visit: Payer: Self-pay | Admitting: Internal Medicine

## 2024-02-15 ENCOUNTER — Other Ambulatory Visit: Payer: Self-pay

## 2024-02-15 MED ORDER — ATORVASTATIN CALCIUM 40 MG PO TABS
40.0000 mg | ORAL_TABLET | Freq: Every day | ORAL | 3 refills | Status: AC
  Filled 2024-02-15: qty 30, 30d supply, fill #0

## 2024-02-15 MED ORDER — ZOLPIDEM TARTRATE ER 12.5 MG PO TBCR
12.5000 mg | EXTENDED_RELEASE_TABLET | Freq: Every evening | ORAL | 0 refills | Status: AC | PRN
  Filled 2024-02-15: qty 30, 30d supply, fill #0
